# Patient Record
Sex: Male | Born: 1960
Health system: Southern US, Community
[De-identification: ages and names within clinical notes are randomized; demographics above are authoritative.]

## PROBLEM LIST (undated history)

## (undated) DIAGNOSIS — K219 Gastro-esophageal reflux disease without esophagitis: Secondary | ICD-10-CM

## (undated) DIAGNOSIS — R1310 Dysphagia, unspecified: Secondary | ICD-10-CM

## (undated) DIAGNOSIS — K08109 Complete loss of teeth, unspecified cause, unspecified class: Secondary | ICD-10-CM

## (undated) DIAGNOSIS — R0600 Dyspnea, unspecified: Secondary | ICD-10-CM

## (undated) DIAGNOSIS — J302 Other seasonal allergic rhinitis: Secondary | ICD-10-CM

## (undated) DIAGNOSIS — M1990 Unspecified osteoarthritis, unspecified site: Secondary | ICD-10-CM

## (undated) DIAGNOSIS — C801 Malignant (primary) neoplasm, unspecified: Secondary | ICD-10-CM

## (undated) DIAGNOSIS — M542 Cervicalgia: Secondary | ICD-10-CM

## (undated) DIAGNOSIS — R519 Headache, unspecified: Secondary | ICD-10-CM

## (undated) DIAGNOSIS — E039 Hypothyroidism, unspecified: Secondary | ICD-10-CM

## (undated) DIAGNOSIS — Z923 Personal history of irradiation: Secondary | ICD-10-CM

## (undated) DIAGNOSIS — I10 Essential (primary) hypertension: Secondary | ICD-10-CM

## (undated) DIAGNOSIS — Z9221 Personal history of antineoplastic chemotherapy: Secondary | ICD-10-CM

## (undated) DIAGNOSIS — F419 Anxiety disorder, unspecified: Secondary | ICD-10-CM

## (undated) DIAGNOSIS — E785 Hyperlipidemia, unspecified: Secondary | ICD-10-CM

## (undated) DIAGNOSIS — G4730 Sleep apnea, unspecified: Secondary | ICD-10-CM

## (undated) DIAGNOSIS — E119 Type 2 diabetes mellitus without complications: Secondary | ICD-10-CM

## (undated) DIAGNOSIS — Z972 Presence of dental prosthetic device (complete) (partial): Secondary | ICD-10-CM

## (undated) HISTORY — PX: SHOULDER OPEN ROTATOR CUFF REPAIR: SHX2407

## (undated) HISTORY — PX: PORTACATH PLACEMENT: SHX2246

## (undated) HISTORY — DX: Anxiety disorder, unspecified: F41.9

## (undated) HISTORY — DX: Essential (primary) hypertension: I10

## (undated) HISTORY — PX: SHOULDER ARTHROSCOPY: SHX128

## (undated) HISTORY — PX: MULTIPLE TOOTH EXTRACTIONS: SHX2053

## (undated) HISTORY — PX: LYMPHADENECTOMY: SHX15

## (undated) HISTORY — DX: Gastro-esophageal reflux disease without esophagitis: K21.9

## (undated) HISTORY — PX: PORT-A-CATH REMOVAL: SHX5289

## (undated) HISTORY — DX: Malignant (primary) neoplasm, unspecified: C80.1

## (undated) HISTORY — PX: KNEE ARTHROPLASTY: SHX992

## (undated) HISTORY — DX: Hyperlipidemia, unspecified: E78.5

## (undated) HISTORY — PX: KNEE ARTHROSCOPY: SUR90

---

## 1997-12-14 ENCOUNTER — Encounter: Admission: RE | Admit: 1997-12-14 | Discharge: 1998-03-14 | Payer: Self-pay | Admitting: Internal Medicine

## 1998-01-01 ENCOUNTER — Ambulatory Visit: Admission: RE | Admit: 1998-01-01 | Discharge: 1998-01-01 | Payer: Self-pay | Admitting: Pulmonary Disease

## 1998-02-26 ENCOUNTER — Ambulatory Visit: Admission: RE | Admit: 1998-02-26 | Discharge: 1998-02-26 | Payer: Self-pay | Admitting: Pulmonary Disease

## 2001-02-27 ENCOUNTER — Encounter: Payer: Self-pay | Admitting: Orthopedic Surgery

## 2001-03-03 ENCOUNTER — Ambulatory Visit (HOSPITAL_COMMUNITY): Admission: RE | Admit: 2001-03-03 | Discharge: 2001-03-04 | Payer: Self-pay | Admitting: Orthopedic Surgery

## 2002-05-07 HISTORY — PX: LAPAROSCOPIC GASTRIC BYPASS: SUR771

## 2002-07-06 ENCOUNTER — Encounter: Payer: Self-pay | Admitting: Surgery

## 2002-07-06 ENCOUNTER — Ambulatory Visit (HOSPITAL_COMMUNITY): Admission: RE | Admit: 2002-07-06 | Discharge: 2002-07-06 | Payer: Self-pay | Admitting: Surgery

## 2002-07-06 ENCOUNTER — Encounter: Admission: RE | Admit: 2002-07-06 | Discharge: 2002-10-04 | Payer: Self-pay | Admitting: Surgery

## 2002-07-24 ENCOUNTER — Ambulatory Visit (HOSPITAL_BASED_OUTPATIENT_CLINIC_OR_DEPARTMENT_OTHER): Admission: RE | Admit: 2002-07-24 | Discharge: 2002-07-24 | Payer: Self-pay | Admitting: Surgery

## 2002-10-12 ENCOUNTER — Encounter: Admission: RE | Admit: 2002-10-12 | Discharge: 2003-01-10 | Payer: Self-pay | Admitting: Surgery

## 2002-10-26 ENCOUNTER — Inpatient Hospital Stay (HOSPITAL_COMMUNITY): Admission: RE | Admit: 2002-10-26 | Discharge: 2002-10-30 | Payer: Self-pay | Admitting: Surgery

## 2002-10-26 ENCOUNTER — Encounter: Payer: Self-pay | Admitting: Surgery

## 2002-10-27 ENCOUNTER — Encounter: Payer: Self-pay | Admitting: Surgery

## 2003-01-19 ENCOUNTER — Encounter: Admission: RE | Admit: 2003-01-19 | Discharge: 2003-04-19 | Payer: Self-pay | Admitting: Surgery

## 2003-05-08 DIAGNOSIS — C801 Malignant (primary) neoplasm, unspecified: Secondary | ICD-10-CM

## 2003-05-08 HISTORY — DX: Malignant (primary) neoplasm, unspecified: C80.1

## 2003-05-10 ENCOUNTER — Encounter: Admission: RE | Admit: 2003-05-10 | Discharge: 2003-05-10 | Payer: Self-pay | Admitting: Internal Medicine

## 2003-05-18 ENCOUNTER — Ambulatory Visit (HOSPITAL_COMMUNITY): Admission: RE | Admit: 2003-05-18 | Discharge: 2003-05-18 | Payer: Self-pay | Admitting: Otolaryngology

## 2003-05-18 ENCOUNTER — Ambulatory Visit (HOSPITAL_BASED_OUTPATIENT_CLINIC_OR_DEPARTMENT_OTHER): Admission: RE | Admit: 2003-05-18 | Discharge: 2003-05-18 | Payer: Self-pay | Admitting: Otolaryngology

## 2003-05-18 ENCOUNTER — Encounter (INDEPENDENT_AMBULATORY_CARE_PROVIDER_SITE_OTHER): Payer: Self-pay | Admitting: *Deleted

## 2003-05-24 ENCOUNTER — Ambulatory Visit: Admission: RE | Admit: 2003-05-24 | Discharge: 2003-08-22 | Payer: Self-pay | Admitting: Radiation Oncology

## 2003-05-27 ENCOUNTER — Encounter: Admission: RE | Admit: 2003-05-27 | Discharge: 2003-05-27 | Payer: Self-pay | Admitting: Dentistry

## 2003-06-01 ENCOUNTER — Ambulatory Visit (HOSPITAL_COMMUNITY): Admission: RE | Admit: 2003-06-01 | Discharge: 2003-06-01 | Payer: Self-pay | Admitting: Otolaryngology

## 2003-06-11 ENCOUNTER — Ambulatory Visit (HOSPITAL_COMMUNITY): Admission: RE | Admit: 2003-06-11 | Discharge: 2003-06-11 | Payer: Self-pay | Admitting: Surgery

## 2003-06-11 ENCOUNTER — Ambulatory Visit (HOSPITAL_BASED_OUTPATIENT_CLINIC_OR_DEPARTMENT_OTHER): Admission: RE | Admit: 2003-06-11 | Discharge: 2003-06-11 | Payer: Self-pay | Admitting: Surgery

## 2003-08-25 ENCOUNTER — Ambulatory Visit (HOSPITAL_BASED_OUTPATIENT_CLINIC_OR_DEPARTMENT_OTHER): Admission: RE | Admit: 2003-08-25 | Discharge: 2003-08-25 | Payer: Self-pay | Admitting: Surgery

## 2003-08-26 ENCOUNTER — Ambulatory Visit: Admission: RE | Admit: 2003-08-26 | Discharge: 2003-08-26 | Payer: Self-pay | Admitting: Radiation Oncology

## 2003-09-09 ENCOUNTER — Ambulatory Visit: Admission: RE | Admit: 2003-09-09 | Discharge: 2003-09-09 | Payer: Self-pay | Admitting: Radiation Oncology

## 2003-09-16 ENCOUNTER — Ambulatory Visit: Admission: RE | Admit: 2003-09-16 | Discharge: 2003-09-16 | Payer: Self-pay | Admitting: Radiation Oncology

## 2003-09-20 ENCOUNTER — Ambulatory Visit: Admission: RE | Admit: 2003-09-20 | Discharge: 2003-09-20 | Payer: Self-pay | Admitting: Radiation Oncology

## 2003-09-20 ENCOUNTER — Encounter (HOSPITAL_COMMUNITY): Admission: RE | Admit: 2003-09-20 | Discharge: 2003-09-20 | Payer: Self-pay | Admitting: Radiation Oncology

## 2003-09-23 ENCOUNTER — Ambulatory Visit: Admission: RE | Admit: 2003-09-23 | Discharge: 2003-09-23 | Payer: Self-pay | Admitting: Radiation Oncology

## 2003-09-27 ENCOUNTER — Ambulatory Visit (HOSPITAL_COMMUNITY): Admission: RE | Admit: 2003-09-27 | Discharge: 2003-09-27 | Payer: Self-pay | Admitting: Radiation Oncology

## 2003-09-30 ENCOUNTER — Ambulatory Visit: Admission: RE | Admit: 2003-09-30 | Discharge: 2003-09-30 | Payer: Self-pay | Admitting: Radiation Oncology

## 2003-10-07 ENCOUNTER — Ambulatory Visit: Admission: RE | Admit: 2003-10-07 | Discharge: 2003-10-07 | Payer: Self-pay | Admitting: Radiation Oncology

## 2003-11-11 ENCOUNTER — Ambulatory Visit: Admission: RE | Admit: 2003-11-11 | Discharge: 2003-11-11 | Payer: Self-pay | Admitting: Radiation Oncology

## 2003-12-02 ENCOUNTER — Ambulatory Visit: Admission: RE | Admit: 2003-12-02 | Discharge: 2003-12-02 | Payer: Self-pay | Admitting: Radiation Oncology

## 2003-12-23 ENCOUNTER — Ambulatory Visit: Admission: RE | Admit: 2003-12-23 | Discharge: 2003-12-23 | Payer: Self-pay | Admitting: Radiation Oncology

## 2003-12-27 ENCOUNTER — Ambulatory Visit (HOSPITAL_COMMUNITY): Admission: RE | Admit: 2003-12-27 | Discharge: 2003-12-27 | Payer: Self-pay | Admitting: Hematology & Oncology

## 2004-01-13 ENCOUNTER — Ambulatory Visit: Admission: RE | Admit: 2004-01-13 | Discharge: 2004-01-13 | Payer: Self-pay | Admitting: Radiation Oncology

## 2004-01-13 ENCOUNTER — Ambulatory Visit: Payer: Self-pay | Admitting: Dentistry

## 2004-02-10 ENCOUNTER — Ambulatory Visit: Payer: Self-pay | Admitting: Dentistry

## 2004-02-10 ENCOUNTER — Ambulatory Visit: Admission: RE | Admit: 2004-02-10 | Discharge: 2004-02-10 | Payer: Self-pay | Admitting: Radiation Oncology

## 2004-03-13 ENCOUNTER — Ambulatory Visit (HOSPITAL_COMMUNITY): Admission: RE | Admit: 2004-03-13 | Discharge: 2004-03-13 | Payer: Self-pay | Admitting: Hematology & Oncology

## 2004-03-21 ENCOUNTER — Ambulatory Visit: Admission: RE | Admit: 2004-03-21 | Discharge: 2004-03-21 | Payer: Self-pay | Admitting: Radiation Oncology

## 2004-03-22 ENCOUNTER — Inpatient Hospital Stay (HOSPITAL_COMMUNITY): Admission: RE | Admit: 2004-03-22 | Discharge: 2004-03-25 | Payer: Self-pay | Admitting: Orthopedic Surgery

## 2004-03-22 HISTORY — PX: REPLACEMENT TOTAL KNEE: SUR1224

## 2004-04-04 ENCOUNTER — Ambulatory Visit: Payer: Self-pay | Admitting: Dentistry

## 2004-04-06 ENCOUNTER — Ambulatory Visit: Payer: Self-pay | Admitting: Hematology & Oncology

## 2004-05-11 ENCOUNTER — Ambulatory Visit: Admission: RE | Admit: 2004-05-11 | Discharge: 2004-05-11 | Payer: Self-pay | Admitting: Radiation Oncology

## 2004-05-18 ENCOUNTER — Ambulatory Visit: Admission: RE | Admit: 2004-05-18 | Discharge: 2004-05-18 | Payer: Self-pay | Admitting: Radiation Oncology

## 2004-06-07 ENCOUNTER — Ambulatory Visit (HOSPITAL_COMMUNITY): Admission: RE | Admit: 2004-06-07 | Discharge: 2004-06-07 | Payer: Self-pay | Admitting: Hematology & Oncology

## 2004-06-30 ENCOUNTER — Ambulatory Visit: Payer: Self-pay | Admitting: Hematology & Oncology

## 2004-07-04 ENCOUNTER — Ambulatory Visit: Payer: Self-pay | Admitting: Dentistry

## 2004-07-13 ENCOUNTER — Ambulatory Visit: Admission: RE | Admit: 2004-07-13 | Discharge: 2004-07-13 | Payer: Self-pay | Admitting: Radiation Oncology

## 2004-08-03 ENCOUNTER — Ambulatory Visit: Admission: RE | Admit: 2004-08-03 | Discharge: 2004-08-03 | Payer: Self-pay | Admitting: Radiation Oncology

## 2004-08-25 ENCOUNTER — Ambulatory Visit: Payer: Self-pay | Admitting: Internal Medicine

## 2004-09-12 ENCOUNTER — Ambulatory Visit: Payer: Self-pay | Admitting: Dentistry

## 2004-10-25 ENCOUNTER — Ambulatory Visit (HOSPITAL_COMMUNITY): Admission: RE | Admit: 2004-10-25 | Discharge: 2004-10-25 | Payer: Self-pay | Admitting: Hematology & Oncology

## 2004-11-01 ENCOUNTER — Ambulatory Visit: Payer: Self-pay | Admitting: Hematology & Oncology

## 2004-11-02 ENCOUNTER — Ambulatory Visit: Admission: RE | Admit: 2004-11-02 | Discharge: 2004-11-02 | Payer: Self-pay | Admitting: Radiation Oncology

## 2004-12-08 ENCOUNTER — Ambulatory Visit: Payer: Self-pay | Admitting: Dentistry

## 2005-01-23 ENCOUNTER — Ambulatory Visit: Payer: Self-pay | Admitting: Dentistry

## 2005-03-07 ENCOUNTER — Ambulatory Visit (HOSPITAL_COMMUNITY): Admission: RE | Admit: 2005-03-07 | Discharge: 2005-03-07 | Payer: Self-pay | Admitting: Hematology & Oncology

## 2005-03-20 ENCOUNTER — Ambulatory Visit: Payer: Self-pay | Admitting: Hematology & Oncology

## 2005-04-09 ENCOUNTER — Ambulatory Visit: Payer: Self-pay | Admitting: Dentistry

## 2005-06-04 ENCOUNTER — Ambulatory Visit: Payer: Self-pay | Admitting: Hematology & Oncology

## 2005-07-11 ENCOUNTER — Ambulatory Visit (HOSPITAL_COMMUNITY): Admission: RE | Admit: 2005-07-11 | Discharge: 2005-07-11 | Payer: Self-pay | Admitting: Hematology & Oncology

## 2005-07-24 ENCOUNTER — Ambulatory Visit: Payer: Self-pay | Admitting: Hematology & Oncology

## 2005-07-27 ENCOUNTER — Ambulatory Visit: Payer: Self-pay | Admitting: Dentistry

## 2005-11-09 ENCOUNTER — Ambulatory Visit (HOSPITAL_COMMUNITY): Admission: RE | Admit: 2005-11-09 | Discharge: 2005-11-09 | Payer: Self-pay | Admitting: Hematology & Oncology

## 2005-11-20 ENCOUNTER — Ambulatory Visit: Payer: Self-pay | Admitting: Hematology & Oncology

## 2005-11-23 LAB — COMPREHENSIVE METABOLIC PANEL
Albumin: 4 g/dL (ref 3.5–5.2)
BUN: 24 mg/dL — ABNORMAL HIGH (ref 6–23)
CO2: 25 mEq/L (ref 19–32)
Glucose, Bld: 102 mg/dL — ABNORMAL HIGH (ref 70–99)
Sodium: 139 mEq/L (ref 135–145)
Total Bilirubin: 0.4 mg/dL (ref 0.3–1.2)
Total Protein: 7.2 g/dL (ref 6.0–8.3)

## 2005-11-23 LAB — TSH: TSH: 4.767 u[IU]/mL (ref 0.350–5.500)

## 2005-11-23 LAB — CBC WITH DIFFERENTIAL/PLATELET
Basophils Absolute: 0 10*3/uL (ref 0.0–0.1)
Eosinophils Absolute: 0.1 10*3/uL (ref 0.0–0.5)
HCT: 41.5 % (ref 38.7–49.9)
HGB: 14.1 g/dL (ref 13.0–17.1)
LYMPH%: 25.4 % (ref 14.0–48.0)
MONO#: 0.5 10*3/uL (ref 0.1–0.9)
NEUT#: 5.4 10*3/uL (ref 1.5–6.5)
Platelets: 231 10*3/uL (ref 145–400)
RBC: 4.71 10*6/uL (ref 4.20–5.71)
WBC: 8.1 10*3/uL (ref 4.0–10.0)

## 2005-12-14 ENCOUNTER — Ambulatory Visit: Payer: Self-pay | Admitting: Dentistry

## 2006-01-28 ENCOUNTER — Encounter: Admission: RE | Admit: 2006-01-28 | Discharge: 2006-01-28 | Payer: Self-pay | Admitting: Surgery

## 2006-01-29 ENCOUNTER — Encounter: Admission: RE | Admit: 2006-01-29 | Discharge: 2006-01-29 | Payer: Self-pay | Admitting: Surgery

## 2006-03-21 ENCOUNTER — Ambulatory Visit: Payer: Self-pay | Admitting: Internal Medicine

## 2006-03-22 ENCOUNTER — Ambulatory Visit: Payer: Self-pay | Admitting: Dentistry

## 2006-04-19 ENCOUNTER — Ambulatory Visit (HOSPITAL_COMMUNITY): Admission: RE | Admit: 2006-04-19 | Discharge: 2006-04-19 | Payer: Self-pay | Admitting: Hematology & Oncology

## 2006-05-01 ENCOUNTER — Ambulatory Visit: Payer: Self-pay | Admitting: Hematology & Oncology

## 2006-05-06 LAB — CBC WITH DIFFERENTIAL/PLATELET
Basophils Absolute: 0.1 10*3/uL (ref 0.0–0.1)
Eosinophils Absolute: 0.2 10*3/uL (ref 0.0–0.5)
HGB: 14.4 g/dL (ref 13.0–17.1)
LYMPH%: 32.1 % (ref 14.0–48.0)
MCV: 87.3 fL (ref 81.6–98.0)
MONO%: 8.4 % (ref 0.0–13.0)
NEUT#: 3.7 10*3/uL (ref 1.5–6.5)
Platelets: 184 10*3/uL (ref 145–400)

## 2006-05-06 LAB — COMPREHENSIVE METABOLIC PANEL
AST: 19 U/L (ref 0–37)
Albumin: 3.9 g/dL (ref 3.5–5.2)
Alkaline Phosphatase: 67 U/L (ref 39–117)
BUN: 18 mg/dL (ref 6–23)
Creatinine, Ser: 1.15 mg/dL (ref 0.40–1.50)
Glucose, Bld: 103 mg/dL — ABNORMAL HIGH (ref 70–99)
Total Bilirubin: 0.4 mg/dL (ref 0.3–1.2)

## 2006-05-13 ENCOUNTER — Ambulatory Visit: Payer: Self-pay | Admitting: Dentistry

## 2006-06-14 ENCOUNTER — Ambulatory Visit: Payer: Self-pay | Admitting: Hematology & Oncology

## 2006-06-18 LAB — TSH: TSH: 4.769 u[IU]/mL (ref 0.350–5.500)

## 2006-10-04 ENCOUNTER — Ambulatory Visit: Payer: Self-pay | Admitting: Dentistry

## 2006-10-28 ENCOUNTER — Ambulatory Visit: Payer: Self-pay | Admitting: Dentistry

## 2006-10-30 ENCOUNTER — Ambulatory Visit: Payer: Self-pay | Admitting: Hematology & Oncology

## 2006-10-30 ENCOUNTER — Ambulatory Visit (HOSPITAL_COMMUNITY): Admission: RE | Admit: 2006-10-30 | Discharge: 2006-10-30 | Payer: Self-pay | Admitting: Hematology & Oncology

## 2006-10-30 ENCOUNTER — Encounter: Payer: Self-pay | Admitting: Internal Medicine

## 2006-11-04 ENCOUNTER — Encounter: Payer: Self-pay | Admitting: Internal Medicine

## 2006-11-04 LAB — COMPREHENSIVE METABOLIC PANEL
Albumin: 4.1 g/dL (ref 3.5–5.2)
Alkaline Phosphatase: 73 U/L (ref 39–117)
CO2: 26 mEq/L (ref 19–32)
Glucose, Bld: 109 mg/dL — ABNORMAL HIGH (ref 70–99)
Potassium: 4.9 mEq/L (ref 3.5–5.3)
Sodium: 140 mEq/L (ref 135–145)
Total Protein: 7.5 g/dL (ref 6.0–8.3)

## 2006-11-04 LAB — CBC WITH DIFFERENTIAL/PLATELET
Eosinophils Absolute: 0.1 10*3/uL (ref 0.0–0.5)
MONO#: 0.5 10*3/uL (ref 0.1–0.9)
MONO%: 8.7 % (ref 0.0–13.0)
NEUT#: 3.2 10*3/uL (ref 1.5–6.5)
RBC: 4.92 10*6/uL (ref 4.20–5.71)
RDW: 14.8 % — ABNORMAL HIGH (ref 11.2–14.6)
WBC: 6.2 10*3/uL (ref 4.0–10.0)
lymph#: 2.3 10*3/uL (ref 0.9–3.3)

## 2006-11-11 ENCOUNTER — Ambulatory Visit: Payer: Self-pay | Admitting: Dentistry

## 2006-11-18 ENCOUNTER — Ambulatory Visit: Payer: Self-pay | Admitting: Dentistry

## 2007-01-09 ENCOUNTER — Ambulatory Visit: Payer: Self-pay | Admitting: Hematology & Oncology

## 2007-01-13 LAB — COMPREHENSIVE METABOLIC PANEL
ALT: 20 U/L (ref 0–53)
AST: 16 U/L (ref 0–37)
Alkaline Phosphatase: 65 U/L (ref 39–117)
CO2: 24 mEq/L (ref 19–32)
Sodium: 139 mEq/L (ref 135–145)
Total Bilirubin: 0.5 mg/dL (ref 0.3–1.2)
Total Protein: 7.3 g/dL (ref 6.0–8.3)

## 2007-01-13 LAB — CBC WITH DIFFERENTIAL/PLATELET
BASO%: 0.5 % (ref 0.0–2.0)
EOS%: 1.1 % (ref 0.0–7.0)
LYMPH%: 33.2 % (ref 14.0–48.0)
MCHC: 34.3 g/dL (ref 32.0–35.9)
MONO#: 0.7 10*3/uL (ref 0.1–0.9)
MONO%: 10 % (ref 0.0–13.0)
Platelets: 176 10*3/uL (ref 145–400)
RBC: 4.71 10*6/uL (ref 4.20–5.71)
WBC: 6.8 10*3/uL (ref 4.0–10.0)

## 2007-04-14 ENCOUNTER — Ambulatory Visit (HOSPITAL_COMMUNITY): Admission: RE | Admit: 2007-04-14 | Discharge: 2007-04-14 | Payer: Self-pay | Admitting: Hematology & Oncology

## 2007-04-17 ENCOUNTER — Ambulatory Visit: Payer: Self-pay | Admitting: Hematology & Oncology

## 2007-04-21 ENCOUNTER — Encounter: Payer: Self-pay | Admitting: Internal Medicine

## 2007-04-21 LAB — CBC WITH DIFFERENTIAL/PLATELET
BASO%: 0.6 % (ref 0.0–2.0)
Basophils Absolute: 0 10*3/uL (ref 0.0–0.1)
EOS%: 2.2 % (ref 0.0–7.0)
HCT: 42.5 % (ref 38.7–49.9)
HGB: 14.7 g/dL (ref 13.0–17.1)
LYMPH%: 38 % (ref 14.0–48.0)
MCH: 30.3 pg (ref 28.0–33.4)
MCHC: 34.7 g/dL (ref 32.0–35.9)
MCV: 87.4 fL (ref 81.6–98.0)
MONO%: 8.5 % (ref 0.0–13.0)
NEUT%: 50.7 % (ref 40.0–75.0)
Platelets: 187 10*3/uL (ref 145–400)
lymph#: 2.5 10*3/uL (ref 0.9–3.3)

## 2007-04-21 LAB — COMPREHENSIVE METABOLIC PANEL
AST: 16 U/L (ref 0–37)
BUN: 16 mg/dL (ref 6–23)
Calcium: 9.1 mg/dL (ref 8.4–10.5)
Chloride: 105 mEq/L (ref 96–112)
Creatinine, Ser: 1.12 mg/dL (ref 0.40–1.50)
Total Bilirubin: 0.4 mg/dL (ref 0.3–1.2)

## 2007-04-22 ENCOUNTER — Ambulatory Visit: Payer: Self-pay | Admitting: Dentistry

## 2007-06-09 ENCOUNTER — Ambulatory Visit: Payer: Self-pay | Admitting: Dentistry

## 2007-06-25 ENCOUNTER — Encounter: Payer: Self-pay | Admitting: Internal Medicine

## 2007-08-20 ENCOUNTER — Ambulatory Visit: Payer: Self-pay | Admitting: Dentistry

## 2007-10-09 ENCOUNTER — Ambulatory Visit (HOSPITAL_COMMUNITY): Admission: RE | Admit: 2007-10-09 | Discharge: 2007-10-09 | Payer: Self-pay | Admitting: Hematology & Oncology

## 2007-10-14 ENCOUNTER — Ambulatory Visit: Payer: Self-pay | Admitting: Hematology & Oncology

## 2007-10-16 ENCOUNTER — Encounter: Payer: Self-pay | Admitting: Internal Medicine

## 2007-10-16 LAB — COMPREHENSIVE METABOLIC PANEL
AST: 19 U/L (ref 0–37)
Alkaline Phosphatase: 60 U/L (ref 39–117)
BUN: 21 mg/dL (ref 6–23)
Creatinine, Ser: 1.15 mg/dL (ref 0.40–1.50)
Total Bilirubin: 0.6 mg/dL (ref 0.3–1.2)

## 2007-10-16 LAB — CBC WITH DIFFERENTIAL/PLATELET
Basophils Absolute: 0 10*3/uL (ref 0.0–0.1)
EOS%: 1.4 % (ref 0.0–7.0)
HCT: 44.5 % (ref 38.7–49.9)
HGB: 15.1 g/dL (ref 13.0–17.1)
MCH: 30 pg (ref 28.0–33.4)
MCV: 88.5 fL (ref 81.6–98.0)
MONO%: 7.9 % (ref 0.0–13.0)
NEUT%: 54.5 % (ref 40.0–75.0)
RDW: 14.2 % (ref 11.2–14.6)

## 2007-12-31 ENCOUNTER — Ambulatory Visit: Payer: Self-pay | Admitting: Dentistry

## 2008-03-02 ENCOUNTER — Ambulatory Visit: Payer: Self-pay | Admitting: Dentistry

## 2008-03-22 ENCOUNTER — Ambulatory Visit: Payer: Self-pay | Admitting: Dentistry

## 2008-03-23 ENCOUNTER — Ambulatory Visit: Payer: Self-pay | Admitting: Internal Medicine

## 2008-03-23 DIAGNOSIS — I10 Essential (primary) hypertension: Secondary | ICD-10-CM | POA: Insufficient documentation

## 2008-03-23 DIAGNOSIS — E8881 Metabolic syndrome: Secondary | ICD-10-CM | POA: Insufficient documentation

## 2008-03-23 DIAGNOSIS — E785 Hyperlipidemia, unspecified: Secondary | ICD-10-CM | POA: Insufficient documentation

## 2008-03-23 DIAGNOSIS — E1159 Type 2 diabetes mellitus with other circulatory complications: Secondary | ICD-10-CM | POA: Insufficient documentation

## 2008-03-23 DIAGNOSIS — E039 Hypothyroidism, unspecified: Secondary | ICD-10-CM | POA: Insufficient documentation

## 2008-03-23 DIAGNOSIS — E1169 Type 2 diabetes mellitus with other specified complication: Secondary | ICD-10-CM | POA: Insufficient documentation

## 2008-03-30 ENCOUNTER — Encounter (INDEPENDENT_AMBULATORY_CARE_PROVIDER_SITE_OTHER): Payer: Self-pay | Admitting: *Deleted

## 2008-04-08 ENCOUNTER — Ambulatory Visit: Payer: Self-pay | Admitting: Internal Medicine

## 2008-04-27 ENCOUNTER — Ambulatory Visit: Payer: Self-pay | Admitting: Hematology & Oncology

## 2008-04-28 ENCOUNTER — Encounter: Payer: Self-pay | Admitting: Internal Medicine

## 2008-04-28 LAB — COMPREHENSIVE METABOLIC PANEL
ALT: 21 U/L (ref 0–53)
AST: 15 U/L (ref 0–37)
Calcium: 9 mg/dL (ref 8.4–10.5)
Chloride: 102 mEq/L (ref 96–112)
Creatinine, Ser: 1.15 mg/dL (ref 0.40–1.50)
Potassium: 4.7 mEq/L (ref 3.5–5.3)
Sodium: 137 mEq/L (ref 135–145)

## 2008-04-28 LAB — CBC WITH DIFFERENTIAL (CANCER CENTER ONLY)
BASO%: 0.6 % (ref 0.0–2.0)
EOS%: 1.6 % (ref 0.0–7.0)
LYMPH%: 31.3 % (ref 14.0–48.0)
MCHC: 33.5 g/dL (ref 32.0–35.9)
MCV: 86 fL (ref 82–98)
MONO#: 0.5 10*3/uL (ref 0.1–0.9)
NEUT%: 59.5 % (ref 40.0–80.0)
Platelets: 154 10*3/uL (ref 145–400)
RDW: 13.3 % (ref 10.5–14.6)
WBC: 7.5 10*3/uL (ref 4.0–10.0)

## 2008-06-17 ENCOUNTER — Ambulatory Visit: Payer: Self-pay | Admitting: Internal Medicine

## 2008-06-17 LAB — CONVERTED CEMR LAB
BUN: 13 mg/dL (ref 6–23)
Hgb A1c MFr Bld: 6.1 % — ABNORMAL HIGH (ref 4.6–6.0)
LDL Cholesterol: 123 mg/dL — ABNORMAL HIGH (ref 0–99)
Potassium: 4.4 meq/L (ref 3.5–5.1)
Total CHOL/HDL Ratio: 5.6

## 2008-06-24 ENCOUNTER — Ambulatory Visit: Payer: Self-pay | Admitting: Internal Medicine

## 2008-07-22 ENCOUNTER — Telehealth (INDEPENDENT_AMBULATORY_CARE_PROVIDER_SITE_OTHER): Payer: Self-pay | Admitting: *Deleted

## 2008-07-22 ENCOUNTER — Ambulatory Visit: Payer: Self-pay | Admitting: Internal Medicine

## 2008-07-22 LAB — CONVERTED CEMR LAB
Nitrite: NEGATIVE
Protein, U semiquant: NEGATIVE
Urobilinogen, UA: 0.2
WBC Urine, dipstick: NEGATIVE

## 2008-07-28 ENCOUNTER — Ambulatory Visit: Payer: Self-pay | Admitting: Dentistry

## 2008-08-27 ENCOUNTER — Telehealth (INDEPENDENT_AMBULATORY_CARE_PROVIDER_SITE_OTHER): Payer: Self-pay | Admitting: *Deleted

## 2008-09-21 ENCOUNTER — Telehealth (INDEPENDENT_AMBULATORY_CARE_PROVIDER_SITE_OTHER): Payer: Self-pay | Admitting: *Deleted

## 2008-10-12 ENCOUNTER — Ambulatory Visit: Payer: Self-pay | Admitting: Hematology & Oncology

## 2008-10-14 ENCOUNTER — Encounter: Payer: Self-pay | Admitting: Internal Medicine

## 2008-10-14 LAB — COMPREHENSIVE METABOLIC PANEL
ALT: 24 U/L (ref 0–53)
AST: 17 U/L (ref 0–37)
Albumin: 4.1 g/dL (ref 3.5–5.2)
Calcium: 9.9 mg/dL (ref 8.4–10.5)
Chloride: 103 mEq/L (ref 96–112)
Creatinine, Ser: 1.25 mg/dL (ref 0.40–1.50)
Potassium: 5.1 mEq/L (ref 3.5–5.3)

## 2008-10-14 LAB — CBC WITH DIFFERENTIAL (CANCER CENTER ONLY)
BASO#: 0 10*3/uL (ref 0.0–0.2)
Eosinophils Absolute: 0.1 10*3/uL (ref 0.0–0.5)
HCT: 46.2 % (ref 38.7–49.9)
HGB: 15.3 g/dL (ref 13.0–17.1)
LYMPH#: 2.7 10*3/uL (ref 0.9–3.3)
MCH: 29.6 pg (ref 28.0–33.4)
MONO%: 7.5 % (ref 0.0–13.0)
NEUT#: 3.7 10*3/uL (ref 1.5–6.5)
NEUT%: 51.8 % (ref 40.0–80.0)
RBC: 5.16 10*6/uL (ref 4.20–5.70)

## 2008-12-13 ENCOUNTER — Ambulatory Visit: Payer: Self-pay | Admitting: Dentistry

## 2008-12-22 ENCOUNTER — Ambulatory Visit: Payer: Self-pay | Admitting: Internal Medicine

## 2008-12-24 ENCOUNTER — Telehealth (INDEPENDENT_AMBULATORY_CARE_PROVIDER_SITE_OTHER): Payer: Self-pay | Admitting: *Deleted

## 2008-12-26 LAB — CONVERTED CEMR LAB
Cholesterol: 202 mg/dL — ABNORMAL HIGH (ref 0–200)
Direct LDL: 144.8 mg/dL
Hgb A1c MFr Bld: 5.9 % (ref 4.6–6.5)
TSH: 2.79 microintl units/mL (ref 0.35–5.50)
Total CHOL/HDL Ratio: 5
VLDL: 25.6 mg/dL (ref 0.0–40.0)

## 2008-12-27 ENCOUNTER — Encounter (INDEPENDENT_AMBULATORY_CARE_PROVIDER_SITE_OTHER): Payer: Self-pay | Admitting: *Deleted

## 2008-12-29 ENCOUNTER — Ambulatory Visit: Payer: Self-pay | Admitting: Dentistry

## 2009-01-13 ENCOUNTER — Encounter: Admission: RE | Admit: 2009-01-13 | Discharge: 2009-01-13 | Payer: Self-pay | Admitting: Orthopedic Surgery

## 2009-01-18 ENCOUNTER — Ambulatory Visit: Payer: Self-pay | Admitting: Internal Medicine

## 2009-01-18 DIAGNOSIS — K219 Gastro-esophageal reflux disease without esophagitis: Secondary | ICD-10-CM | POA: Insufficient documentation

## 2009-01-18 DIAGNOSIS — N400 Enlarged prostate without lower urinary tract symptoms: Secondary | ICD-10-CM | POA: Insufficient documentation

## 2009-01-18 DIAGNOSIS — R351 Nocturia: Secondary | ICD-10-CM | POA: Insufficient documentation

## 2009-01-20 ENCOUNTER — Encounter (INDEPENDENT_AMBULATORY_CARE_PROVIDER_SITE_OTHER): Payer: Self-pay | Admitting: *Deleted

## 2009-02-21 ENCOUNTER — Ambulatory Visit: Payer: Self-pay | Admitting: Family Medicine

## 2009-02-21 LAB — CONVERTED CEMR LAB
Ketones, urine, test strip: NEGATIVE
Nitrite: NEGATIVE
Urobilinogen, UA: 1
pH: 6.5

## 2009-02-22 ENCOUNTER — Encounter: Payer: Self-pay | Admitting: Family Medicine

## 2009-02-22 LAB — CONVERTED CEMR LAB

## 2009-02-24 ENCOUNTER — Telehealth (INDEPENDENT_AMBULATORY_CARE_PROVIDER_SITE_OTHER): Payer: Self-pay | Admitting: *Deleted

## 2009-03-02 ENCOUNTER — Ambulatory Visit: Payer: Self-pay | Admitting: Dentistry

## 2009-04-19 ENCOUNTER — Ambulatory Visit: Payer: Self-pay | Admitting: Internal Medicine

## 2009-04-26 ENCOUNTER — Ambulatory Visit: Payer: Self-pay | Admitting: Internal Medicine

## 2009-04-28 ENCOUNTER — Telehealth (INDEPENDENT_AMBULATORY_CARE_PROVIDER_SITE_OTHER): Payer: Self-pay | Admitting: *Deleted

## 2009-06-13 ENCOUNTER — Ambulatory Visit: Payer: Self-pay | Admitting: Dentistry

## 2009-06-29 ENCOUNTER — Telehealth (INDEPENDENT_AMBULATORY_CARE_PROVIDER_SITE_OTHER): Payer: Self-pay | Admitting: *Deleted

## 2009-07-27 ENCOUNTER — Ambulatory Visit: Payer: Self-pay | Admitting: Dentistry

## 2009-08-05 ENCOUNTER — Telehealth (INDEPENDENT_AMBULATORY_CARE_PROVIDER_SITE_OTHER): Payer: Self-pay | Admitting: *Deleted

## 2009-08-29 ENCOUNTER — Telehealth (INDEPENDENT_AMBULATORY_CARE_PROVIDER_SITE_OTHER): Payer: Self-pay | Admitting: *Deleted

## 2009-09-01 ENCOUNTER — Ambulatory Visit: Payer: Self-pay | Admitting: Internal Medicine

## 2009-09-05 LAB — CONVERTED CEMR LAB
BUN: 15 mg/dL (ref 6–23)
Cholesterol: 148 mg/dL (ref 0–200)
Creatinine, Ser: 1.2 mg/dL (ref 0.4–1.5)
Creatinine,U: 162.8 mg/dL
LDL Cholesterol: 79 mg/dL (ref 0–99)
Microalb Creat Ratio: 3.7 mg/g (ref 0.0–30.0)
Microalb, Ur: 0.6 mg/dL (ref 0.0–1.9)
Triglycerides: 125 mg/dL (ref 0.0–149.0)

## 2009-09-22 ENCOUNTER — Telehealth (INDEPENDENT_AMBULATORY_CARE_PROVIDER_SITE_OTHER): Payer: Self-pay | Admitting: *Deleted

## 2009-10-11 ENCOUNTER — Ambulatory Visit: Payer: Self-pay | Admitting: Dentistry

## 2009-11-09 ENCOUNTER — Telehealth (INDEPENDENT_AMBULATORY_CARE_PROVIDER_SITE_OTHER): Payer: Self-pay | Admitting: *Deleted

## 2009-11-10 ENCOUNTER — Telehealth (INDEPENDENT_AMBULATORY_CARE_PROVIDER_SITE_OTHER): Payer: Self-pay | Admitting: *Deleted

## 2009-11-28 ENCOUNTER — Telehealth (INDEPENDENT_AMBULATORY_CARE_PROVIDER_SITE_OTHER): Payer: Self-pay | Admitting: *Deleted

## 2010-01-10 ENCOUNTER — Telehealth (INDEPENDENT_AMBULATORY_CARE_PROVIDER_SITE_OTHER): Payer: Self-pay | Admitting: *Deleted

## 2010-04-03 ENCOUNTER — Telehealth (INDEPENDENT_AMBULATORY_CARE_PROVIDER_SITE_OTHER): Payer: Self-pay | Admitting: *Deleted

## 2010-05-07 HISTORY — PX: COLONOSCOPY: SHX174

## 2010-05-18 ENCOUNTER — Telehealth (INDEPENDENT_AMBULATORY_CARE_PROVIDER_SITE_OTHER): Payer: Self-pay | Admitting: *Deleted

## 2010-05-28 ENCOUNTER — Encounter: Payer: Self-pay | Admitting: Hematology & Oncology

## 2010-06-04 LAB — CONVERTED CEMR LAB
AST: 20 units/L (ref 0–37)
Albumin: 3.6 g/dL (ref 3.5–5.2)
Alkaline Phosphatase: 61 units/L (ref 39–117)
BUN: 20 mg/dL (ref 6–23)
Microalb Creat Ratio: 1.9 mg/g (ref 0.0–30.0)
Microalb, Ur: 0.2 mg/dL (ref 0.0–1.9)
Potassium: 4.3 meq/L (ref 3.5–5.1)

## 2010-06-05 ENCOUNTER — Telehealth (INDEPENDENT_AMBULATORY_CARE_PROVIDER_SITE_OTHER): Payer: Self-pay | Admitting: *Deleted

## 2010-06-06 NOTE — Progress Notes (Signed)
Summary: Refill Request  Phone Note Refill Request Call back at [phone] Message from:  Pharmacy on Sep 22, 2009 9:08 AM  Refills Requested: Medication #1:  AMLODIPINE BESY-BENAZEPRIL HCL 5-20 MG CAPS 1 once daily   Dosage confirmed as above?Dosage Confirmed   Supply Requested: 1 month   Last Refilled: 08/23/2009 [facility]  Next Appointment Scheduled: none Initial call taken by: Harold Barban,  Sep 22, 2009 9:08 AM    Prescriptions: AMLODIPINE BESY-BENAZEPRIL HCL 5-20 MG CAPS (AMLODIPINE BESY-BENAZEPRIL HCL) 1 once daily  #30 x 5   Entered by:   Shonna Chock   Authorized by:   Marga Melnick MD   Signed by:   Shonna Chock on 09/22/2009   Method used:   Electronically to        [facility]* (retail)       [address] [place], [place]  [address]       Ph: [phone] or [phone]       Fax: [phone]   RxID:   [id number]

## 2010-06-06 NOTE — Progress Notes (Signed)
Summary: REFILL  Phone Note Refill Request Message from:  Patient on April 03, 2010 9:27 AM  Refills Requested: Medication #1:  AMLODIPINE BESY-BENAZEPRIL HCL 5-20 MG CAPS 1 once daily   Dosage confirmed as above?Dosage Confirmed   Supply Requested: 1 month   Last Refilled: 02/24/2010 [facility]  Next Appointment Scheduled: 1.5.12 Initial call taken by: Lavell Islam,  April 03, 2010 9:27 AM    Prescriptions: AMLODIPINE BESY-BENAZEPRIL HCL 5-20 MG CAPS (AMLODIPINE BESY-BENAZEPRIL HCL) 1 once daily  #30 x 1   Entered by:   Shonna Chock CMA   Authorized by:   Marga Melnick MD   Signed by:   Shonna Chock CMA on 04/03/2010   Method used:   Electronically to        [facility]* (retail)       [address] [place], [place]  [address]       Ph: [phone] or [phone]       Fax: [phone]   RxID:   [id number]

## 2010-06-06 NOTE — Progress Notes (Signed)
Summary: REFILL REQUEST  Phone Note Refill Request Call back at [phone] Message from:  Pharmacy on November 09, 2009 8:55 AM  Refills Requested: Medication #1:  PRAVASTATIN SODIUM 20 MG TABS 1 at bedtime   Dosage confirmed as above?Dosage Confirmed   Supply Requested: 3 months   Last Refilled: 08/05/2009 [facility]  Next Appointment Scheduled: NONE Initial call taken by: Lavell Islam,  November 09, 2009 8:55 AM    Prescriptions: PRAVASTATIN SODIUM 20 MG TABS (PRAVASTATIN SODIUM) 1 at bedtime, APPOINTMENT DUE  #90 x 3   Entered by:   Shonna Chock   Authorized by:   Marga Melnick MD   Signed by:   Shonna Chock on 11/09/2009   Method used:   Electronically to        [facility]* (retail)       [address] [place], [place]  [address]       Ph: [phone] or [phone]       Fax: [phone]   RxID:   [id number]

## 2010-06-06 NOTE — Letter (Signed)
Summary: Results Follow up Letter  [facility]  [address] [place], [place] [address]   Phone: [phone]  Fax: [phone]    03/30/2008 MRN: [id number]  [facility] [address] [place], [place]  [address]  Dear Mr. Rackers,  The following are the results of your recent test(s):  Test         Result    Pap Smear:        Normal _____  Not Normal _____ Comments: ______________________________________________________ Cholesterol: LDL(Bad cholesterol):         Your goal is less than:         HDL (Good cholesterol):       Your goal is more than: Comments:  ______________________________________________________ Mammogram:        Normal _____  Not Normal _____ Comments:  ___________________________________________________________________ Hemoccult:        Normal _____  Not normal _______ Comments:    _____________________________________________________________________ Other Tests: PLEASE SEE ATTACHED LABS FROM 03/23/08 AND COMMENTS    We routinely do not discuss normal results over the telephone.  If you desire a copy of the results, or you have any questions about this information we can discuss them at your next office visit.   Sincerely,

## 2010-06-06 NOTE — Progress Notes (Signed)
Summary: Refill Request  Phone Note Refill Request Call back at [phone] Message from:  Pharmacy on November 28, 2009 3:32 PM  Refills Requested: Medication #1:  PANTOPRAZOLE SODIUM 40 MG TBEC 1 once daily as needed   Dosage confirmed as above?Dosage Confirmed   Supply Requested: 3 months   Last Refilled: 08/29/2009 [facility]  Next Appointment Scheduled: none Initial call taken by: Lavell Islam,  November 28, 2009 3:33 PM    Prescriptions: PANTOPRAZOLE SODIUM 40 MG TBEC (PANTOPRAZOLE SODIUM) 1 once daily as needed  #90 x 2   Entered by:   Shonna Chock CMA   Authorized by:   Marga Melnick MD   Signed by:   Shonna Chock CMA on 11/28/2009   Method used:   Electronically to        [facility]* (retail)       [address] [place], [place]  [address]       Ph: [phone] or [phone]       Fax: [phone]   RxID:   [id number]

## 2010-06-06 NOTE — Progress Notes (Signed)
Summary: Refill Request  Phone Note Refill Request Message from:  Pharmacy on [facility] Fax #: [phone]  Refills Requested: Medication #1:  PRAVASTATIN SODIUM 20 MG TABS 1 at bedtime.   Dosage confirmed as above?Dosage Confirmed   Supply Requested: 3 months   Last Refilled: 04/28/2009 Next Appointment Scheduled: none Initial call taken by: Harold Barban,  August 05, 2009 8:59 AM    New/Updated Medications: PRAVASTATIN SODIUM 20 MG TABS (PRAVASTATIN SODIUM) 1 at bedtime, APPOINTMENT DUE Prescriptions: PRAVASTATIN SODIUM 20 MG TABS (PRAVASTATIN SODIUM) 1 at bedtime, APPOINTMENT DUE  #90 x 0   Entered by:   Shonna Chock   Authorized by:   Marga Melnick MD   Signed by:   Shonna Chock on 08/05/2009   Method used:   Electronically to        [facility]* (retail)       [address] [place], [place]  [address]       Ph: [phone] or [phone]       Fax: [phone]   RxID:   [id number]

## 2010-06-06 NOTE — Progress Notes (Signed)
Summary: labs scheduled [phone]  Phone Note Refill Request Message from:  Pharmacy on [facility] Fax #: [phone]  Refills Requested: Medication #1:  PANTOPRAZOLE SODIUM 40 MG TBEC 1 once daily as needed   Dosage confirmed as above?Dosage Confirmed   Supply Requested: 3 months   Last Refilled: 05/24/2009 Next Appointment Scheduled: none Initial call taken by: Harold Barban,  August 29, 2009 9:16 AM  Follow-up for Phone Call        Based on appointment from 04/2009 patient was to follow-up in 4 months-April 2011 (no pending appointment), Please call and schedule a follow-up appointment and labs prior (see OV from 04/2009)  Follow-up by: Shonna Chock,  August 29, 2009 10:43 AM    New/Updated Medications: PANTOPRAZOLE SODIUM 40 MG TBEC (PANTOPRAZOLE SODIUM) 1 once daily as needed Prescriptions: PANTOPRAZOLE SODIUM 40 MG TBEC (PANTOPRAZOLE SODIUM) 1 once daily as needed  #90 x 0   Entered by:   Shonna Chock   Authorized by:   Marga Melnick MD   Signed by:   Shonna Chock on 08/29/2009   Method used:   Electronically to        [facility]* (retail)       [address] [place], [place]  [address]       Ph: [phone] or [phone]       Fax: [phone]   RxID:   [id number]

## 2010-06-06 NOTE — Progress Notes (Signed)
Summary: Refill Request  Phone Note Refill Request Message from:  Pharmacy on [facility] Fax #: [phone]  Refills Requested: Medication #1:  BUDEPRION XL 150 MG XR24H-TAB 2 by mouth once daily   Dosage confirmed as above?Dosage Confirmed   Supply Requested: 1 month   Last Refilled: 05/24/2009 Initial call taken by: Harold Barban,  June 29, 2009 10:13 AM    Prescriptions: BUDEPRION XL 150 MG XR24H-TAB (BUPROPION HCL) 2 by mouth once daily  #60 x 5   Entered by:   Shonna Chock   Authorized by:   Marga Melnick MD   Signed by:   Shonna Chock on 06/29/2009   Method used:   Electronically to        [facility]* (retail)       [address] [place], [place]  [address]       Ph: [phone] or [phone]       Fax: [phone]   RxID:   [id number]

## 2010-06-06 NOTE — Progress Notes (Signed)
Summary: refill  Phone Note Refill Request Message from:  Fax from Pharmacy on January 10, 2010 4:07 PM  Refills Requested: Medication #1:  BUDEPRION XL 150 MG XR24H-TAB 2 by mouth once daily [facility] - fax [phone]  Initial call taken by: Okey Regal [place],  January 10, 2010 4:08 PM  Follow-up for Phone Call        Called the pharmacy to cancel RX for Levothyroid, sent in error Follow-up by: Shonna Chock CMA,  January 10, 2010 4:18 PM    Prescriptions: BUDEPRION XL 150 MG XR24H-TAB (BUPROPION HCL) 2 by mouth once daily  #60 x 3   Entered by:   Shonna Chock CMA   Authorized by:   Marga Melnick MD   Signed by:   Shonna Chock CMA on 01/10/2010   Method used:   Electronically to        [facility]* (retail)       [address] [place], [place]  [address]       Ph: [phone] or [phone]       Fax: [phone]   RxID:   [id number] LEVOTHROID 175 MCG TABS (LEVOTHYROXINE SODIUM) 1 by mouth once daily  #30 x 3   Entered by:   Shonna Chock CMA   Authorized by:   Marga Melnick MD   Signed by:   Shonna Chock CMA on 01/10/2010   Method used:   Electronically to        [facility]* (retail)       [address] [place], [place]  [address]       Ph: [phone] or [phone]       Fax: [phone]   RxID:   [id number]

## 2010-06-06 NOTE — Progress Notes (Signed)
Summary: refill  Phone Note Refill Request Message from:  Fax from Pharmacy on November 10, 2009 11:29 AM  Refills Requested: Medication #1:  LEVOTHROID 175 MCG TABS 1 by mouth once daily [facility] - fax [phone] Jani Files [phone]  Next Appointment Scheduled: none Initial call taken by: Okey Regal Spring,  November 10, 2009 11:30 AM    Prescriptions: LEVOTHROID 175 MCG TABS (LEVOTHYROXINE SODIUM) 1 by mouth once daily  #30 x 11   Entered by:   Shonna Chock   Authorized by:   Marga Melnick MD   Signed by:   Shonna Chock on 11/10/2009   Method used:   Electronically to        [facility]* (retail)       [address] [place], [place]  [address]       Ph: [phone] or [phone]       Fax: [phone]   RxID:   [id number]

## 2010-06-08 NOTE — Progress Notes (Signed)
Summary: Bupropion refill  Phone Note Refill Request Message from:  Fax from Pharmacy on May 18, 2010 10:03 AM  Refills Requested: Medication #1:  BUDEPRION XL 150 MG XR24H-TAB 2 by mouth once daily   Last Refilled: 04/17/2010 [facility], [address], [place], [place]     phone [phone],   fax = [phone]    qty = 60  Next Appointment Scheduled: Tues 3/13   [place] Initial call taken by: Jerolyn Shin,  May 18, 2010 10:04 AM    Prescriptions: BUDEPRION XL 150 MG XR24H-TAB (BUPROPION HCL) 2 by mouth once daily  #60 x 2   Entered by:   Shonna Chock CMA   Authorized by:   Marga Melnick MD   Signed by:   Shonna Chock CMA on 05/18/2010   Method used:   Electronically to        [facility]* (retail)       [address] [place], [place]  [address]       Ph: [phone] or [phone]       Fax: [phone]   RxID:   [id number]

## 2010-06-14 NOTE — Progress Notes (Signed)
Summary: Refill Request  Phone Note Refill Request Call back at [phone] Message from:  Pharmacy on June 05, 2010 9:22 AM  Refills Requested: Medication #1:  AMLODIPINE BESY-BENAZEPRIL HCL 5-20 MG CAPS 1 once daily   Dosage confirmed as above?Dosage Confirmed   Supply Requested: 30   Last Refilled: 05/02/2010 [facility]  Next Appointment Scheduled: 3.13.12 Initial call taken by: Harold Barban,  June 05, 2010 9:23 AM    Prescriptions: AMLODIPINE BESY-BENAZEPRIL HCL 5-20 MG CAPS (AMLODIPINE BESY-BENAZEPRIL HCL) 1 once daily  #30 x 0   Entered by:   Shonna Chock CMA   Authorized by:   Marga Melnick MD   Signed by:   Shonna Chock CMA on 06/05/2010   Method used:   Electronically to        [facility]* (retail)       [address] [place], [place]  [address]       Ph: [phone] or [phone]       Fax: [phone]   RxID:   [id number]

## 2010-07-10 ENCOUNTER — Telehealth (INDEPENDENT_AMBULATORY_CARE_PROVIDER_SITE_OTHER): Payer: Self-pay | Admitting: *Deleted

## 2010-07-18 ENCOUNTER — Encounter (INDEPENDENT_AMBULATORY_CARE_PROVIDER_SITE_OTHER): Payer: BC Managed Care – PPO | Admitting: Internal Medicine

## 2010-07-18 ENCOUNTER — Encounter: Payer: Self-pay | Admitting: Internal Medicine

## 2010-07-18 ENCOUNTER — Encounter (INDEPENDENT_AMBULATORY_CARE_PROVIDER_SITE_OTHER): Payer: Self-pay | Admitting: *Deleted

## 2010-07-18 ENCOUNTER — Other Ambulatory Visit: Payer: Self-pay | Admitting: Internal Medicine

## 2010-07-18 DIAGNOSIS — Z1211 Encounter for screening for malignant neoplasm of colon: Secondary | ICD-10-CM

## 2010-07-18 DIAGNOSIS — E785 Hyperlipidemia, unspecified: Secondary | ICD-10-CM

## 2010-07-18 DIAGNOSIS — E8881 Metabolic syndrome: Secondary | ICD-10-CM

## 2010-07-18 DIAGNOSIS — E039 Hypothyroidism, unspecified: Secondary | ICD-10-CM

## 2010-07-18 DIAGNOSIS — Z0000 Encounter for general adult medical examination without abnormal findings: Secondary | ICD-10-CM

## 2010-07-18 DIAGNOSIS — I10 Essential (primary) hypertension: Secondary | ICD-10-CM

## 2010-07-18 DIAGNOSIS — K219 Gastro-esophageal reflux disease without esophagitis: Secondary | ICD-10-CM

## 2010-07-18 DIAGNOSIS — Z8249 Family history of ischemic heart disease and other diseases of the circulatory system: Secondary | ICD-10-CM

## 2010-07-18 LAB — LIPID PANEL
Cholesterol: 172 mg/dL (ref 0–200)
HDL: 40.2 mg/dL (ref >39.00)
Triglycerides: 178 mg/dL — ABNORMAL HIGH (ref 0.0–149.0)
VLDL: 35.6 mg/dL (ref 0.0–40.0)

## 2010-07-18 LAB — BASIC METABOLIC PANEL
CO2: 29 mEq/L (ref 19–32)
Calcium: 9.3 mg/dL (ref 8.4–10.5)
Chloride: 101 mEq/L (ref 96–112)
Creatinine, Ser: 1.3 mg/dL (ref 0.4–1.5)
Sodium: 138 mEq/L (ref 135–145)

## 2010-07-18 LAB — CBC WITH DIFFERENTIAL/PLATELET
Basophils Relative: 0.4 % (ref 0.0–3.0)
Eosinophils Relative: 0.9 % (ref 0.0–5.0)
Hemoglobin: 15.7 g/dL (ref 13.0–17.0)
Lymphocytes Relative: 38.2 % (ref 12.0–46.0)
Neutro Abs: 4.3 10*3/uL (ref 1.4–7.7)
Neutrophils Relative %: 52.6 % (ref 43.0–77.0)
RBC: 5.16 Mil/uL (ref 4.22–5.81)
WBC: 8.2 10*3/uL (ref 4.5–10.5)

## 2010-07-18 LAB — CONVERTED CEMR LAB
Ketones, urine, test strip: NEGATIVE
Nitrite: NEGATIVE
Protein, U semiquant: NEGATIVE
Specific Gravity, Urine: 1.01
Urobilinogen, UA: 0.2

## 2010-07-18 LAB — HEPATIC FUNCTION PANEL
ALT: 31 U/L (ref 0–53)
AST: 22 U/L (ref 0–37)
Albumin: 4.2 g/dL (ref 3.5–5.2)
Alkaline Phosphatase: 73 U/L (ref 39–117)
Bilirubin, Direct: 0.2 mg/dL (ref 0.0–0.3)
Total Protein: 7.7 g/dL (ref 6.0–8.3)

## 2010-07-18 NOTE — Progress Notes (Signed)
Summary: refill  Phone Note Refill Request Message from:  Fax from Pharmacy on July 10, 2010 2:19 PM  Refills Requested: Medication #1:  AMLODIPINE BESY-BENAZEPRIL HCL 5-20 MG CAPS 1 once daily [facility] - fax [phone]  Initial call taken by: Okey Regal Spring,  July 10, 2010 2:20 PM    Prescriptions: AMLODIPINE BESY-BENAZEPRIL HCL 5-20 MG CAPS (AMLODIPINE BESY-BENAZEPRIL HCL) 1 once daily  #30 x 0   Entered by:   Shonna Chock CMA   Authorized by:   Marga Melnick MD   Signed by:   Shonna Chock CMA on 07/10/2010   Method used:   Electronically to        [facility]* (retail)       [address] [place], [place]  [address]       Ph: [phone] or [phone]       Fax: [phone]   RxID:   [id number]

## 2010-07-25 NOTE — Assessment & Plan Note (Signed)
Summary: CPX/FASTINH//TSH/LCH/PH   Vital Signs:  Patient profile:   50 year old male Height:      71.5 inches Weight:      351.2 pounds BMI:     48.47 Temp:     98.3 degrees F oral Pulse rate:   80 / minute Resp:     14 per minute BP sitting:   138 / 92  (left arm) Cuff size:   large  Vitals Entered By: Shonna Chock CMA (July 18, 2010 9:46 AM)   Vision Screening:Left eye w/o correction: 20 / 30 Right Eye w/o correction: 20 / 25 Both eyes w/o correction:  20/ 25  Color vision testing: normal     Vision Comments: Patient has contacts-not restricted to wear   Vision Entered By: Shonna Chock CMA (July 18, 2010 9:47 AM)   History of Present Illness:    Mr. Colquhoun is here for a physical; he is asymptomatic except for work  related stress.    Hypertension Follow-Up: He  reports urinary frequency, but denies lightheadedness, headaches, edema, and fatigue.  The patient denies the following associated symptoms: chest pain, chest pressure, exercise intolerance, dyspnea, palpitations, and syncope.  Compliance with medications (by patient report) has been near 100%.  The patient reports that dietary compliance has been fair.  The patient reports no exercise.  Adjunctive measures currently used by the patient include salt restriction.  BP @ home averages 138/85-87.    Hyperlipidemia Follow-Up: He  denies muscle aches, GI upset, abdominal pain, constipation, and diarrhea.  Compliance with medications (by patient report) has been near 100%.      GERD:  He  reports weight gain of 15# , but  he denies acid reflux, sour taste in mouth, epigastric pain,  trouble swallowing, melena, dysphagia, and hematemesis if he takes the PPI.  Symptoms are worse with spicy foods.    Lipid Management History:      Positive NCEP/ATP III risk factors include male age 79 years old or older, family history for ischemic heart disease (males less than 63 years old), and hypertension.  Negative NCEP/ATP III risk factors  include non-diabetic, non-tobacco-user status, no ASHD (atherosclerotic heart disease), no prior stroke/TIA, no peripheral vascular disease, and no history of aortic aneurysm.     Current Medications (verified): 1)  Levothroid 175 Mcg Tabs (Levothyroxine Sodium) .Marland Kitchen.. 1 By Mouth Once Daily 2)  Multivitamins  Tabs (Multiple Vitamin) .Marland Kitchen.. 1 By Mouth Once Daily 3)  Calcium 600 Mg Tabs (Calcium) .Marland Kitchen.. 1 By Mouth Once Daily 4)  Iron 325 (65 Fe) Mg Tabs (Ferrous Sulfate) .Marland Kitchen.. 1 By Mouth Once Daily 5)  B-12 Dots 500 Mcg Subl (Cyanocobalamin) .Marland Kitchen.. 1 By Mouth Once Daily 6)  Chromium Picolinate 500 Mcg Tabs (Chromium Picolinate) .... Patient Take Once Daily 7)  Glucosamine 2000mg c (Liquid .... Once Daily 8)  Chrondroiton (Liquid) .... Once Daily 9)  Msm 500 (Liquid) .... Once Daily 10)  Flonase 50 Mcg/act Susp (Fluticasone Propionate) .... At Bedtime 11)  Budeprion Xl 150 Mg Xr24h-Tab (Bupropion Hcl) .... 2 By Mouth Once Daily 12)  Amlodipine Besy-Benazepril Hcl 5-20 Mg Caps (Amlodipine Besy-Benazepril Hcl) .Marland Kitchen.. 1 Once Daily 13)  Pantoprazole Sodium 40 Mg Tbec (Pantoprazole Sodium) .Marland Kitchen.. 1 Once Daily As Needed 14)  Pravastatin Sodium 20 Mg Tabs (Pravastatin Sodium) .Marland Kitchen.. 1 At Bedtime, Appointment Due  Allergies: 1)  ! Sulfa  Past History:  Past Medical History: Sleep apnea,CPAP  D/Ced 2005 Hypertension Hyperlipidemia/ Metabolic Syndrome: LDL 91(1369/752), TG 108,  HDL 54; LDL goal = < 85, ideal =< 70. Framingham Study LDL goal = < 130. Hypothyroidism GERD  Past Surgical History: Tonsillar  cancer , Surgery,Chemotherapy, Radiation,  Dr Myna Hidalgo, 2005  PMH arthroscopy X3  Rotator cuff repair bilaterally  ; Gastric bypass 2004 Total knee replacement R  Family History: MGF: DM Father: +/- DM, carrier for Hemachromatosis Mother: CAD,DDD,pancreatic disease, breast cancer Siblings: neg; Maternal FH CAD; P uncle:  cns cancer ; P uncle:  prostate  cancer ; MI :3 M uncles (2 prior to  41) & MGF   Social History: Occupation: Engineer, maintenance [employer] Alcohol use-yes,socially: 2-3X/week Regular exercise-no Former Smoker, quit 2004  Review of Systems  The patient denies anorexia, fever, vision loss, decreased hearing, prolonged cough, hemoptysis, hematuria, suspicious skin lesions, unusual weight change, abnormal bleeding, enlarged lymph nodes, and angioedema.   Psych:  Complains of anxiety, easily angered, and irritability; denies depression and easily tearful; Wellbutrin not effective now.  Physical Exam  General:  in no acute distress; alert,appropriate and cooperative throughout examination; weight excess Head:  Normocephalic and atraumatic without obvious abnormalities. pattern  alopecia ; moustache Eyes:  No corneal or conjunctival inflammation noted. EOMI. Perrla. Funduscopic exam benign, without hemorrhages, exudates or papilledema. Field of  Vision grossly normal. Ears:  External ear exam shows no significant lesions or deformities.  Otoscopic examination reveals clear canals, tympanic membranes are intact bilaterally without bulging, retraction, inflammation or discharge. Hearing is grossly normal bilaterally. Nose:  External nasal examination shows no deformity or inflammation. Nasal mucosa are pink and moist without lesions or exudates. Mouth:  Oral mucosa and oropharynx without lesions or exudates.  Teeth in good repair. Neck:  No deformities, masses, or tenderness noted. Lungs:  Normal respiratory effort, chest expands symmetrically. Lungs are clear to auscultation, no crackles or wheezes. Heart:  Normal rate and regular rhythm. S1 and S2 normal without  murmur, click, rub. S4 gallop Abdomen:  Bowel sounds positive,abdomen soft and non-tender without masses, organomegaly or hernias noted.Protuberant Rectal:  No external abnormalities noted. Normal sphincter tone. No rectal masses or tenderness. Genitalia:  Testes bilaterally descended without nodularity, tenderness  or masses. No scrotal masses or lesions. No penis lesions or urethral discharge. Prostate:  Prostate gland firm and smooth, no enlargement, nodularity, tenderness, mass, asymmetry or induration. Msk:  No deformity or scoliosis noted of thoracic or lumbar spine.   Pulses:  R and L carotid,radial,dorsalis pedis and posterior tibial pulses are full and equal bilaterally Extremities:  No clubbing, cyanosis, edema. Flexion 4th R finger Neurologic:  alert & oriented X3 and DTRs symmetrical and 1/2+ Skin:  Intact without suspicious lesions or rashes Cervical Nodes:  No lymphadenopathy noted Axillary Nodes:  No palpable lymphadenopathy Inguinal Nodes:  No significant adenopathy Psych:  memory intact for recent and remote, normally interactive, and good eye contact.     Impression & Recommendations:  Problem # 1:  ROUTINE GENERAL MEDICAL EXAM@HEALTH  CARE FACL (ICD-V70.0)  Orders: Venipuncture (78295) TLB-Lipid Panel (80061-LIPID) TLB-BMP (Basic Metabolic Panel-BMET) (80048-METABOL) TLB-CBC Platelet - w/Differential (85025-CBCD) TLB-Hepatic/Liver Function Pnl (80076-HEPATIC) TLB-TSH (Thyroid Stimulating Hormone) (84443-TSH) TLB-A1C / Hgb A1C (Glycohemoglobin) (83036-A1C) TLB-PSA (Prostate Specific Antigen) (84153-PSA) Specimen Handling (62130) Gastroenterology Referral (GI)  Problem # 2:  HYPERTENSION (ICD-401.9)  His updated medication list for this problem includes:    Amlodipine Besy-benazepril Hcl 5-20 Mg Caps (Amlodipine besy-benazepril hcl) .Marland Kitchen... 1 once daily  Problem # 3:  HYPERLIPIDEMIA (ICD-272.4)  His updated medication list for this problem includes:    Pravastatin  Sodium 20 Mg Tabs (Pravastatin sodium) .Marland Kitchen... 1 at bedtime, appointment due  Problem # 4:  HYPOTHYROIDISM (ICD-244.9)  His updated medication list for this problem includes:    Levothroid 175 Mcg Tabs (Levothyroxine sodium) .Marland Kitchen... 1 by mouth once daily  Problem # 5:  METABOLIC SYNDROME X (ICD-277.7)  Problem #  6:  SCREENING, COLON CANCER (ICD-V76.51)  Complete Medication List: 1)  Levothroid 175 Mcg Tabs (Levothyroxine sodium) .Marland Kitchen.. 1 by mouth once daily 2)  Multivitamins Tabs (Multiple vitamin) .Marland Kitchen.. 1 by mouth once daily 3)  Calcium 600 Mg Tabs (Calcium) .Marland Kitchen.. 1 by mouth once daily 4)  Iron 325 (65 Fe) Mg Tabs (Ferrous sulfate) .Marland Kitchen.. 1 by mouth once daily 5)  B-12 Dots 500 Mcg Subl (Cyanocobalamin) .Marland Kitchen.. 1 by mouth once daily 6)  Chromium Picolinate 500 Mcg Tabs (Chromium picolinate) .... Patient take once daily 7)  Glucosamine 2000mg c (liquid  .... Once daily 8)  Chrondroiton (liquid)  .... Once daily 9)  Msm 500 (liquid)  .... Once daily 10)  Flonase 50 Mcg/act Susp (Fluticasone propionate) .... At bedtime 11)  Budeprion Xl 150 Mg Xr24h-tab (Bupropion hcl) .... 2 by mouth once daily 12)  Amlodipine Besy-benazepril Hcl 5-20 Mg Caps (Amlodipine besy-benazepril hcl) .Marland Kitchen.. 1 once daily 13)  Pantoprazole Sodium 40 Mg Tbec (Pantoprazole sodium) .Marland Kitchen.. 1 once daily as needed 14)  Pravastatin Sodium 20 Mg Tabs (Pravastatin sodium) .Marland Kitchen.. 1 at bedtime, appointment due 15)  Lorazepam 0.5 Mg Tabs (Lorazepam) .Marland Kitchen.. 1 every 8-12 hrs as needed for stress  Other Orders: UA Dipstick w/o Micro (manual) (54098) EKG w/ Interpretation (93000)  Lipid Assessment/Plan:      Based on NCEP/ATP III, the patient's risk factor category is "0-1 risk factors".  The patient's lipid goals are as follows: Total cholesterol goal is 200; LDL cholesterol goal is 85; HDL cholesterol goal is 40; Triglyceride goal is 150.  His LDL cholesterol goal has not been met.  Secondary causes for hyperlipidemia have been ruled out.  He has been counseled on adjunctive measures for lowering his cholesterol and has been provided with dietary instructions.    Patient Instructions: 1)  Check your Blood Pressure regularly.  your goal =: 135/85 ON AVERAGE  . 2)  It is important that you exercise regularly at least 20 minutes 5 times a  week. If you develop chest pain, have severe difficulty breathing, or feel very tired , stop exercising immediately and seek medical attention. Prescriptions: LORAZEPAM 0.5 MG TABS (LORAZEPAM) 1 every 8-12 hrs as needed for stress  #30 x 3   Entered and Authorized by:   Marga Melnick MD   Signed by:   Marga Melnick MD on 07/18/2010   Method used:   Print then Give to Patient   RxID:   [id number]    Orders Added: 1)  UA Dipstick w/o Micro (manual) [81002] 2)  EKG w/ Interpretation [93000] 3)  Est. Patient 40-64 years [99396] 4)  Venipuncture [36415] 5)  TLB-Lipid Panel [80061-LIPID] 6)  TLB-BMP (Basic Metabolic Panel-BMET) [80048-METABOL] 7)  TLB-CBC Platelet - w/Differential [85025-CBCD] 8)  TLB-Hepatic/Liver Function Pnl [80076-HEPATIC] 9)  TLB-TSH (Thyroid Stimulating Hormone) [84443-TSH] 10)  TLB-A1C / Hgb A1C (Glycohemoglobin) [83036-A1C] 11)  TLB-PSA (Prostate Specific Antigen) [65784-ONG] 12)  Specimen Handling [99000] 13)  Gastroenterology Referral [GI]     Laboratory Results   Urine Tests    Routine Urinalysis   Color: lt. yellow Appearance: Clear Glucose: negative   (Normal Range: Negative) Bilirubin: negative   (Normal  Range: Negative) Ketone: negative   (Normal Range: Negative) Spec. Gravity: 1.010   (Normal Range: 1.003-1.035) Blood: trace-intact   (Normal Range: Negative) pH: 7.0   (Normal Range: 5.0-8.0) Protein: negative   (Normal Range: Negative) Urobilinogen: 0.2   (Normal Range: 0-1) Nitrite: negative   (Normal Range: Negative) Leukocyte Esterace: negative   (Normal Range: Negative)

## 2010-07-25 NOTE — Letter (Signed)
Summary: Pre Visit Letter Revised  [facility]  [address] [place], [place] [address]   Phone: [phone]  Fax: [phone]        07/18/2010 MRN: [id number] [facility] [address] [place], [place]  [address]  [place]             Procedure Date:  08-10-10           Direct Colon--Dr.  Arlyce Dice   Welcome to the Gastroenterology Division at [facility].    You are scheduled to see a nurse for your pre-procedure visit on 07-27-10 at 4:00p.m. on the 3rd floor at [facility], [address]  We ask that you try to arrive at our office 15 minutes prior to your appointment time to allow for check-in.  Please take a minute to review the attached form.  If you answer "Yes" to one or more of the questions on the first page, we ask that you call the person listed at your earliest opportunity.  If you answer "No" to all of the questions, please complete the rest of the form and bring it to your appointment.    Your nurse visit will consist of discussing your medical and surgical history, your immediate family medical history, and your medications.   If you are unable to list all of your medications on the form, please bring the medication bottles to your appointment and we will list them.  We will need to be aware of both prescribed and over the counter drugs.  We will need to know exact dosage information as well.    Please be prepared to read and sign documents such as consent forms, a financial agreement, and acknowledgement forms.  If necessary, and with your consent, a friend or relative is welcome to sit-in on the nurse visit with you.  Please bring your insurance card so that we may make a copy of it.  If your insurance requires a referral to see a specialist, please bring your referral form from your primary care physician.  No co-pay is required for this nurse visit.     If you cannot keep your appointment, please call [phone] to cancel or reschedule  prior to your appointment date.  This allows [place] the opportunity to schedule an appointment for another patient in need of care.    Thank you for choosing [facility] for your medical needs.  We appreciate the opportunity to care for you.  Please visit [place] at our website  to learn more about our practice.  Sincerely, The Gastroenterology Division

## 2010-07-27 ENCOUNTER — Ambulatory Visit (AMBULATORY_SURGERY_CENTER): Payer: BC Managed Care – PPO | Admitting: *Deleted

## 2010-07-27 VITALS — Ht 72.0 in | Wt 360.0 lb

## 2010-07-27 DIAGNOSIS — Z1211 Encounter for screening for malignant neoplasm of colon: Secondary | ICD-10-CM

## 2010-07-27 MED ORDER — PEG-KCL-NACL-NASULF-NA ASC-C 100 G PO SOLR: 1.0000 | Freq: Once | ORAL | Status: AC

## 2010-08-09 ENCOUNTER — Other Ambulatory Visit: Payer: BC Managed Care – PPO | Admitting: Gastroenterology

## 2010-08-09 ENCOUNTER — Ambulatory Visit (HOSPITAL_COMMUNITY)
Admission: RE | Admit: 2010-08-09 | Discharge: 2010-08-09 | Disposition: A | Discharge status: Home / Self Care | Payer: BC Managed Care – PPO | Source: Ambulatory Visit | Attending: Gastroenterology | Admitting: Gastroenterology

## 2010-08-09 ENCOUNTER — Other Ambulatory Visit: Payer: Self-pay | Admitting: Gastroenterology

## 2010-08-09 ENCOUNTER — Other Ambulatory Visit: Payer: Self-pay

## 2010-08-09 DIAGNOSIS — Z1211 Encounter for screening for malignant neoplasm of colon: Secondary | ICD-10-CM | POA: Insufficient documentation

## 2010-08-09 DIAGNOSIS — D126 Benign neoplasm of colon, unspecified: Secondary | ICD-10-CM | POA: Insufficient documentation

## 2010-08-09 DIAGNOSIS — Z79899 Other long term (current) drug therapy: Secondary | ICD-10-CM | POA: Insufficient documentation

## 2010-08-09 MED ORDER — AMLODIPINE BESY-BENAZEPRIL HCL 5-20 MG PO CAPS: 1.0000 | ORAL_CAPSULE | Freq: Every day | ORAL | Status: DC

## 2010-08-10 ENCOUNTER — Other Ambulatory Visit: Payer: BC Managed Care – PPO | Admitting: Gastroenterology

## 2010-08-12 ENCOUNTER — Encounter: Payer: Self-pay | Admitting: Gastroenterology

## 2010-08-24 ENCOUNTER — Ambulatory Visit (INDEPENDENT_AMBULATORY_CARE_PROVIDER_SITE_OTHER): Payer: BC Managed Care – PPO | Admitting: Internal Medicine

## 2010-08-24 ENCOUNTER — Encounter: Payer: Self-pay | Admitting: Internal Medicine

## 2010-08-24 VITALS — BP 118/80 | HR 96 | Temp 97.7°F | Wt 345.8 lb

## 2010-08-24 DIAGNOSIS — R319 Hematuria, unspecified: Secondary | ICD-10-CM

## 2010-08-24 DIAGNOSIS — R300 Dysuria: Secondary | ICD-10-CM

## 2010-08-24 LAB — POCT URINALYSIS DIPSTICK

## 2010-08-24 MED ORDER — CIPROFLOXACIN HCL 500 MG PO TABS: 500.0000 mg | ORAL_TABLET | Freq: Two times a day (BID) | ORAL | Status: AC

## 2010-08-24 MED ORDER — PHENAZOPYRIDINE HCL 200 MG PO TABS: 200.0000 mg | ORAL_TABLET | Freq: Three times a day (TID) | ORAL | Status: DC | PRN

## 2010-08-24 NOTE — Patient Instructions (Signed)
Please drink as much nondairy fluids and  You  can. Spicy foods or alcohol may aggravate this condition. Use Pyridium  3 times a day as needed for burning.

## 2010-08-24 NOTE — Progress Notes (Signed)
  Subjective:    Patient ID: Patrick Butler, male    DOB: Sep 18, 1960, 50 y.o.   MRN: [id number]  HPI DYSURIA  Onset: 04/17   Worsening: better with Azo    Symptoms Urgency: yes  Frequency: yes, > 10X  Hesitancy: no  Hematuria: no  Flank Pain: no  Fever: yes   Highest Temp: not checked  Nausea/Vomiting: no  Discharge: no  Rash: no  Red Flags  : (Risk Factors for Complicated UTI) Recent Antibiotic Usage (last 30 days): yes, Amoxicillin post oral surgery   More than 3 UTI's last 12 months: no  PMH of  1. DM: no 2. Renal Disease/Calculi: no 3. Urinary Tract Abnormality: no  4. Instrumentation/noTrauma: no 5. Immunosuppression: no      Review of Systems Soreness in legs past 2 days     Objective:   Physical Exam General appearance is one of  Excess weight; in no distress. No lymphadenopathy about the head, neck, or axilla. See current vital signs Heart:  Normal rate and regular rhythm. S1 and S2 normal without  murmur, click, rub or other extra sounds.   S4 gallop.                             Lungs:Chest clear to auscultation; no wheezes, rhonchi,rales ,or rubs present.No increased work of breathing. Bowel sounds are normal. Abdomen is soft and nontender with no organomegaly, hernias  or masses. Skin:  Intact without suspicious lesions or rashes . No flank tenderness to percussion        Assessment & Plan:  #1 dysuria  #2 hematuria is suggested on the urine dip. Manual assessment is difficult the to the presence of the Azo.   Plan: #1 urine will be cultured  #2 Cipro will be initiated along with Pyridium as needed.

## 2010-08-27 LAB — URINE CULTURE

## 2010-08-28 ENCOUNTER — Other Ambulatory Visit: Payer: Self-pay | Admitting: *Deleted

## 2010-08-28 MED ORDER — BUPROPION HCL ER (XL) 150 MG PO TB24: 300.0000 mg | ORAL_TABLET | Freq: Every day | ORAL | Status: DC

## 2010-09-07 ENCOUNTER — Other Ambulatory Visit: Payer: Self-pay | Admitting: *Deleted

## 2010-09-07 MED ORDER — PANTOPRAZOLE SODIUM 40 MG PO TBEC: 40.0000 mg | DELAYED_RELEASE_TABLET | Freq: Every day | ORAL | Status: DC | PRN

## 2010-09-22 NOTE — H&P (Signed)
NAME:  Patrick Butler, Patrick Butler NO.:  [id number]   MEDICAL RECORD NO.:  [id number]           PATIENT TYPE:   LOCATION:                               FACILITY:  [facility]   PHYSICIAN:  John L. Rendall, M.D.  DATE OF BIRTH:  [birth year]   DATE OF ADMISSION:  03/22/2004  DATE OF DISCHARGE:                                HISTORY & PHYSICAL   CHIEF COMPLAINT:  Right knee pain.   HISTORY OF PRESENT ILLNESS:  The patient is a 50 year old white male with  many year history of progressively worsening right knee pain.  Currently the  patient's pain is a constant type pain with deep burning sharp discomfort  deep within both the medial and lateral joint regions.  It occasionally does  have some radiation down the leg but mostly stays deep within the knee.  It  creased with any ambulation specifically up and down stairs or hills. He  does have some grinding and popping in the knee.  He does have significant  night pain.  X-rays show severe osteoarthritis, medial and lateral joint  spacing with marginal osteophyte and cystic changes.   ALLERGIES:  SULFA.   CURRENT MEDICATIONS:  1.  Wellbutrin 300 mg p.o. q.d.  2.  Vitamins including calcium, B12, iron.   DIET:  The patient has a high protein low mechanical diet due to a history  of bypass and recent tonsillar cancer treatment with radiation and  chemotherapy.   PAST MEDICAL HISTORY:  1.  Gastric bypass in 2004.  2.  Sleep apnea with occasional use of CPAP.  3.  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  Two subsequent PET scans show no evidence of recurrence.  4.  Obesity close to 400 pound weight loss.   PAST SURGICAL HISTORY:  1.  Right knee arthroscopic procedures x3.  2.  Left shoulder arthroscopy by Dr. Priscille Kluver.  3.  Gastric bypass by Dr. Luretha Murphy.  4.  Lymph node removal in right neck by Dr. Ezzard Standing in 2005.  5.  Port-A-Cath placement in 2005.   The patient denies any  complications with any of the surgical procedures or  anesthesia mentioned above.   SOCIAL HISTORY:  The patient is a 50 year old white male with significant  recent reduction in weight of 400 pounds.  He does not currently smoke for  the last 18 months but does have a prior two pack day, 20 year smoking  history, does have occasional alcoholic beverage. 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   FAMILY PHYSICIAN:  Titus Dubin. Alwyn Ren, M.D.   GENERAL SURGEON:  Th[place]ton Park. Daphine Deutscher, MD   PULMONOLOGIST:  Marcelyn Bruins, M.D.   ONCOLOGIST:  Rose Phi. Myna Hidalgo, M.D.   FAMILY HISTORY:  Mother is alive with a history of breast cancer and cardiac  disease, father is alive currently taking diabetic medications.  One sister  deceased from  drowning, two brothers alive with no medical issues.   REVIEW OF SYMPTOMS:  Positive for a recent cold, upper nasal congestion with  no subsequent fevers or chills.  He did recently have a flu shot.  He  intermittently uses his CPAP machine for his sleep apnea, he relates that he  is more comfortable without it presently due to significant weight loss.  Otherwise all other review of systems categories were reviewed and are  negative.   PHYSICAL EXAMINATION:  VITAL SIGNS:  Height is 6 foot, weight is 190 pounds,  blood pressure is 108/68, pulse 76 and regular, respirations 12. The patient  is afebrile.  GENERAL:  This is a healthy appearing fairly physically fit in size white  male who ambulates with a slight right sided limp.  He is able to get easily  on and off the exam table without difficulty.  HEENT:  Head was normocephalic, pupils equal, round and reactive,  accommodating to light.  Extraocular movements intact.  Sclerae is not  icteric.  External ears without deformities, gross hearing is intact. Oral  buccal mucosa was  pink and moist with some slight posterior pharynx fullness  but no obvious lesions. Skin in the submandibular region was rather full and  pendulous.  NECK:  Supple, no palpable lymphadenopathy.  Thyroid region was nontender.  He had good range of motion of his cervical spine.  CHEST:  Lung sounds were clear and equal bilaterally.  No wheezes, rales or  rhonchi.  HEART:  Regular rate and rhythm, no murmurs, rubs or gallops.  ABDOMEN:  Soft, nontender, bowel sounds were normal active throughout.  He  had no CVA tenderness.  He had typical abdominal bypass incision scars well  healed.  EXTREMITIES:  Upper extremities were symmetric in size and shape. He had  full range of motion of his shoulders, elbows and wrists.  Motor strength  was 5/5.  Lower extremities right and left hip had full extension and  flexion with 20 degrees internal and external rotation without any  mechanical symptoms of discomfort. Right knee was tender along the medial  and lateral joint. He had no effusion, no erythema.  He had moderate  crepitus on the patella.  He had 5 degrees short of full extension, he was  able to flex it back to 110 degrees. He had no instability. The calf was  nontender.  The right knee was tender along the medial and lateral joint.  He had no effusion, no erythema. He had fine crepitus on the patella with  full extension and flexion back to 115 degrees with no instability. The calf  was nontender, ankles were symmetrical with dorsoplantar flexion.  Peripheral vascular carotid pulses were 2 plus, raised radial pulse was 2  plus, dorsalis pedis pulses were 1 plus.  He had no lower extremity edema.  NEUROLOGIC:  The patient was conscious, alert and appropriate, easily  conversing with examiner.  Cranial nerves II-XII were grossly intact, no  gross neurologic defects noted.  BREASTS/RECTAL/GU:  Deferred at this time.   IMPRESSION: 1.  End-stage osteoarthritis right knee.  2.  History of  obesity with recent gastric bypass, loss of 400 pounds.  3.  Sleep apnea improved with gastric bypass.  4.  Recent diagnosis of squamous cell carcinoma over the right tonsillar      region with recent treatment of chemo and radiation therapy. Two      subsequent PET scans negative for any recurrence of disease.   PLAN:  The patient will undergo all routine labs and tests prior to having a  right total knee arthroplasty by Dr. Priscille Kluver at [facility] on  November 16.     Belia Heman   RWK/MEDQ  D:  03/14/2004  T:  03/14/2004  Job:  [id number]

## 2010-09-22 NOTE — Op Note (Signed)
NAME:  Patrick Butler, Patrick Butler NO.:  [id number]   MEDICAL RECORD NO.:  [id number]          PATIENT TYPE:  INP   LOCATION:  0483                         FACILITY:  [facility]   PHYSICIAN:  John L. Rendall III, M.D.DATE OF BIRTH:  03-26-1961   DATE OF PROCEDURE:  03/22/2004  DATE OF DISCHARGE:                                 OPERATIVE REPORT   PREOPERATIVE DIAGNOSIS:  End-stage osteoarthritis, right knee.   SURGICAL PROCEDURE:  Computer-navigation-assisted right total knee, LCS.   POSTOPERATIVE DIAGNOSIS:  End-stage osteoarthritis, right knee.   SURGEON:  John L. Rendall, M.D.   ASSISTANT:  Legrand Pitts. Duffy, P.A.-C.   ANESTHESIA:  General with femoral nerve block.   PATHOLOGY:  Patient has a totally worn-out knee.  He used to weigh 350  pounds and has bone-against-bone in three compartments.  He has lost 120  pounds but as a result has extensive spur formation around all articular  surfaces with eburnated bone.   PROCEDURE:  Under general anesthesia, the right leg is prepared with  Duraprep and draped through the sterile field.  A sterile tourniquet is  used.  The leg is wrapped out with the esmarch, and the tourniquet is used  to 350 mm.  A midline incision is made.  The femur is grossly sized at a  large.  Shantz pins are then inserted in the femur and tibia.  Computer  navigating mapping is done, first finding the femoral head, then medial and  lateral malleoli, then shape of the proximal tibia, then shape of the distal  femur.  Once this is completed and accuracy checked, the proximal tibial  resection is done, using the tibial resection guide.  This is cut within  less than 1 degree of accuracy.  Once this is completed, the tensor is  inserted, and the flexion and extension gaps are balanced.  Once this is  completed, the first femoral guide is used for an anterior and posterior  flare of the distal femur cut, then the distal femoral cut, and the  recessing guide.   Care is taken to remove all meniscal and cruciate ligament  debris and spurs off the back of the femur.  Once this is completed, the  proximal tibia is exposed.  It is sized to a #4, and the peg hole with keel  is inserted.  Trial reduction of a 10 bearing and large femur revealed  excellent fit on the femur and tibia, but the bearing is a little short.  A  12.5 bearing gave better stability and excellent range of motion.  The  patella is osteotomized. A three peg-hole patella is then used.  Computer  navigation is then checked with all components in place.  Due to a loosening  of the tibial array mount, the final navigation alignment is not believable.  Consequently, by eyeball, the alignment is excellent, and permanent  components are cemented in place after pulse irrigation.  The tourniquet is  let down at an hour and 35 minutes.  The knee is then cauterized.  Excess  cement removed.  Closed in layers  to #1 Tycron and 2-0 Vicryl and skin  clips.  Patient tolerated the procedure well and returned to the recovery  room in good condition.    Daisy Blossom  D:  03/22/2004  T:  03/22/2004  Job:  [id number]

## 2010-09-22 NOTE — Discharge Summary (Signed)
NAME:  Patrick Butler, Patrick Butler NO.:  [id number]   MEDICAL RECORD NO.:  [id number]          PATIENT TYPE:  INP   LOCATION:  0483                         FACILITY:  [facility]   PHYSICIAN:  John L. Rendall, M.D.  DATE OF BIRTH:  1960-11-04   DATE OF ADMISSION:  03/22/2004  DATE OF DISCHARGE:  03/25/2004                                 DISCHARGE SUMMARY   ADMITTING DIAGNOSES:  1.  End-stage osteoarthritis right knee.  2.  History of obesity with recent gastric bypass, loss of about 400 pounds.  3.  Sleep apnea, improved after gastric bypass.  4.  Recent diagnosis of squamous cell carcinoma of the right tonsillar      region with recent treatment of chemotherapy and radiation therapy.   DISCHARGE DIAGNOSES:  1.  Status post right total knee arthroplasty.  2.  Acute blood loss anemia secondary to surgery requiring 2 units of packed      red blood cells.  3.  Osteoarthritis of the left knee.  4.  History of obesity, with recent gastric bypass.  5.  Sleep apnea, improved with gastric bypass.  6.  Recent diagnosis of squamous cell carcinoma of the right tonsillar      region.   HISTORY OF PRESENT ILLNESS:  Patrick Butler is a 50 year old white male with a  several-year history of progressively worsening right knee pain.  The right  knee pain is described as constant-type pain with deep, burning, sharp  discomfort within the knee at both the medial and lateral joint lines.  The  pain occasionally radiates down the leg, but mostly stays deep within the  knee.  The pain increases with ambulation, specifically up and down stairs  or hills. The patient does note some grinding and popping in the knee.  Had  significant night pain.  X-rays of the right knee show osteoarthritis, which  is severe, with medial and lateral joint space narrowing, and marginal  osteophytes and cystic changes.   ALLERGIES:  SULFA.   MEDICATIONS:  1.  Wellbutrin 300 mg p.o. daily.  2.  Vitamins, including  calcium and vitamin B12 and iron.   SURGICAL PROCEDURE:  The patient was taken to the operating room on March 22, 2004, by Dr. Jonny Ruiz L. Rendall, assisted by Legrand Pitts. Duffy, P.A.  The  patient was placed under general anesthesia and received a femoral nerve  block.  The following components were used:  a large femoral component size  4 tibial tray, 12.5-mm bearing, and a three-pegged patella.  The patient  tolerated the procedure well and returned to recovery in good and stable  condition.   CONSULTATIONS:  The following consultations were obtained while the patient  was hospitalized:  PT, OT, case management.   HOSPITAL COURSE:  Postoperative day #1, the patient had developed acute  blood loss anemia secondary to surgery, however, was asymptomatic.  H&H was  9.6 and 28.6.  The patient did have reported hypotension; however, blood  pressures were rechecked.  The systolic pressure was 104/58.  This was to be  monitored.  Otherwise, the  patient was doing well.  The pain was well  controlled.  The patient was already, at this point, ambulating in the  hallway.   On postoperative day #2, the patient's H&H dropped to 8.5 and 25.3.  The  patient remained asymptomatic.  Pain control was poor; therefore, the  patient's OxyContin was increased to 20 mg q.12 h.   On postoperative day #3, the patient's H&H dropped to 7.4 and 22.0.  The  patient was tachycardic and hypotensive; therefore, the patient was  transfused 2 units of packed red blood cells.  The patient's pain control  still remained an issue; therefore, OxyIR 5 mg 1-3 tablets every 4-6 hours  as needed for pain was added.  The patient was discharged late the evening  of March 25, 2004, in good, stable condition.  The patient's vital signs  were all stable.  The patient had progressed very well with physical  therapy.   LABORATORY DATA:  Routine labs on admission:  CBC:  White blood count was  4,000, hemoglobin 12.6, hematocrit  37.2, platelets 150,000.  At the time of  discharge, white blood cell count was 7,100, hemoglobin 7.4, hematocrit  22.0, platelets 95,000.  The patient was to be transfused with 2 units of  packed red blood cells again before discharge.  Coags:  Upon admission, all  within normal limits.  Routine chemistries on admission:  All values were  within normal limits, except glucose, which was 104.  Routine hepatic  enzymes on admission all were within normal limits.  HIV is nonreactive on  admission,  Urinalysis on admission was negative.   DISCHARGE INSTRUCTIONS:   MEDICATIONS:  The patient was to resume home medications and add the  following:  1.  OxyContin, controlled release, 10 mg 2 tablets q.12 h. for 7 days and      then 1 tablet q.12 h. until gone.  2.  OxyIR 5 mg 1-3 tablets q.4-6 h. p.r.n. for pain.  3.  Robaxin 500 mg 1 tablet q.6-8 h. p.r.n. spasm.   ACTIVITY:  The patient is weightbearing as tolerated with a walker.   WOUND CARE:  The patient is to keep the wound clean and dry.  May shower  after two days if no drainage.   SPECIAL INSTRUCTIONS:  The patient is to call Dr. Sable Feil office if any  concern of infection or has any questions.   FOLLOWUP:  The patient is to follow up with Dr. Priscille Kluver in his office 10  days' postoperative.  The patient is to call [phone] and make an  appointment.   DISCHARGE DIET:  No restrictions.   CONDITION ON DISCHARGE:  The patient was discharged to home in good and  stable condition.     Sullivan Lone   GC/MEDQ  D:  04/19/2004  T:  04/19/2004  Job:  [id number]   cc:   Jonny Ruiz L. Rendall III, M.D.  [address] [place]  [place] [address]  Fax: [phone]

## 2010-11-06 ENCOUNTER — Other Ambulatory Visit: Payer: Self-pay | Admitting: Internal Medicine

## 2010-11-24 ENCOUNTER — Other Ambulatory Visit: Payer: Self-pay | Admitting: Internal Medicine

## 2011-04-25 ENCOUNTER — Other Ambulatory Visit: Payer: Self-pay | Admitting: Sports Medicine

## 2011-04-25 DIAGNOSIS — M545 Low back pain, unspecified: Secondary | ICD-10-CM

## 2011-04-25 DIAGNOSIS — M79604 Pain in right leg: Secondary | ICD-10-CM

## 2011-05-03 ENCOUNTER — Ambulatory Visit
Admission: RE | Admit: 2011-05-03 | Discharge: 2011-05-03 | Disposition: A | Discharge status: Home / Self Care | Payer: BC Managed Care – PPO | Source: Ambulatory Visit | Attending: Sports Medicine | Admitting: Sports Medicine

## 2011-05-03 DIAGNOSIS — M545 Low back pain, unspecified: Secondary | ICD-10-CM

## 2011-05-03 DIAGNOSIS — M79604 Pain in right leg: Secondary | ICD-10-CM

## 2011-05-22 ENCOUNTER — Other Ambulatory Visit: Payer: Self-pay | Admitting: Neurosurgery

## 2011-05-23 ENCOUNTER — Encounter (HOSPITAL_COMMUNITY): Payer: Self-pay

## 2011-05-23 MED ORDER — CEFAZOLIN SODIUM-DEXTROSE 2-3 GM-% IV SOLR
2.0000 g | INTRAVENOUS | Status: AC
  Administered 2011-05-24: 2 g via INTRAVENOUS
  Filled 2011-05-23: qty 50

## 2011-05-24 ENCOUNTER — Encounter (HOSPITAL_COMMUNITY): Payer: Self-pay | Admitting: Certified Registered Nurse Anesthetist

## 2011-05-24 ENCOUNTER — Observation Stay (HOSPITAL_COMMUNITY)
Admission: RE | Admit: 2011-05-24 | Discharge: 2011-05-25 | Disposition: A | Discharge status: Home / Self Care | Payer: BC Managed Care – PPO | Source: Ambulatory Visit | Attending: Neurosurgery | Admitting: Neurosurgery

## 2011-05-24 ENCOUNTER — Encounter (HOSPITAL_COMMUNITY): Payer: Self-pay | Admitting: *Deleted

## 2011-05-24 ENCOUNTER — Ambulatory Visit (HOSPITAL_COMMUNITY): Payer: BC Managed Care – PPO

## 2011-05-24 ENCOUNTER — Ambulatory Visit (HOSPITAL_COMMUNITY): Payer: BC Managed Care – PPO | Admitting: Certified Registered Nurse Anesthetist

## 2011-05-24 ENCOUNTER — Encounter (HOSPITAL_COMMUNITY)
Admission: RE | Disposition: A | Discharge status: Home / Self Care | Payer: Self-pay | Source: Ambulatory Visit | Attending: Neurosurgery

## 2011-05-24 ENCOUNTER — Other Ambulatory Visit: Payer: Self-pay

## 2011-05-24 DIAGNOSIS — Z23 Encounter for immunization: Secondary | ICD-10-CM | POA: Insufficient documentation

## 2011-05-24 DIAGNOSIS — C099 Malignant neoplasm of tonsil, unspecified: Secondary | ICD-10-CM | POA: Insufficient documentation

## 2011-05-24 DIAGNOSIS — M5116 Intervertebral disc disorders with radiculopathy, lumbar region: Secondary | ICD-10-CM | POA: Diagnosis present

## 2011-05-24 DIAGNOSIS — R300 Dysuria: Secondary | ICD-10-CM

## 2011-05-24 DIAGNOSIS — I10 Essential (primary) hypertension: Secondary | ICD-10-CM | POA: Insufficient documentation

## 2011-05-24 DIAGNOSIS — K219 Gastro-esophageal reflux disease without esophagitis: Secondary | ICD-10-CM | POA: Insufficient documentation

## 2011-05-24 DIAGNOSIS — G4730 Sleep apnea, unspecified: Secondary | ICD-10-CM | POA: Insufficient documentation

## 2011-05-24 DIAGNOSIS — M5126 Other intervertebral disc displacement, lumbar region: Principal | ICD-10-CM | POA: Insufficient documentation

## 2011-05-24 HISTORY — DX: Unspecified osteoarthritis, unspecified site: M19.90

## 2011-05-24 HISTORY — PX: LUMBAR LAMINECTOMY/DECOMPRESSION MICRODISCECTOMY: SHX5026

## 2011-05-24 HISTORY — DX: Sleep apnea, unspecified: G47.30

## 2011-05-24 HISTORY — DX: Hypothyroidism, unspecified: E03.9

## 2011-05-24 LAB — CBC
MCHC: 32.6 g/dL (ref 30.0–36.0)
Platelets: 152 10*3/uL (ref 150–400)
RDW: 15 % (ref 11.5–15.5)
WBC: 8.5 10*3/uL (ref 4.0–10.5)

## 2011-05-24 LAB — BASIC METABOLIC PANEL
BUN: 24 mg/dL — ABNORMAL HIGH (ref 6–23)
Chloride: 102 mEq/L (ref 96–112)
GFR calc Af Amer: 82 mL/min — ABNORMAL LOW (ref >90)
GFR calc non Af Amer: 71 mL/min — ABNORMAL LOW (ref >90)
Potassium: 5.2 mEq/L — ABNORMAL HIGH (ref 3.5–5.1)
Sodium: 138 mEq/L (ref 135–145)

## 2011-05-24 LAB — SURGICAL PCR SCREEN: MRSA, PCR: NEGATIVE

## 2011-05-24 SURGERY — LUMBAR LAMINECTOMY/DECOMPRESSION MICRODISCECTOMY
Anesthesia: General | Laterality: Right | Wound class: Clean

## 2011-05-24 MED ORDER — HYDROMORPHONE HCL PF 1 MG/ML IJ SOLN
0.2500 mg | INTRAMUSCULAR | Status: DC | PRN
  Administered 2011-05-24 (×3): 0.5 mg via INTRAVENOUS

## 2011-05-24 MED ORDER — HYDROMORPHONE HCL PF 1 MG/ML IJ SOLN
INTRAMUSCULAR | Status: AC
  Filled 2011-05-24: qty 1

## 2011-05-24 MED ORDER — LACTATED RINGERS IV SOLN
INTRAVENOUS | Status: DC | PRN
  Administered 2011-05-24 (×4): via INTRAVENOUS

## 2011-05-24 MED ORDER — PHENYLEPHRINE HCL 10 MG/ML IJ SOLN
10.0000 mg | INTRAVENOUS | Status: DC | PRN
  Administered 2011-05-24: 40 ug/min via INTRAVENOUS

## 2011-05-24 MED ORDER — MIDAZOLAM HCL 5 MG/5ML IJ SOLN
INTRAMUSCULAR | Status: DC | PRN
  Administered 2011-05-24: 2 mg via INTRAVENOUS

## 2011-05-24 MED ORDER — 0.9 % SODIUM CHLORIDE (POUR BTL) OPTIME
TOPICAL | Status: DC | PRN
  Administered 2011-05-24: 1000 mL

## 2011-05-24 MED ORDER — PROPOFOL 10 MG/ML IV BOLUS
INTRAVENOUS | Status: DC | PRN
  Administered 2011-05-24: 200 mg via INTRAVENOUS

## 2011-05-24 MED ORDER — FENTANYL CITRATE 0.05 MG/ML IJ SOLN
INTRAMUSCULAR | Status: DC | PRN
  Administered 2011-05-24 (×3): 50 ug via INTRAVENOUS
  Administered 2011-05-24: 150 ug via INTRAVENOUS
  Administered 2011-05-24: 100 ug via INTRAVENOUS
  Administered 2011-05-24 (×2): 50 ug via INTRAVENOUS

## 2011-05-24 MED ORDER — EPHEDRINE SULFATE 50 MG/ML IJ SOLN
INTRAMUSCULAR | Status: DC | PRN
  Administered 2011-05-24: 15 mg via INTRAVENOUS
  Administered 2011-05-24 (×2): 10 mg via INTRAVENOUS
  Administered 2011-05-24: 15 mg via INTRAVENOUS

## 2011-05-24 MED ORDER — ROCURONIUM BROMIDE 100 MG/10ML IV SOLN
INTRAVENOUS | Status: DC | PRN
  Administered 2011-05-24: 50 mg via INTRAVENOUS

## 2011-05-24 MED ORDER — KETOROLAC TROMETHAMINE 30 MG/ML IJ SOLN
INTRAMUSCULAR | Status: AC
  Administered 2011-05-24: 30 mg via INTRAVENOUS
  Filled 2011-05-24: qty 1

## 2011-05-24 MED ORDER — KETOROLAC TROMETHAMINE 30 MG/ML IJ SOLN
30.0000 mg | Freq: Four times a day (QID) | INTRAMUSCULAR | Status: DC
  Administered 2011-05-24 – 2011-05-25 (×3): 30 mg via INTRAVENOUS
  Filled 2011-05-24 (×6): qty 1

## 2011-05-24 MED ORDER — LIDOCAINE-EPINEPHRINE 0.5-1:200000 % IJ SOLN
INTRAMUSCULAR | Status: DC | PRN
  Administered 2011-05-24: 10 mL via INTRADERMAL

## 2011-05-24 MED ORDER — PROMETHAZINE HCL 25 MG/ML IJ SOLN: 6.2500 mg | INTRAMUSCULAR | Status: DC | PRN

## 2011-05-24 MED ORDER — THROMBIN 20000 UNITS EX KIT
PACK | CUTANEOUS | Status: DC | PRN
  Administered 2011-05-24: 20:00:00 via TOPICAL

## 2011-05-24 MED ORDER — MUPIROCIN 2 % EX OINT
TOPICAL_OINTMENT | CUTANEOUS | Status: AC
  Administered 2011-05-24: 13:00:00
  Filled 2011-05-24: qty 22

## 2011-05-24 MED ORDER — DROPERIDOL 2.5 MG/ML IJ SOLN
INTRAMUSCULAR | Status: DC | PRN
  Administered 2011-05-24: 1.2 mg via INTRAVENOUS

## 2011-05-24 SURGICAL SUPPLY — 53 items
ADH SKN CLS APL DERMABOND .7 (GAUZE/BANDAGES/DRESSINGS) ×1
APL SKNCLS STERI-STRIP NONHPOA (GAUZE/BANDAGES/DRESSINGS)
BAG DECANTER FOR FLEXI CONT (MISCELLANEOUS) ×2 IMPLANT
BENZOIN TINCTURE PRP APPL 2/3 (GAUZE/BANDAGES/DRESSINGS) IMPLANT
BLADE SURG ROTATE 9660 (MISCELLANEOUS) ×1 IMPLANT
BUR MATCHSTICK NEURO 3.0 LAGG (BURR) ×2 IMPLANT
CANISTER SUCTION 2500CC (MISCELLANEOUS) ×2 IMPLANT
CLOTH BEACON ORANGE TIMEOUT ST (SAFETY) ×2 IMPLANT
CONT SPEC 4OZ CLIKSEAL STRL BL (MISCELLANEOUS) ×2 IMPLANT
DECANTER SPIKE VIAL GLASS SM (MISCELLANEOUS) ×1 IMPLANT
DERMABOND ADVANCED (GAUZE/BANDAGES/DRESSINGS) ×1
DERMABOND ADVANCED .7 DNX12 (GAUZE/BANDAGES/DRESSINGS) ×1 IMPLANT
DRAPE LAPAROTOMY 100X72X124 (DRAPES) ×2 IMPLANT
DRAPE MICROSCOPE LEICA (MISCELLANEOUS) ×2 IMPLANT
DRAPE POUCH INSTRU U-SHP 10X18 (DRAPES) ×2 IMPLANT
DRAPE SURG 17X23 STRL (DRAPES) ×2 IMPLANT
DURAPREP 26ML APPLICATOR (WOUND CARE) ×2 IMPLANT
ELECT REM PT RETURN 9FT ADLT (ELECTROSURGICAL) ×2
ELECTRODE REM PT RTRN 9FT ADLT (ELECTROSURGICAL) ×1 IMPLANT
GAUZE SPONGE 4X4 16PLY XRAY LF (GAUZE/BANDAGES/DRESSINGS) IMPLANT
GLOVE BIO SURGEON STRL SZ7 (GLOVE) ×1 IMPLANT
GLOVE ECLIPSE 6.5 STRL STRAW (GLOVE) ×2 IMPLANT
GLOVE EXAM NITRILE LRG STRL (GLOVE) IMPLANT
GLOVE EXAM NITRILE MD LF STRL (GLOVE) IMPLANT
GLOVE EXAM NITRILE XL STR (GLOVE) IMPLANT
GLOVE EXAM NITRILE XS STR PU (GLOVE) IMPLANT
GLOVE INDICATOR 7.5 STRL GRN (GLOVE) ×1 IMPLANT
GOWN BRE IMP SLV AUR LG STRL (GOWN DISPOSABLE) ×5 IMPLANT
GOWN BRE IMP SLV AUR XL STRL (GOWN DISPOSABLE) IMPLANT
GOWN STRL REIN 2XL LVL4 (GOWN DISPOSABLE) IMPLANT
KIT BASIN OR (CUSTOM PROCEDURE TRAY) ×2 IMPLANT
KIT ROOM TURNOVER OR (KITS) ×2 IMPLANT
LIDOCAINE 0.5% WITH EPI 1:200,000 UNITS IMPLANT
NDL HYPO 25X1 1.5 SAFETY (NEEDLE) ×1 IMPLANT
NDL SPNL 18GX3.5 QUINCKE PK (NEEDLE) IMPLANT
NEEDLE HYPO 25X1 1.5 SAFETY (NEEDLE) ×2 IMPLANT
NEEDLE SPNL 18GX3.5 QUINCKE PK (NEEDLE) ×4 IMPLANT
NS IRRIG 1000ML POUR BTL (IV SOLUTION) ×2 IMPLANT
PACK LAMINECTOMY NEURO (CUSTOM PROCEDURE TRAY) ×2 IMPLANT
PAD ARMBOARD 7.5X6 YLW CONV (MISCELLANEOUS) ×6 IMPLANT
RUBBERBAND STERILE (MISCELLANEOUS) ×4 IMPLANT
SPONGE GAUZE 4X4 12PLY (GAUZE/BANDAGES/DRESSINGS) IMPLANT
SPONGE LAP 4X18 X RAY DECT (DISPOSABLE) IMPLANT
SPONGE SURGIFOAM ABS GEL SZ50 (HEMOSTASIS) ×1 IMPLANT
STRIP CLOSURE SKIN 1/2X4 (GAUZE/BANDAGES/DRESSINGS) IMPLANT
SUT VIC AB 0 CT1 18XCR BRD8 (SUTURE) ×1 IMPLANT
SUT VIC AB 0 CT1 8-18 (SUTURE) ×2
SUT VIC AB 2-0 CT1 18 (SUTURE) ×2 IMPLANT
SUT VIC AB 3-0 SH 8-18 (SUTURE) ×2 IMPLANT
SYR 20ML ECCENTRIC (SYRINGE) ×2 IMPLANT
TOWEL OR 17X24 6PK STRL BLUE (TOWEL DISPOSABLE) ×2 IMPLANT
TOWEL OR 17X26 10 PK STRL BLUE (TOWEL DISPOSABLE) ×2 IMPLANT
WATER STERILE IRR 1000ML POUR (IV SOLUTION) ×2 IMPLANT

## 2011-05-24 NOTE — Anesthesia Preprocedure Evaluation (Addendum)
Anesthesia Evaluation  Patient identified by MRN, date of birth, ID band Patient awake    Reviewed: Allergy & Precautions, H&P , NPO status , Patient's Chart, lab work & pertinent test results  Airway Mallampati: II TM Distance: >3 FB Neck ROM: Full    Dental  (+) Edentulous Upper and Edentulous Lower   Pulmonary sleep apnea (hasn't used CPAP in years) , former smoker (quit 8 years) H/o tonsillar cancer-chemo and XRT clear to auscultation  Pulmonary exam normal       Cardiovascular hypertension, Pt. on medications Regular Normal    Neuro/Psych Depression Chronic back pain    GI/Hepatic Neg liver ROS, GERD-  Medicated and Controlled,S/p gastric bypass, has lost 100lbs   Endo/Other  Hypothyroidism (on treatment) Morbid obesity  Renal/GU negative Renal ROS     Musculoskeletal   Abdominal (+) obese,   Peds  Hematology   Anesthesia Other Findings   Reproductive/Obstetrics                           Anesthesia Physical Anesthesia Plan  ASA: III  Anesthesia Plan: General   Post-op Pain Management:    Induction: Intravenous  Airway Management Planned: Oral ETT  Additional Equipment:   Intra-op Plan:   Post-operative Plan: Extubation in OR  Informed Consent: I have reviewed the patients History and Physical, chart, labs and discussed the procedure including the risks, benefits and alternatives for the proposed anesthesia with the patient or authorized representative who has indicated his/her understanding and acceptance.     Plan Discussed with: CRNA and Surgeon  Anesthesia Plan Comments: (Plan routine monitors, GETA)        Anesthesia Quick Evaluation

## 2011-05-24 NOTE — Op Note (Signed)
05/24/2011  10:48 PM  PATIENT:  Patrick Butler  51 y.o. male  PRE-OPERATIVE DIAGNOSIS:  lumbar herniated disc lumbago L5/S1 right   POST-OPERATIVE DIAGNOSIS:  lumbar herniated disc, lumbago L5/s1 right  PROCEDURE:  Procedure(s): LUMBAR LAMINECTOMY/DECOMPRESSION MICRODISCECTOMY L5  SURGEON:  Surgeon(s): Carmela Hurt, MD  ASSISTANTS:none  ANESTHESIA:   general  EBL:  Total I/O In: 3000 [I.V.:3000] Out: 100 [Blood:100]  BLOOD ADMINISTERED:none  CELL SAVER GIVEN:none  COUNT:per nursing  DRAINS: none   SPECIMEN:  No Specimen  DICTATION: [name] was brought to the operating room intubated and placed under a general anesthetic. He was rolled prone onto a Jackson Table; all pressure points were properly padded. His back was prepped and draped using sterile technique. I infiltrated the lumbar region with 1/2%lidocained/1:200000 strength epinephrine prior to making my incision. I opened the incision with a number 10 blade. I took the incision down to the thoracolumbar fascia and exposed the L5 lamina. I placed a retractor after confirming my location with an intraoperative xray. I used the high speed drill to perform a semihemilaminectomy of L5. I completed this with kerrison punches. I removed the ligamentum flavum and exposed  The thecal sac. I retracted the thecal sac and dissected with a blunt hook. I was able to tease out fragments of the disc. I did not enter the disc space. I believe the disc fragment which was free emanated from the L5/S1 space. Nevertheless I was able to remove the fragment decompressing the L5 root in the process. I used microsurgical technique to dissect around the nerve root. I then irrigated, achieved hemostasis, then closed the wound in layers using vicryl sutures. I used dermabond for a sterile dressing.Mr. Can was flipped onto his bed and extubated. He was moving all extremities afterwards.  PLAN OF CARE: Admit for overnight observation  PATIENT  DISPOSITION:  PACU - hemodynamically stable.   Delay start of Pharmacological VTE agent (>24hrs) due to surgical blood loss or risk of bleeding:  yes

## 2011-05-24 NOTE — H&P (Signed)
BP 107/77  Temp(Src) 98.1 F (36.7 C) (Oral)  Resp 20  Ht 6' (1.829 m)  Wt 156.491 kg (345 lb)  BMI 46.79 kg/m2  SpO2 100% HISTORY:     Mr. Patrick Butler presents today for evaluation of pain that he has in his back and right lower extremity.  He has had this pain since December 7th when he slipped and fell while entering his garage landing on his backside.  He and his wife think that he might have been getting a little bit better but he took another fall right before Christmas when he was in a store kneeling and was unable to support his weight on his right lower extremity.  He has no symptoms on the left side.  The right lower extremity is numb especially on the lateral aspect going into his foot.  It is weak.  He is unable to fully dorsiflex the foot.  He has never had pain like this before.  He reports weakness in his right leg and foot, numbness in the right calf and foot. No bowel or bladder dysfunction.    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  PAST MEDICAL HISTORY:  Significant for hypertension, gastroesophageal reflux disease, and right tonsil cancer.   FAMILY HISTORY:    Mother is 55 years of age and in poor health.  Father is 22 and in poor health.  He says everything is wrong with him.    PAST SURGICAL HISTORY:  He has had three previous right knee surgeries.  He has had both shoulders operated on.  He has also undergone stomach surgery.  He has had surgery on his neck to remove lymph nodes.  He has had Port-a-caths placed for chemotherapy and he has had a right knee replacement.    ALLERGIES:    HE HAS AN ALLERGY TO SULFA CONTAINING MEDICATIONS.  THEY CAUSE A RASH.    SOCIAL HISTORY:    He does not smoke.  He does drink alcohol socially.  He does not use illicit drugs.  He weighs 338 pounds.    REVIEW OF SYSTEMS:   Positive for leg weakness, back  pain on the right side.  He denies constitutional, eye, ears, nose, throat, mouth, cardiovascular, respiratory, GI, GU, skin, neurological, psychiatric, endocrine, hematologic and allergic problems.    MEDICATIONS:    He is currently taking Amlodipine, Levothyroxine, Pantoprazole, Pravastatin, Flonase, Lyrica, Diclofenac and Oxycodone.    PHYSICAL EXAMINATION:  On examination, he has weakness in the dorsiflexors of his right foot.  He can heel walk though with some difficulty mainly seen on the right. He can toe walk. There is some mild difficulty on the right side also.  He can take a 14-inch step though he does struggle to some degree on the right.  2+ reflexes are the knees, 2+ at the ankles.  Intact proprioception.  Toes are downgoing to plantar stimulation.  No clonus and no Hoffmann's sign.  PERRL, full EOM's and full visual fields. Hearing is intact to voice.  Uvula elevates in the midline. Shoulder shrug is normal.  Tongue protrudes in the midline.   Normal strength, muscle tone, bulk and coordination in the upper extremities.  Gait is mildly antalgic.  DIAGNOSTIC STUDIES:   MRI is reviewed and shows a large fragment of disc on the right side just above the  L5-S1 disc space.  The radiologist seems to think this has migrated caudally. I can't saw for sure but he does have a fragment of disc there.  The conus is normal.  Cauda is otherwise normal.  3, 4, and 5 discs are mildly degenerated.  Left side shows no abnormalities.  The paraspinous soft tissues are good.    DIAGNOSIS and PLAN:   Displaced disc right L5-S1. I am calling it a 5-1 fragment but that is what it is. I would no plan on either disc space at 4-5 or 5-1.  Given his weakness, given the numbness and given the fact that can't just keep doing this, we are going to proceed with operative decompression.  I would expect him to be in the hospital 6 hours or overnight.  Risks, benefits, bleeding, infection, no relief, need for further surgery,  damage to the nerves to the bowel and bladder causing bladder and bowel dysfunction, further weakness in the lower extremity and other problems were discussed.  He understands and wishes to proceed.

## 2011-05-24 NOTE — Anesthesia Postprocedure Evaluation (Signed)
  Anesthesia Post-op Note  Patient: Patrick Butler  Procedure(s) Performed:  LUMBAR LAMINECTOMY/DECOMPRESSION MICRODISCECTOMY - RIGHT L5-S1 Laminectomy, discectomy  Patient Location: PACU  Anesthesia Type: General  Level of Consciousness: awake, alert  and oriented  Airway and Oxygen Therapy: Patient Spontanous Breathing and [name]  Post-op Pain: mild  Post-op Assessment: Post-op Vital signs reviewed, Patient's Cardiovascular Status Stable, Respiratory Function Stable, Patent Airway, No signs of Nausea or vomiting and Pain level controlled  Post-op Vital Signs: Reviewed and stable  Complications: No apparent anesthesia complications

## 2011-05-24 NOTE — Anesthesia Procedure Notes (Signed)
Procedure Name: Intubation Date/Time: 05/24/2011 7:45 PM Performed by: Alanda Amass Pre-anesthesia Checklist: Patient identified, Timeout performed, Emergency Drugs available, Suction available and Patient being monitored Patient Re-evaluated:Patient Re-evaluated prior to inductionOxygen Delivery Method: Circle System Utilized Preoxygenation: Pre-oxygenation with 100% oxygen Intubation Type: IV induction Ventilation: Mask ventilation without difficulty Laryngoscope Size: Mac and 3 Grade View: Grade I Tube type: Oral Tube size: 7.5 mm Number of attempts: 1 Airway Equipment and Method: stylet Placement Confirmation: ETT inserted through vocal cords under direct vision,  positive ETCO2 and breath sounds checked- equal and bilateral Secured at: 21 cm Tube secured with: Tape Dental Injury: Teeth and Oropharynx as per pre-operative assessment

## 2011-05-24 NOTE — Transfer of Care (Signed)
Immediate Anesthesia Transfer of Care Note  Patient: Patrick Butler  Procedure(s) Performed:  LUMBAR LAMINECTOMY/DECOMPRESSION MICRODISCECTOMY - RIGHT L5-S1 Laminectomy, discectomy  Patient Location: PACU  Anesthesia Type: General  Level of Consciousness: awake  Airway & Oxygen Therapy: Patient Spontanous Breathing and [name]  Post-op Assessment: Report given to PACU RN and Post -op Vital signs reviewed and stable  Post vital signs: Reviewed and stable Filed Vitals:   05/24/11 1500  BP: 107/77  Temp:   Resp:     Complications: No apparent anesthesia complications

## 2011-05-25 MED ORDER — AMLODIPINE BESYLATE 5 MG PO TABS
5.0000 mg | ORAL_TABLET | Freq: Every day | ORAL | Status: DC
  Administered 2011-05-25: 5 mg via ORAL
  Filled 2011-05-25: qty 1

## 2011-05-25 MED ORDER — PANTOPRAZOLE SODIUM 40 MG PO TBEC
40.0000 mg | DELAYED_RELEASE_TABLET | Freq: Every day | ORAL | Status: DC | PRN
  Administered 2011-05-25: 40 mg via ORAL
  Filled 2011-05-25: qty 1

## 2011-05-25 MED ORDER — ZOLPIDEM TARTRATE 5 MG PO TABS: 10.0000 mg | ORAL_TABLET | Freq: Every evening | ORAL | Status: DC | PRN

## 2011-05-25 MED ORDER — ASPIRIN 81 MG PO CHEW
81.0000 mg | CHEWABLE_TABLET | Freq: Every day | ORAL | Status: DC
  Administered 2011-05-25: 81 mg via ORAL
  Filled 2011-05-25: qty 1

## 2011-05-25 MED ORDER — SODIUM CHLORIDE 0.9 % IV SOLN: 250.0000 mL | INTRAVENOUS | Status: DC

## 2011-05-25 MED ORDER — HYDROCODONE-ACETAMINOPHEN 5-325 MG PO TABS: 1.0000 | ORAL_TABLET | ORAL | Status: DC | PRN

## 2011-05-25 MED ORDER — ACETAMINOPHEN 325 MG PO TABS: 650.0000 mg | ORAL_TABLET | ORAL | Status: DC | PRN

## 2011-05-25 MED ORDER — SIMVASTATIN 5 MG PO TABS
5.0000 mg | ORAL_TABLET | Freq: Every day | ORAL | Status: DC
  Filled 2011-05-25: qty 1

## 2011-05-25 MED ORDER — POTASSIUM CHLORIDE IN NACL 20-0.9 MEQ/L-% IV SOLN
INTRAVENOUS | Status: DC
  Filled 2011-05-25 (×2): qty 1000

## 2011-05-25 MED ORDER — AMLODIPINE BESY-BENAZEPRIL HCL 5-20 MG PO CAPS
1.0000 | ORAL_CAPSULE | Freq: Every day | ORAL | Status: DC
  Filled 2011-05-25: qty 1

## 2011-05-25 MED ORDER — PHENOL 1.4 % MT LIQD: 1.0000 | OROMUCOSAL | Status: DC | PRN

## 2011-05-25 MED ORDER — CYANOCOBALAMIN 500 MCG SL SUBL: 1.0000 | SUBLINGUAL_TABLET | Freq: Every day | SUBLINGUAL | Status: DC

## 2011-05-25 MED ORDER — FERROUS GLUCONATE 324 (38 FE) MG PO TABS
325.0000 mg | ORAL_TABLET | Freq: Every day | ORAL | Status: DC
  Administered 2011-05-25: 325 mg via ORAL
  Filled 2011-05-25 (×2): qty 1

## 2011-05-25 MED ORDER — BUPROPION HCL ER (XL) 150 MG PO TB24
150.0000 mg | ORAL_TABLET | Freq: Every day | ORAL | Status: DC
  Filled 2011-05-25: qty 1

## 2011-05-25 MED ORDER — ACETAMINOPHEN 650 MG RE SUPP: 650.0000 mg | RECTAL | Status: DC | PRN

## 2011-05-25 MED ORDER — LORAZEPAM 0.5 MG PO TABS: 0.5000 mg | ORAL_TABLET | Freq: Three times a day (TID) | ORAL | Status: DC | PRN

## 2011-05-25 MED ORDER — MENTHOL 3 MG MT LOZG: 1.0000 | LOZENGE | OROMUCOSAL | Status: DC | PRN

## 2011-05-25 MED ORDER — ADULT MULTIVITAMIN W/MINERALS CH
1.0000 | ORAL_TABLET | Freq: Every day | ORAL | Status: DC
  Administered 2011-05-25: 1 via ORAL
  Filled 2011-05-25: qty 1

## 2011-05-25 MED ORDER — OXYCODONE-ACETAMINOPHEN 5-325 MG PO TABS
1.0000 | ORAL_TABLET | ORAL | Status: DC | PRN
  Administered 2011-05-25: 1 via ORAL
  Administered 2011-05-25 (×2): 2 via ORAL
  Filled 2011-05-25 (×2): qty 2

## 2011-05-25 MED ORDER — CALCIUM CARBONATE 600 MG PO TABS
600.0000 mg | ORAL_TABLET | Freq: Every day | ORAL | Status: DC
  Filled 2011-05-25: qty 1

## 2011-05-25 MED ORDER — OXYCODONE-ACETAMINOPHEN 5-325 MG PO TABS: 1.0000 | ORAL_TABLET | Freq: Four times a day (QID) | ORAL | Status: AC | PRN

## 2011-05-25 MED ORDER — ONDANSETRON HCL 4 MG/2ML IJ SOLN: 4.0000 mg | INTRAMUSCULAR | Status: DC | PRN

## 2011-05-25 MED ORDER — OXYCODONE-ACETAMINOPHEN 5-325 MG PO TABS
1.0000 | ORAL_TABLET | ORAL | Status: DC | PRN
  Filled 2011-05-25: qty 2

## 2011-05-25 MED ORDER — ALUM & MAG HYDROXIDE-SIMETH 200-200-20 MG/5ML PO SUSP: 30.0000 mL | Freq: Four times a day (QID) | ORAL | Status: DC | PRN

## 2011-05-25 MED ORDER — CYANOCOBALAMIN 500 MCG PO TABS
500.0000 ug | ORAL_TABLET | Freq: Every day | ORAL | Status: DC
  Administered 2011-05-25: 500 ug via ORAL
  Filled 2011-05-25: qty 1

## 2011-05-25 MED ORDER — DIAZEPAM 5 MG PO TABS
5.0000 mg | ORAL_TABLET | Freq: Four times a day (QID) | ORAL | Status: DC | PRN
  Administered 2011-05-25 (×2): 5 mg via ORAL
  Filled 2011-05-25 (×2): qty 1

## 2011-05-25 MED ORDER — SODIUM CHLORIDE 0.9 % IJ SOLN: 3.0000 mL | Freq: Two times a day (BID) | INTRAMUSCULAR | Status: DC

## 2011-05-25 MED ORDER — BENAZEPRIL HCL 20 MG PO TABS
20.0000 mg | ORAL_TABLET | Freq: Every day | ORAL | Status: DC
  Administered 2011-05-25: 20 mg via ORAL
  Filled 2011-05-25: qty 1

## 2011-05-25 MED ORDER — PREGABALIN 50 MG PO CAPS
75.0000 mg | ORAL_CAPSULE | ORAL | Status: DC
  Administered 2011-05-25: 75 mg via ORAL
  Filled 2011-05-25: qty 1

## 2011-05-25 MED ORDER — CALCIUM CARBONATE 1250 (500 CA) MG PO TABS
1.0000 | ORAL_TABLET | Freq: Every day | ORAL | Status: DC
  Administered 2011-05-25: 500 mg via ORAL
  Filled 2011-05-25: qty 1

## 2011-05-25 MED ORDER — PHENAZOPYRIDINE HCL 200 MG PO TABS
200.0000 mg | ORAL_TABLET | Freq: Three times a day (TID) | ORAL | Status: DC | PRN
  Filled 2011-05-25: qty 1

## 2011-05-25 MED ORDER — FLUTICASONE PROPIONATE 50 MCG/ACT NA SUSP: 2.0000 | Freq: Every day | NASAL | Status: DC | PRN

## 2011-05-25 MED ORDER — SODIUM CHLORIDE 0.9 % IJ SOLN: 3.0000 mL | INTRAMUSCULAR | Status: DC | PRN

## 2011-05-25 MED ORDER — LEVOTHYROXINE SODIUM 175 MCG PO TABS
175.0000 ug | ORAL_TABLET | Freq: Every day | ORAL | Status: DC
  Administered 2011-05-25: 175 ug via ORAL
  Filled 2011-05-25 (×2): qty 1

## 2011-05-25 MED ORDER — INFLUENZA VIRUS VACC SPLIT PF IM SUSP
0.5000 mL | Freq: Once | INTRAMUSCULAR | Status: AC
  Administered 2011-05-25: 0.5 mL via INTRAMUSCULAR
  Filled 2011-05-25: qty 0.5

## 2011-05-25 MED ORDER — MORPHINE SULFATE 4 MG/ML IJ SOLN: 1.0000 mg | INTRAMUSCULAR | Status: DC | PRN

## 2011-05-25 NOTE — Discharge Summary (Signed)
  BP 139/89  Pulse 95  Temp(Src) 98.2 F (36.8 C) (Oral)  Resp 20  Ht 6' (1.829 m)  Wt 156.491 kg (345 lb)  BMI 46.79 kg/m2  SpO2 95% Patrick Butler was admitted and taken to the operating room for removal of a free disc fragment behind the body of L5, eccentric to the right side. Post op he has voided, tolerated a regular diet, and ambulated. On exam he is alert and oriented x4 Speech is clear and fluent Moving all extremities well, 5/5 Wound is clean dry and without signs of infection. Discharge to home.

## 2011-05-29 ENCOUNTER — Encounter (HOSPITAL_COMMUNITY): Payer: Self-pay | Admitting: Neurosurgery

## 2011-06-25 ENCOUNTER — Other Ambulatory Visit: Payer: Self-pay | Admitting: Internal Medicine

## 2011-06-25 NOTE — Telephone Encounter (Signed)
TSH 244.9 

## 2011-07-23 ENCOUNTER — Other Ambulatory Visit: Payer: Self-pay | Admitting: Internal Medicine

## 2011-07-23 NOTE — Telephone Encounter (Signed)
TSH 244.9 

## 2011-08-06 ENCOUNTER — Other Ambulatory Visit: Payer: Self-pay | Admitting: Internal Medicine

## 2011-08-08 ENCOUNTER — Ambulatory Visit (INDEPENDENT_AMBULATORY_CARE_PROVIDER_SITE_OTHER): Payer: BC Managed Care – PPO | Admitting: Internal Medicine

## 2011-08-08 ENCOUNTER — Encounter: Payer: Self-pay | Admitting: Internal Medicine

## 2011-08-08 VITALS — BP 132/90 | HR 100 | Temp 98.1°F | Resp 12 | Ht 71.5 in | Wt 360.0 lb

## 2011-08-08 DIAGNOSIS — Z0000 Encounter for general adult medical examination without abnormal findings: Secondary | ICD-10-CM

## 2011-08-08 DIAGNOSIS — R351 Nocturia: Secondary | ICD-10-CM

## 2011-08-08 DIAGNOSIS — M5126 Other intervertebral disc displacement, lumbar region: Secondary | ICD-10-CM

## 2011-08-08 DIAGNOSIS — M5116 Intervertebral disc disorders with radiculopathy, lumbar region: Secondary | ICD-10-CM

## 2011-08-08 DIAGNOSIS — N400 Enlarged prostate without lower urinary tract symptoms: Secondary | ICD-10-CM

## 2011-08-08 DIAGNOSIS — G4733 Obstructive sleep apnea (adult) (pediatric): Secondary | ICD-10-CM | POA: Insufficient documentation

## 2011-08-08 DIAGNOSIS — E039 Hypothyroidism, unspecified: Secondary | ICD-10-CM

## 2011-08-08 LAB — LIPID PANEL
Cholesterol: 196 mg/dL (ref 0–200)
LDL Cholesterol: 121 mg/dL — ABNORMAL HIGH (ref 0–99)

## 2011-08-08 LAB — HEPATIC FUNCTION PANEL
ALT: 24 U/L (ref 0–53)
AST: 21 U/L (ref 0–37)
Albumin: 4.1 g/dL (ref 3.5–5.2)
Total Bilirubin: 0.6 mg/dL (ref 0.3–1.2)

## 2011-08-08 LAB — PSA: PSA: 0.51 ng/mL (ref 0.10–4.00)

## 2011-08-08 NOTE — Assessment & Plan Note (Signed)
Minimal asymmetric enlargement warrants PSA despite value of 0.38 on 07/18/10

## 2011-08-08 NOTE — Assessment & Plan Note (Signed)
See minor prostate changes. His increasing nocturia may be a manifestation of sleep apnea

## 2011-08-08 NOTE — Patient Instructions (Signed)
Preventive Health Care: Exercise at least 30-45 minutes a day,  3-4 days a week.  Eat a low-fat diet with lots of fruits and vegetables, up to 7-9 servings per day. Avoid obesity; your goal is waist measurement < 40 inches.Consume less than 40 grams of sugar per day from foods & drinks with High Fructose Corn Sugar as # 1,2,3 or # 4 on label. Eye Doctor - have an eye exam @ least annually.                                                          Please try to go on My Chart within the next 24 hours to allow me to release the results directly to you.  

## 2011-08-08 NOTE — Progress Notes (Signed)
Subjective:    Patient ID: Patrick Butler, male    DOB: 04/05/1961, 51 y.o.   MRN: [id number]  HPI  Patrick Butler is here for a physical;acute issues include weight gain of 15 # in past year despite dietary changes. He describes marked fatigue by bedtime. Nocturia is problem chronically but worse in the last several months; now every 2 months. He does have significant snoring; there's been no reported apnea. He had stopped using his CPAP following the extensive treatment for his tonsillar cancer and his bariatric surgery 2004-2005.      Review of Systems Neurosurgery in January for radiculopathy manifested by numbness from the knee down the right leg. Since his surgery he has residual numbness in the right great toe. He denies any incontinence of urine or stool. Labs done peri operatively were reviewed. BUN was 24 with a GFR of 71. CBC was normal. Glucose was 107.     Objective:   Physical Exam Gen.:  well-nourished in appearance. Alert, appropriate and cooperative throughout exam. Weight excess Head: Normocephalic without obvious abnormalities; pattern alopecia; moustache.  Eyes: No corneal or conjunctival inflammation noted. Pupils equal round reactive to light and accommodation. Fundal exam is benign without hemorrhages, exudate, papilledema. Extraocular motion intact. Vision grossly normal. Ears: External  ear exam reveals no significant lesions or deformities. Canals clear .TMs normal. Hearing is grossly normal bilaterally. Nose: External nasal exam reveals no deformity or inflammation. Nasal mucosa are pink and moist. No lesions or exudates noted.  Mouth: Oral mucosa and oropharynx reveal no lesions or exudates. Dentures present. Some oral pharyngeal crowding suggestive Neck: No deformities, masses, or tenderness noted. Range of motion normal. Thyroid substernal  Lungs: Normal respiratory effort; chest expands symmetrically. Lungs are clear to auscultation without rales, wheezes, or increased  work of breathing. Heart: Normal rate and rhythm. Normal S1 and S2. No gallop, click, or rub.S4 w/o murmur. Abdomen: Bowel sounds normal; abdomen soft and nontender. No masses, organomegaly or hernias noted.Proberant Genitalia /DRE: genitalia normal; panniculus. Minimal right lobe enlargement and slight induration without nodularity                                      Musculoskeletal/extremities: No deformity or scoliosis noted of  the thoracic or lumbar spine. No clubbing, cyanosis, edema, or deformity noted. Range of motion  normal .Tone & strength  normal.Joints normal. Nail health  Good. Op site well healed Vascular: Carotid, radial artery, dorsalis pedis and  posterior tibial pulses are full and equal. No bruits present. Neurologic: Alert and oriented x3. Deep tendon reflexes symmetrical and 1/2+.          Skin: Intact without suspicious lesions or rashes. Lymph: No cervical, axillary, or inguinal lymphadenopathy present. Psych: Mood and affect are normal. Normally interactive                                                                                         Assessment & Plan:  #1 comprehensive physical exam; no acute findings #2 see Problem List with Assessments & Recommendations Plan: see  Orders

## 2011-08-24 ENCOUNTER — Other Ambulatory Visit: Payer: Self-pay | Admitting: Internal Medicine

## 2011-08-30 ENCOUNTER — Encounter: Payer: Self-pay | Admitting: Pulmonary Disease

## 2011-08-30 ENCOUNTER — Ambulatory Visit (INDEPENDENT_AMBULATORY_CARE_PROVIDER_SITE_OTHER): Payer: BC Managed Care – PPO | Admitting: Pulmonary Disease

## 2011-08-30 VITALS — BP 140/100 | HR 91 | Temp 98.3°F | Ht 71.5 in | Wt 372.6 lb

## 2011-08-30 DIAGNOSIS — G4733 Obstructive sleep apnea (adult) (pediatric): Secondary | ICD-10-CM

## 2011-08-30 NOTE — Patient Instructions (Signed)
Will schedule for split night sleep study to see if you still have sleep apnea, and will also get your pressure adjusted at the same time on cpap. Work on [employer] loss Will call you with results.

## 2011-08-30 NOTE — Assessment & Plan Note (Signed)
The patient has a history of significant sleep apnea for which he was treated with CPAP.  He lost significant weight after gastric bypass surgery and radiation therapy for tonsillar cancer, but has now gained it all back.  He is now having a recurrence of classic symptoms for sleep disordered breathing.  I think he needs to have a split night study to reevaluate for sleep apnea, and also to optimize his pressure at the same time.  I have also encouraged him to work aggressively on weight loss.

## 2011-08-30 NOTE — Progress Notes (Signed)
  Subjective:    Patient ID: STORY VANVRANKEN, male    DOB: [birth year], 51 y.o.   MRN: [id number]  HPI The patient is a 51 year old male who I've been asked to see for possible obstructive sleep apnea.  He was diagnosed in 2002, and was started on CPAP, and did well with the device.  He used CPAP until 2005 when he had gastric bypass surgery and also radiation therapy for tonsillar cancer.  The patient lost down to 170 pounds, and was able to come off CPAP.  Since that time, he has gained significant weight pack, and is now starting to have sleeping issues.  He has been noted to have loud snoring, but no one has mentioned an abnormal breathing pattern during sleep.  He has frequent awakenings at night, and does not feel rested in the mornings upon arising.  He denies significant sleep pressure during the day until the late afternoon and evenings, but does have decreased energy throughout the day.  He denies any sleepiness with driving.  The patient's Epworth sleepiness score today is 3.  Sleep Questionnaire: What time do you typically go to bed?( Between what hours) 9:00pm to 10:00 pm How long does it take you to fall asleep? not long How many times during the night do you wake up? 6 What time do you get out of bed to start your day? 0600 Do you drive or operate heavy machinery in your occupation? Yes How much has your weight changed (up or down) over the past two years? (In pounds) 30 lb (13.608 kg) Have you ever had a sleep study before? Yes If yes, location of study? [facility] If yes, date of study? 2002 Do you currently use CPAP? No Do you wear oxygen at any time? No    Review of Systems  Constitutional: Positive for unexpected weight change. Negative for fever.  HENT: Positive for congestion and dental problem. Negative for ear pain, nosebleeds, sore throat, rhinorrhea, sneezing, trouble swallowing, postnasal drip and sinus pressure.   Eyes: Negative for redness and itching.  Respiratory: Positive for  shortness of breath. Negative for cough, chest tightness and wheezing.   Cardiovascular: Negative for palpitations and leg swelling.  Gastrointestinal: Negative for nausea and vomiting.  Genitourinary: Negative for dysuria.  Musculoskeletal: Positive for joint swelling.  Skin: Negative for rash.  Neurological: Negative for headaches.  Hematological: Does not bruise/bleed easily.  Psychiatric/Behavioral: Negative for dysphoric mood. The patient is not nervous/anxious.        Objective:   Physical Exam Constitutional:  Morbidly obese male, no acute distress  HENT:  Nares patent without discharge  Oropharynx without exudate, palate and uvula are elongated.   Eyes:  Perrla, eomi, no scleral icterus  Neck:  No JVD, no TMG  Cardiovascular:  Normal rate, regular rhythm, no rubs or gallops.  No murmurs        Intact distal pulses  Pulmonary :  Normal breath sounds, no stridor or respiratory distress   No rales, rhonchi, or wheezing  Abdominal:  Soft, nondistended, bowel sounds present.  No tenderness noted.   Musculoskeletal: mild lower extremity edema noted.  Lymph Nodes:  No cervical lymphadenopathy noted  Skin:  No cyanosis noted  Neurologic: appears sleepy but appropriate, moves all 4 extremities without obvious deficit.         Assessment & Plan:

## 2011-09-03 ENCOUNTER — Ambulatory Visit (HOSPITAL_BASED_OUTPATIENT_CLINIC_OR_DEPARTMENT_OTHER): Payer: BC Managed Care – PPO | Attending: Pulmonary Disease | Admitting: Radiology

## 2011-09-03 ENCOUNTER — Other Ambulatory Visit: Payer: Self-pay | Admitting: Internal Medicine

## 2011-09-03 VITALS — Ht 71.5 in | Wt 370.0 lb

## 2011-09-03 DIAGNOSIS — G4733 Obstructive sleep apnea (adult) (pediatric): Secondary | ICD-10-CM

## 2011-09-03 DIAGNOSIS — Z9989 Dependence on other enabling machines and devices: Secondary | ICD-10-CM

## 2011-09-18 ENCOUNTER — Encounter (HOSPITAL_BASED_OUTPATIENT_CLINIC_OR_DEPARTMENT_OTHER): Payer: BC Managed Care – PPO

## 2011-09-19 ENCOUNTER — Telehealth: Payer: Self-pay | Admitting: Pulmonary Disease

## 2011-09-19 DIAGNOSIS — G4733 Obstructive sleep apnea (adult) (pediatric): Secondary | ICD-10-CM

## 2011-09-19 NOTE — Procedures (Signed)
NAME:  Patrick Butler, Patrick Butler NO.:  [id number]  MEDICAL RECORD NO.:  [id number]          PATIENT TYPE:  OUT  LOCATION:  SLEEP CENTER                 FACILITY:  [facility]  PHYSICIAN:  Barbaraann Share, MD,FCCPDATE OF BIRTH:  29-Apr[birth year]1959  DATE OF STUDY:  09/03/2011                           NOCTURNAL POLYSOMNOGRAM  REFERRING PHYSICIAN:  Barbaraann Share, MD,FCCP  INDICATION FOR STUDY:  Hypersomnia with sleep apnea.  EPWORTH SLEEPINESS SCORE:  Three.  MEDICATIONS:  SLEEP ARCHITECTURE:  The patient had a total sleep time of 341 minute with no significant slow wave sleep and decreased quantity of REM. Sleep onset latency was normal at 33 minutes and REM onset was at the upper limits of normal at 118 minutes.  Sleep efficiency was 75% during the diagnostic portion of the study and 92% during the titration portion of the study.  RESPIRATORY DATA:  The patient underwent a split night protocol where he was found to have 58 obstructive events in the first 168 minutes of sleep.  This gave him an apnea-hypopnea index of 21 events per hour during the diagnostic portion of the study.  The events occurred in all body positions and there was loud snoring noted throughout.  By protocol, he was then fitted with a standard ResMed Mirage FX nasal CPAP mask, and CPAP titration was initiated.  He was found to have fairly good control of his obstructive events with a pressure of 16 cm of water.  He was noted to have later in the study mouth opening with leaks, and therefore will need a chinstrap for optimal treatment.  OXYGEN DATA:  There was O2 desaturation as low as 81% with the patient's obstructive events.  CARDIAC DATA:  No clinically significant arrhythmias were seen.  MOVEMENT-PARASOMNIA:  No significant leg jerks or other abnormal behaviors were noted.  IMPRESSIONS-RECOMMENDATIONS:  Split night study reveals moderate obstructive sleep apnea with an AHI of 21 events per hour and  O2 desaturation as low as 81% during the diagnostic portion of the study. The patient was then fitted with a standard ResMed Mirage FX nasal mask, and found to have an optimal CPAP pressure of 16 cm of water.  The patient will also need a chinstrap to help prevent oral venting.  He should also be encouraged to work aggressively on weight loss.     Barbaraann Share, MD,FCCP Diplomate, American Board of Sleep Medicine    [facility]/MEDQ  D:  09/19/2011 08:24:37  T:  09/19/2011 08:38:05  Job:  [id number]

## 2011-09-19 NOTE — Telephone Encounter (Signed)
Patient has been scheduled for follow-up with [facility] on Wed., 10/03/11 @ 4:30pm to discuss sleep results.

## 2011-10-03 ENCOUNTER — Encounter: Payer: Self-pay | Admitting: Pulmonary Disease

## 2011-10-03 ENCOUNTER — Ambulatory Visit (INDEPENDENT_AMBULATORY_CARE_PROVIDER_SITE_OTHER): Payer: BC Managed Care – PPO | Admitting: Pulmonary Disease

## 2011-10-03 VITALS — BP 138/80 | HR 123 | Temp 98.3°F | Ht 71.5 in | Wt 375.3 lb

## 2011-10-03 DIAGNOSIS — G4733 Obstructive sleep apnea (adult) (pediatric): Secondary | ICD-10-CM

## 2011-10-03 NOTE — Progress Notes (Signed)
  Subjective:    Patient ID: Patrick Butler, male    DOB: 07-30-1960, 51 y.o.   MRN: [id number]  HPI The patient comes in today for followup after his recent sleep study.  He was found to have an AHI of 21 events per hour during the diagnostic portion of the study, and found to have an optimal pressure of 16 cm during the therapeutic portion.  I have reviewed the study with him in detail, and answered all of his questions.   Review of Systems  Constitutional: Negative.  Negative for fever and unexpected weight change.  HENT: Positive for sneezing. Negative for ear pain, nosebleeds, congestion, sore throat, rhinorrhea, trouble swallowing, dental problem, postnasal drip and sinus pressure.   Eyes: Negative.  Negative for redness and itching.  Respiratory: Negative.  Negative for cough, chest tightness, shortness of breath and wheezing.   Cardiovascular: Positive for leg swelling. Negative for palpitations.  Gastrointestinal: Negative.  Negative for nausea and vomiting.  Genitourinary: Negative.  Negative for dysuria.  Musculoskeletal: Negative.  Negative for joint swelling.  Skin: Negative.  Negative for rash.  Neurological: Negative.  Negative for headaches.  Hematological: Negative.  Does not bruise/bleed easily.  Psychiatric/Behavioral: Negative.  Negative for dysphoric mood. The patient is not nervous/anxious.        Objective:   Physical Exam  Obese male in no acute distress Nose without purulence or discharge noted Lower extremities with edema noted, no cyanosis Awake, but appears to be mildly sleepy, moves all 4 extremities.      Assessment & Plan:

## 2011-10-03 NOTE — Patient Instructions (Signed)
Will restart on cpap at a moderate pressure, then gradually increase to your therapeutic level. Work on weight loss followup with me in 6mos if doing well.

## 2011-10-03 NOTE — Assessment & Plan Note (Signed)
The pt has moderate osa by his recent sleep study, and will start back on cpap for treatment.  Will start out on a moderate pressure, and increase to therapeutic level after a few weeks.  I have also encouraged him to work aggressively on weight loss.

## 2011-11-06 ENCOUNTER — Telehealth: Payer: Self-pay | Admitting: Internal Medicine

## 2011-11-06 MED ORDER — PRAVASTATIN SODIUM 20 MG PO TABS: ORAL_TABLET | ORAL | Status: DC

## 2011-11-06 MED ORDER — AMLODIPINE BESY-BENAZEPRIL HCL 5-20 MG PO CAPS: ORAL_CAPSULE | ORAL | Status: DC

## 2011-11-06 NOTE — Telephone Encounter (Signed)
Refill: Amlodipine/benazepril 5-20mg  #30. Take 1 capsule by mouth daily. Last fill 10-02-11

## 2011-11-06 NOTE — Telephone Encounter (Signed)
Refill: Pravastatin sodium 20mg  tab #90. Take 1 tablet by mouth every night at bedtime. Last fill 08-06-11

## 2011-11-13 ENCOUNTER — Other Ambulatory Visit: Payer: Self-pay | Admitting: Internal Medicine

## 2011-11-13 MED ORDER — LORAZEPAM 0.5 MG PO TABS: 0.5000 mg | ORAL_TABLET | Freq: Three times a day (TID) | ORAL | Status: DC | PRN

## 2011-11-13 NOTE — Telephone Encounter (Signed)
refill lorazepam 0.5mg  tab #30, last wrt 3.13.12 wt/3-refills - rx out of date, last ov 5.29.13 Last instructions Take 1 tablet by mouth 3 (three) times daily as needed. anxiety

## 2011-11-13 NOTE — Telephone Encounter (Signed)
RX sent

## 2011-11-20 ENCOUNTER — Telehealth: Payer: Self-pay | Admitting: Internal Medicine

## 2011-11-20 MED ORDER — FLUTICASONE PROPIONATE 50 MCG/ACT NA SUSP: 2.0000 | NASAL | Status: AC

## 2011-11-20 NOTE — Telephone Encounter (Signed)
Refill: Fluticasone propiona 47mcg/inh nose aer #16. Instill 2 sprays in each nostril daily. Last fill 10-02-11

## 2011-11-20 NOTE — Telephone Encounter (Signed)
RX sent in

## 2012-02-14 ENCOUNTER — Encounter: Payer: Self-pay | Admitting: Internal Medicine

## 2012-04-04 ENCOUNTER — Ambulatory Visit (INDEPENDENT_AMBULATORY_CARE_PROVIDER_SITE_OTHER): Payer: BC Managed Care – PPO | Admitting: Pulmonary Disease

## 2012-04-04 ENCOUNTER — Encounter: Payer: Self-pay | Admitting: Pulmonary Disease

## 2012-04-04 VITALS — BP 160/92 | HR 111 | Temp 97.2°F | Ht 72.0 in | Wt 379.0 lb

## 2012-04-04 DIAGNOSIS — G4733 Obstructive sleep apnea (adult) (pediatric): Secondary | ICD-10-CM

## 2012-04-04 NOTE — Patient Instructions (Addendum)
Stay on cpap, you are doing great Contact your DME to get mask cushions and supplies Work on weight loss followup with me in one year if doing well.

## 2012-04-04 NOTE — Assessment & Plan Note (Signed)
The patient is doing very well with CPAP by his download, and does feel that his sleep is better.  He is having some mask leaking, but is overdue for a new cushion.  I have asked him to continue with his CPAP as he is doing, and to work aggressively on weight loss.  He will followup with me in one year if doing well.

## 2012-04-04 NOTE — Progress Notes (Signed)
  Subjective:    Patient ID: Patrick Butler, male    DOB: 07-26-1960, 51 y.o.   MRN: [id number]  HPI The patient comes in today for followup of his obstructive sleep apnea.  He is wearing CPAP compliantly at his set pressure on his download, and his apnea-hypopnea index is well controlled.  He feels that he sleeps well with the device, but is restless at times because of chronic pain involving his shoulders.   Review of Systems  Constitutional: Negative for fever and unexpected weight change.  HENT: Positive for sneezing and dental problem. Negative for ear pain, nosebleeds, congestion, sore throat, rhinorrhea, trouble swallowing, postnasal drip and sinus pressure.   Eyes: Negative for redness and itching.  Respiratory: Negative for cough, chest tightness, shortness of breath and wheezing.   Cardiovascular: Negative for palpitations and leg swelling.  Gastrointestinal: Negative for nausea and vomiting.  Genitourinary: Negative for dysuria.  Musculoskeletal: Negative for joint swelling.  Skin: Negative for rash.  Neurological: Negative for headaches.  Hematological: Does not bruise/bleed easily.  Psychiatric/Behavioral: Negative for dysphoric mood. The patient is not nervous/anxious.        Objective:   Physical Exam Obese male in no acute distress Nose without purulence or discharge noted No skin breakdown or pressure necrosis from the CPAP mask Neck without lymphadenopathy or thyromegaly Lower extremities with minimal edema, cyanosis Alert and oriented, does not appear to be sleepy, moves all 4 extremities.       Assessment & Plan:

## 2012-06-06 ENCOUNTER — Other Ambulatory Visit: Payer: Self-pay | Admitting: Internal Medicine

## 2012-06-21 ENCOUNTER — Other Ambulatory Visit: Payer: Self-pay

## 2012-07-30 ENCOUNTER — Other Ambulatory Visit: Payer: Self-pay | Admitting: Internal Medicine

## 2012-08-04 ENCOUNTER — Other Ambulatory Visit: Payer: Self-pay | Admitting: Internal Medicine

## 2012-08-04 NOTE — Telephone Encounter (Signed)
Pending appointment may 2014

## 2012-08-19 ENCOUNTER — Other Ambulatory Visit: Payer: Self-pay | Admitting: Internal Medicine

## 2012-09-09 ENCOUNTER — Other Ambulatory Visit: Payer: Self-pay | Admitting: Internal Medicine

## 2012-09-19 ENCOUNTER — Encounter: Payer: Self-pay | Admitting: Lab

## 2012-09-22 ENCOUNTER — Encounter: Payer: Self-pay | Admitting: Internal Medicine

## 2012-09-22 ENCOUNTER — Ambulatory Visit (INDEPENDENT_AMBULATORY_CARE_PROVIDER_SITE_OTHER): Payer: BC Managed Care – PPO | Admitting: Internal Medicine

## 2012-09-22 VITALS — BP 138/94 | HR 96 | Temp 98.0°F | Resp 14 | Ht 71.0 in | Wt 372.0 lb

## 2012-09-22 DIAGNOSIS — Z0000 Encounter for general adult medical examination without abnormal findings: Secondary | ICD-10-CM

## 2012-09-22 DIAGNOSIS — R9431 Abnormal electrocardiogram [ECG] [EKG]: Secondary | ICD-10-CM

## 2012-09-22 DIAGNOSIS — I10 Essential (primary) hypertension: Secondary | ICD-10-CM

## 2012-09-22 DIAGNOSIS — E785 Hyperlipidemia, unspecified: Secondary | ICD-10-CM

## 2012-09-22 LAB — CBC WITH DIFFERENTIAL/PLATELET
Basophils Absolute: 0 10*3/uL (ref 0.0–0.1)
Basophils Relative: 0.5 % (ref 0.0–3.0)
HCT: 47.2 % (ref 39.0–52.0)
Hemoglobin: 15.9 g/dL (ref 13.0–17.0)
Lymphocytes Relative: 43.3 % (ref 12.0–46.0)
Lymphs Abs: 3.7 10*3/uL (ref 0.7–4.0)
MCHC: 33.6 g/dL (ref 30.0–36.0)
Monocytes Relative: 9 % (ref 3.0–12.0)
Neutro Abs: 4 10*3/uL (ref 1.4–7.7)
RBC: 5.31 Mil/uL (ref 4.22–5.81)
RDW: 15 % — ABNORMAL HIGH (ref 11.5–14.6)

## 2012-09-22 LAB — HEPATIC FUNCTION PANEL
AST: 30 U/L (ref 0–37)
Albumin: 3.9 g/dL (ref 3.5–5.2)
Alkaline Phosphatase: 60 U/L (ref 39–117)
Total Protein: 8 g/dL (ref 6.0–8.3)

## 2012-09-22 LAB — BASIC METABOLIC PANEL
CO2: 29 mEq/L (ref 19–32)
Calcium: 9.6 mg/dL (ref 8.4–10.5)
GFR: 71.17 mL/min (ref >60.00)
Glucose, Bld: 106 mg/dL — ABNORMAL HIGH (ref 70–99)
Potassium: 4.4 mEq/L (ref 3.5–5.1)
Sodium: 137 mEq/L (ref 135–145)

## 2012-09-22 LAB — LIPID PANEL: Cholesterol: 179 mg/dL (ref 0–200)

## 2012-09-22 LAB — TSH: TSH: 0.68 u[IU]/mL (ref 0.35–5.50)

## 2012-09-22 MED ORDER — AMLODIPINE BESY-BENAZEPRIL HCL 5-40 MG PO CAPS: 1.0000 | ORAL_CAPSULE | Freq: Every day | ORAL | Status: DC

## 2012-09-22 NOTE — Progress Notes (Signed)
  Subjective:    Patient ID: Patrick Butler, male    DOB: 1960/06/03, 52 y.o.   MRN: [id number]  HPI  [name] is here for a physical; he denies significant acute issues .     Review of Systems He is not on a heart healthy diet; he is not exercising symptoms. He denies chest pain,  dyspnea, or claudication. Family history is positive for premature coronary disease in 2 maternal uncles. He is compliant with statin & BP meds. Blood pressure has been variable 135-150/81-100.       Objective:   Physical Exam Gen.: well-nourished but weight excess. Alert, appropriate and cooperative throughout exam.  Head: Normocephalic without obvious abnormalities;head shaven  Eyes: No corneal or conjunctival inflammation noted.  Extraocular motion intact. Vision grossly normal without lenses Ears: External  ear exam reveals no significant lesions or deformities. Canals clear .TMs normal. Hearing is grossly normal bilaterally. Nose: External nasal exam reveals no deformity or inflammation. Nasal mucosa are pink and moist. No lesions or exudates noted.   Mouth: Oral mucosa and oropharynx reveal no lesions or exudates. Dentures. Neck: No deformities, masses, or tenderness noted. Range of motion & Thyroid normal. Lungs: Normal respiratory effort; chest expands symmetrically. Lungs are clear to auscultation without rales, wheezes, or increased work of breathing. Heart: Normal rate and rhythm. Normal S1 and S2. No gallop, click, or rub. S4 w/omurmur. Abdomen: Bowel sounds normal; abdomen protuberant but soft and nontender. No masses, organomegaly or hernias noted. Genitalia: Genitalia normal except for left varices. Prostate is normal without enlargement, asymmetry, nodularity, or induration.                                Musculoskeletal/extremities: No deformity or scoliosis noted of  the thoracic or lumbar spine.  No clubbing, cyanosis, edema, or significant extremity  deformity noted. Range of motion normal .Tone  & strength  Normal. Joints  reveal mild finger flexion R hand changes. Nail health good. Able to lie down & sit up w/o help. Negative SLR bilaterally Vascular: Carotid, radial artery, dorsalis pedis and  posterior tibial pulses are full and equal. No bruits present. Neurologic: Alert and oriented x3. Deep tendon reflexes symmetrical and 1/2+.        Skin: Intact without suspicious lesions or rashes. Lymph: No cervical, axillary, or inguinal lymphadenopathy present. Psych: Mood and affect are normal. Normally interactive                                                                                        Assessment & Plan:  #1 comprehensive physical exam; no acute findings  Plan: see Orders  & Recommendations

## 2012-09-22 NOTE — Patient Instructions (Addendum)
Preventive Health Care: Exercise at least 30-45 minutes a day,  3-4 days a week.  Eat a low-fat diet with lots of fruits and vegetables, up to 7-9 servings per day. This would eliminate the need for vitamin supplements. Consume less than 40 grams of sugar (preferably ZERO) per day from foods & drinks with High Fructose Corn Sugar as #1,2,3 or # 4 on label. Minimal Blood Pressure Goal= AVERAGE < 140/90;  Ideal is an AVERAGE < 135/85. This AVERAGE should be calculated from @ least 5-7 BP readings taken @ different times of day on different days of week. You should not respond to isolated BP readings , but rather the AVERAGE for that week .Please bring your  blood pressure cuff to office visits to verify that it is reliable.It  can also be checked against the blood pressure device at the pharmacy. Finger or wrist cuffs are not dependable; an arm cuff is. Take the EKG to any emergency room or preop visits. There are nonspecific changes; as long as there is no new change these are not clinically significant . If the old EKG is not available for comparison; it may result in unnecessary hospitalization for observation with significant unnecessary expense.

## 2012-09-23 ENCOUNTER — Ambulatory Visit: Payer: BC Managed Care – PPO

## 2012-09-23 DIAGNOSIS — R7309 Other abnormal glucose: Secondary | ICD-10-CM

## 2012-09-23 LAB — HEMOGLOBIN A1C: Hgb A1c MFr Bld: 6.8 % — ABNORMAL HIGH (ref 4.6–6.5)

## 2012-09-24 ENCOUNTER — Encounter: Payer: Self-pay | Admitting: Internal Medicine

## 2012-09-24 ENCOUNTER — Other Ambulatory Visit: Payer: Self-pay | Admitting: *Deleted

## 2012-09-24 DIAGNOSIS — E119 Type 2 diabetes mellitus without complications: Secondary | ICD-10-CM

## 2012-09-24 MED ORDER — METFORMIN HCL 500 MG PO TABS: 500.0000 mg | ORAL_TABLET | Freq: Two times a day (BID) | ORAL | Status: DC

## 2012-10-06 ENCOUNTER — Encounter: Payer: Self-pay | Admitting: Internal Medicine

## 2012-11-04 ENCOUNTER — Other Ambulatory Visit: Payer: Self-pay | Admitting: Internal Medicine

## 2012-11-04 NOTE — Telephone Encounter (Signed)
Refill done per protocol.  

## 2012-11-19 ENCOUNTER — Other Ambulatory Visit: Payer: Self-pay | Admitting: Internal Medicine

## 2012-12-09 ENCOUNTER — Other Ambulatory Visit: Payer: Self-pay | Admitting: Internal Medicine

## 2012-12-30 ENCOUNTER — Other Ambulatory Visit (INDEPENDENT_AMBULATORY_CARE_PROVIDER_SITE_OTHER): Payer: BC Managed Care – PPO

## 2012-12-30 DIAGNOSIS — E119 Type 2 diabetes mellitus without complications: Secondary | ICD-10-CM

## 2012-12-30 LAB — MICROALBUMIN / CREATININE URINE RATIO: Microalb Creat Ratio: 0.1 mg/g (ref 0.0–30.0)

## 2012-12-30 LAB — HEMOGLOBIN A1C: Hgb A1c MFr Bld: 6.6 % — ABNORMAL HIGH (ref 4.6–6.5)

## 2012-12-31 ENCOUNTER — Encounter: Payer: Self-pay | Admitting: Internal Medicine

## 2012-12-31 DIAGNOSIS — E119 Type 2 diabetes mellitus without complications: Secondary | ICD-10-CM

## 2013-01-01 ENCOUNTER — Encounter: Payer: Self-pay | Admitting: Internal Medicine

## 2013-01-01 MED ORDER — METFORMIN HCL 500 MG PO TABS: 500.0000 mg | ORAL_TABLET | Freq: Two times a day (BID) | ORAL | Status: DC

## 2013-01-01 NOTE — Telephone Encounter (Signed)
Rx sent to the pharmacy by e-script.//AB/CMA 

## 2013-01-09 ENCOUNTER — Telehealth: Payer: Self-pay | Admitting: Internal Medicine

## 2013-01-09 DIAGNOSIS — E039 Hypothyroidism, unspecified: Secondary | ICD-10-CM

## 2013-01-09 NOTE — Telephone Encounter (Signed)
Future order for TSH was sent.//AB/CMA

## 2013-01-09 NOTE — Telephone Encounter (Signed)
Patient requested appt through MyChart for a1c and TSH in 4 months. Orders are placed for a1c, but can you add the TSH?

## 2013-03-12 ENCOUNTER — Other Ambulatory Visit: Payer: Self-pay

## 2013-03-16 ENCOUNTER — Other Ambulatory Visit: Payer: Self-pay | Admitting: Internal Medicine

## 2013-03-16 NOTE — Telephone Encounter (Signed)
OK X1 

## 2013-03-16 NOTE — Telephone Encounter (Signed)
LORazepam (ATIVAN) 0.5 MG tablet Last refill: 11/13/2011 Last OV: 09/22/2012 UDS up-to-date: Low Risk

## 2013-03-31 ENCOUNTER — Other Ambulatory Visit: Payer: Self-pay | Admitting: Internal Medicine

## 2013-03-31 NOTE — Telephone Encounter (Signed)
Metformin refilled per protocol 

## 2013-04-06 ENCOUNTER — Ambulatory Visit (INDEPENDENT_AMBULATORY_CARE_PROVIDER_SITE_OTHER): Payer: BC Managed Care – PPO | Admitting: Pulmonary Disease

## 2013-04-06 ENCOUNTER — Encounter: Payer: Self-pay | Admitting: Pulmonary Disease

## 2013-04-06 VITALS — BP 128/72 | HR 98 | Temp 98.1°F | Ht 71.5 in | Wt 373.0 lb

## 2013-04-06 DIAGNOSIS — G4733 Obstructive sleep apnea (adult) (pediatric): Secondary | ICD-10-CM

## 2013-04-06 DIAGNOSIS — Z23 Encounter for immunization: Secondary | ICD-10-CM

## 2013-04-06 NOTE — Assessment & Plan Note (Signed)
The patient is doing well from a sleep apnea standpoint on his CPAP device. He has lost weight since last visit, and I have encouraged him to continue doing so. He is overdue for supplies, and we will send an order to his home care company. I would like for him to followup with me again in one year, but call if he is having issues.

## 2013-04-06 NOTE — Progress Notes (Signed)
   Subjective:    Patient ID: Patrick Butler, male    DOB: [birth year], 52 y.o.   MRN: [id number]  HPI Patient comes in today for followup of his obstructive sleep apnea. He is wearing CPAP compliantly by his family, and has excellent control of his AHI. He is having no significant issues with his mask fit, but is overdue for new cushions and supplies. He feels that he is sleeping well with the device, and has adequate daytime alertness. Of note, his weight is down 6 pounds since last visit.   Review of Systems  Constitutional: Negative for fever and unexpected weight change.  HENT: Negative for congestion, dental problem, ear pain, nosebleeds, postnasal drip, rhinorrhea, sinus pressure, sneezing, sore throat and trouble swallowing.   Eyes: Negative for redness and itching.  Respiratory: Negative for cough, chest tightness, shortness of breath and wheezing.   Cardiovascular: Negative for palpitations and leg swelling.  Gastrointestinal: Negative for nausea and vomiting.  Genitourinary: Negative for dysuria.  Musculoskeletal: Negative for joint swelling.  Skin: Negative for rash.  Neurological: Negative for headaches.  Hematological: Does not bruise/bleed easily.  Psychiatric/Behavioral: Negative for dysphoric mood. The patient is not nervous/anxious.        Objective:   Physical Exam Obese male in no acute distress Nose without purulence or discharge noted Neck without lymphadenopathy or thyromegaly No skin breakdown or pressure necrosis from the CPAP mask Lower extremities with edema noted, no cyanosis Alert and oriented, moves all 4 extremities.       Assessment & Plan:

## 2013-04-06 NOTE — Patient Instructions (Signed)
Will give you the flu shot today. Continue on cpap, and we will send an order to your home care company to get you new supplies. Keep working on weight loss followup with me in one year if doing well.

## 2013-05-05 ENCOUNTER — Other Ambulatory Visit: Payer: Self-pay | Admitting: Internal Medicine

## 2013-05-05 NOTE — Telephone Encounter (Signed)
Pravastatin refilled per protocol. JG//CMA 

## 2013-05-12 ENCOUNTER — Other Ambulatory Visit (INDEPENDENT_AMBULATORY_CARE_PROVIDER_SITE_OTHER): Payer: BC Managed Care – PPO

## 2013-05-12 DIAGNOSIS — E119 Type 2 diabetes mellitus without complications: Secondary | ICD-10-CM

## 2013-05-12 DIAGNOSIS — E039 Hypothyroidism, unspecified: Secondary | ICD-10-CM

## 2013-05-12 LAB — HEMOGLOBIN A1C: Hgb A1c MFr Bld: 6.6 % — ABNORMAL HIGH (ref 4.6–6.5)

## 2013-05-12 LAB — TSH: TSH: 4.07 u[IU]/mL (ref 0.35–5.50)

## 2013-06-01 ENCOUNTER — Other Ambulatory Visit: Payer: Self-pay | Admitting: Internal Medicine

## 2013-06-01 NOTE — Telephone Encounter (Signed)
Levothyroxine refilled per protocol. JG//CMA 

## 2013-06-12 ENCOUNTER — Other Ambulatory Visit: Payer: Self-pay | Admitting: Internal Medicine

## 2013-06-12 NOTE — Telephone Encounter (Signed)
Rx sent to the pharmacy by e-script.//AB/CMA 

## 2013-07-31 ENCOUNTER — Other Ambulatory Visit: Payer: Self-pay | Admitting: Internal Medicine

## 2013-07-31 MED ORDER — LORAZEPAM 0.5 MG PO TABS: ORAL_TABLET | ORAL | Status: DC

## 2013-07-31 NOTE — Telephone Encounter (Signed)
Requesting Lorazepam 0.5mg -Take 1 tablet by mouth 3 times daily as needed only. Last refill:03-16-13'#60,0 Last OV:09-22-12 UDS:09-22-12 Please advise.//AB/CMA

## 2013-07-31 NOTE — Telephone Encounter (Signed)
Rx printed and faxed to the pharmacy.//AB/CMA 

## 2013-09-07 ENCOUNTER — Other Ambulatory Visit: Payer: Self-pay | Admitting: Internal Medicine

## 2013-09-23 ENCOUNTER — Encounter: Payer: BC Managed Care – PPO | Admitting: Internal Medicine

## 2013-10-07 ENCOUNTER — Other Ambulatory Visit (INDEPENDENT_AMBULATORY_CARE_PROVIDER_SITE_OTHER): Payer: BC Managed Care – PPO

## 2013-10-07 ENCOUNTER — Telehealth: Payer: Self-pay | Admitting: Internal Medicine

## 2013-10-07 ENCOUNTER — Encounter: Payer: Self-pay | Admitting: Internal Medicine

## 2013-10-07 ENCOUNTER — Ambulatory Visit (INDEPENDENT_AMBULATORY_CARE_PROVIDER_SITE_OTHER): Payer: BC Managed Care – PPO | Admitting: Internal Medicine

## 2013-10-07 VITALS — BP 156/94 | HR 99 | Temp 98.3°F | Ht 71.5 in | Wt 357.8 lb

## 2013-10-07 DIAGNOSIS — E8881 Metabolic syndrome: Secondary | ICD-10-CM

## 2013-10-07 DIAGNOSIS — E039 Hypothyroidism, unspecified: Secondary | ICD-10-CM

## 2013-10-07 DIAGNOSIS — R399 Unspecified symptoms and signs involving the genitourinary system: Secondary | ICD-10-CM

## 2013-10-07 DIAGNOSIS — Z0000 Encounter for general adult medical examination without abnormal findings: Secondary | ICD-10-CM

## 2013-10-07 DIAGNOSIS — I10 Essential (primary) hypertension: Secondary | ICD-10-CM

## 2013-10-07 DIAGNOSIS — E785 Hyperlipidemia, unspecified: Secondary | ICD-10-CM

## 2013-10-07 DIAGNOSIS — R3989 Other symptoms and signs involving the genitourinary system: Secondary | ICD-10-CM

## 2013-10-07 LAB — CBC WITH DIFFERENTIAL/PLATELET
BASOS ABS: 0 10*3/uL (ref 0.0–0.1)
Basophils Relative: 0.3 % (ref 0.0–3.0)
EOS PCT: 1.1 % (ref 0.0–5.0)
Eosinophils Absolute: 0.1 10*3/uL (ref 0.0–0.7)
HCT: 46.1 % (ref 39.0–52.0)
Hemoglobin: 15.2 g/dL (ref 13.0–17.0)
LYMPHS ABS: 3.5 10*3/uL (ref 0.7–4.0)
Lymphocytes Relative: 38.4 % (ref 12.0–46.0)
MCHC: 33 g/dL (ref 30.0–36.0)
MCV: 89.7 fl (ref 78.0–100.0)
MONO ABS: 0.7 10*3/uL (ref 0.1–1.0)
Monocytes Relative: 7.4 % (ref 3.0–12.0)
NEUTROS PCT: 52.8 % (ref 43.0–77.0)
Neutro Abs: 4.8 10*3/uL (ref 1.4–7.7)
PLATELETS: 182 10*3/uL (ref 150.0–400.0)
RBC: 5.14 Mil/uL (ref 4.22–5.81)
RDW: 14.7 % (ref 11.5–15.5)
WBC: 9.1 10*3/uL (ref 4.0–10.5)

## 2013-10-07 LAB — HEMOGLOBIN A1C: Hgb A1c MFr Bld: 6.7 % — ABNORMAL HIGH (ref 4.6–6.5)

## 2013-10-07 LAB — HEPATIC FUNCTION PANEL
ALBUMIN: 3.9 g/dL (ref 3.5–5.2)
ALK PHOS: 51 U/L (ref 39–117)
ALT: 35 U/L (ref 0–53)
AST: 27 U/L (ref 0–37)
Bilirubin, Direct: 0.1 mg/dL (ref 0.0–0.3)
Total Bilirubin: 0.9 mg/dL (ref 0.2–1.2)
Total Protein: 7.1 g/dL (ref 6.0–8.3)

## 2013-10-07 LAB — TSH: TSH: 1.93 u[IU]/mL (ref 0.35–4.50)

## 2013-10-07 LAB — LIPID PANEL
CHOLESTEROL: 156 mg/dL (ref 0–200)
HDL: 38.2 mg/dL — ABNORMAL LOW (ref >39.00)
LDL CALC: 87 mg/dL (ref 0–99)
NonHDL: 117.8
Total CHOL/HDL Ratio: 4
Triglycerides: 156 mg/dL — ABNORMAL HIGH (ref 0.0–149.0)
VLDL: 31.2 mg/dL (ref 0.0–40.0)

## 2013-10-07 LAB — BASIC METABOLIC PANEL
BUN: 18 mg/dL (ref 6–23)
CHLORIDE: 103 meq/L (ref 96–112)
CO2: 27 mEq/L (ref 19–32)
CREATININE: 1.1 mg/dL (ref 0.4–1.5)
Calcium: 9.5 mg/dL (ref 8.4–10.5)
GFR: 74.66 mL/min (ref >60.00)
GLUCOSE: 126 mg/dL — AB (ref 70–99)
Potassium: 4.7 mEq/L (ref 3.5–5.1)
Sodium: 137 mEq/L (ref 135–145)

## 2013-10-07 LAB — PSA: PSA: 0.57 ng/mL (ref 0.10–4.00)

## 2013-10-07 NOTE — Progress Notes (Signed)
Pre visit review using our clinic review tool, if applicable. No additional management support is needed unless otherwise documented below in the visit note. 

## 2013-10-07 NOTE — Progress Notes (Signed)
Subjective:    Patient ID: Patrick Butler, male    DOB: 1960/06/15, 53 y.o.   MRN: [id number]  HPI    He is here for a physical;acute issues include lower urinary tract symptoms  Blood pressure range / average : no monitor Compliant with anti hypertemsive medication. No lightheadedness or other adverse medication effect described.  No on  heart healthy /low salt diet. No exercise program.  Review of Systems  He describes urgency as well as some slight leakage. He has nocturia up to 4 times per night. The latter is somewhat variable. He has a history of prostatic hypertrophy but has had no urologic evaluation to date.  He also describes intermittent pain in the right posterior neck which radiates over the head to the right frontal area. This is positional when he tends to rotate head laterally and superiorly. Significant headaches, epistaxis, chest pain, palpitations, exertional dyspnea, claudication, paroxysmal nocturnal dyspnea, or edema absent.  Some nocturnal leg cramps RLE.      Objective:   Physical Exam  Significant or distinguishing  findings on physical exam are documented first.  Below that are other systems examined & findings.  Morbid obesity. Alopecia, pattern. He has a mustache as well as mutton chops. Wax is noted in the right canal. He has complete dentures. Abdomen is massive as is his panniculus. Prostate is not significantly enlarged, but  it is asymmetric. Right lobe is greater than left without nodularity or induration. The left knee reflex is absent   Gen.: adequately nourished in appearance. Alert, appropriate and cooperative throughout exam.  Head: Normocephalic without obvious abnormalities  Eyes: No corneal or conjunctival inflammation noted. Pupils equal round reactive to light and accommodation. Extraocular motion intact.  Ears: External  ear exam reveals no significant lesions or deformities. Hearing is grossly normal bilaterally. Nose: External  nasal exam reveals no deformity or inflammation. Nasal mucosa are dry. No lesions or exudates noted.   Mouth: Oral mucosa and oropharynx reveal no lesions or exudates.  Neck: No deformities, masses, or tenderness noted. Range of motion slightly decreased. Thyroid not palpable. Lungs: Normal respiratory effort; chest expands symmetrically. Lungs are clear to auscultation without rales, wheezes, or increased work of breathing. Heart: Normal rate and rhythm. Normal S1 and S2. No gallop, click, or rub. No murmur. Abdomen: Bowel sounds normal; abdomen soft and nontender. No masses, organomegaly or hernias noted. Genital exam reveals varices on the left ;otherwise normal.                           Musculoskeletal/extremities: No deformity or scoliosis noted of  the thoracic or lumbar spine.  No clubbing, cyanosis, edema, or significant extremity  deformity noted. Range of motion normal .Tone & strength normal. Hand joints normal Fingernail  health good. Able to lie down & sit up w/o help. Negative SLR bilaterally Vascular: Carotid, radial artery, dorsalis pedis and  posterior tibial pulses are full and equal. No bruits present. Neurologic: Alert and oriented x3.  Gait normal     Skin: Intact without suspicious lesions or rashes. Lymph: No cervical, axillary, or inguinal lymphadenopathy present. Psych: Mood and affect are normal. Normally interactive  Assessment & Plan:  #1 comprehensive physical exam; no acute findings #2 LUTS w/o BPH  Plan: see Orders  & Recommendations

## 2013-10-07 NOTE — Patient Instructions (Signed)

## 2013-10-07 NOTE — Telephone Encounter (Signed)
Relevant patient education assigned to patient using Emmi. ° °

## 2013-10-08 DIAGNOSIS — R399 Unspecified symptoms and signs involving the genitourinary system: Secondary | ICD-10-CM | POA: Insufficient documentation

## 2013-10-09 ENCOUNTER — Encounter: Payer: Self-pay | Admitting: Internal Medicine

## 2013-10-15 ENCOUNTER — Other Ambulatory Visit: Payer: Self-pay | Admitting: Internal Medicine

## 2013-11-02 ENCOUNTER — Other Ambulatory Visit: Payer: Self-pay | Admitting: Internal Medicine

## 2013-11-19 ENCOUNTER — Other Ambulatory Visit: Payer: Self-pay

## 2013-11-19 MED ORDER — METFORMIN HCL 500 MG PO TABS: ORAL_TABLET | ORAL | Status: DC

## 2013-12-09 ENCOUNTER — Other Ambulatory Visit: Payer: Self-pay | Admitting: Internal Medicine

## 2014-01-12 ENCOUNTER — Other Ambulatory Visit: Payer: Self-pay | Admitting: Internal Medicine

## 2014-01-12 DIAGNOSIS — E119 Type 2 diabetes mellitus without complications: Secondary | ICD-10-CM

## 2014-01-12 DIAGNOSIS — E1140 Type 2 diabetes mellitus with diabetic neuropathy, unspecified: Secondary | ICD-10-CM | POA: Insufficient documentation

## 2014-01-13 ENCOUNTER — Telehealth: Payer: Self-pay | Admitting: Internal Medicine

## 2014-01-13 NOTE — Telephone Encounter (Signed)
Patient has questions in regards to his preventative health mantiance on mychart and in regards to his recent labs.

## 2014-01-13 NOTE — Telephone Encounter (Signed)
Pt return call back md wanted him to repeat labs. Inform pt md already place labs in computer just have to go to lab does not need to see md....lmb

## 2014-01-13 NOTE — Telephone Encounter (Signed)
Called pt no anwer LMOM RTC...Patrick Butler

## 2014-01-14 ENCOUNTER — Other Ambulatory Visit (INDEPENDENT_AMBULATORY_CARE_PROVIDER_SITE_OTHER): Payer: BC Managed Care – PPO

## 2014-01-14 DIAGNOSIS — E119 Type 2 diabetes mellitus without complications: Secondary | ICD-10-CM

## 2014-01-14 LAB — HEMOGLOBIN A1C: Hgb A1c MFr Bld: 6.5 % (ref 4.6–6.5)

## 2014-01-22 ENCOUNTER — Other Ambulatory Visit: Payer: Self-pay | Admitting: Internal Medicine

## 2014-01-22 NOTE — Telephone Encounter (Signed)
OK X1 

## 2014-02-10 ENCOUNTER — Other Ambulatory Visit: Payer: Self-pay

## 2014-02-10 MED ORDER — AMLODIPINE BESY-BENAZEPRIL HCL 5-40 MG PO CAPS: ORAL_CAPSULE | ORAL | Status: DC

## 2014-04-06 ENCOUNTER — Ambulatory Visit: Payer: BC Managed Care – PPO | Admitting: Pulmonary Disease

## 2014-04-15 ENCOUNTER — Other Ambulatory Visit: Payer: Self-pay | Admitting: Internal Medicine

## 2014-04-15 NOTE — Telephone Encounter (Signed)
OK X1 

## 2014-04-15 NOTE — Telephone Encounter (Signed)
Lorazepam has been called to [facility]

## 2014-05-20 ENCOUNTER — Ambulatory Visit: Payer: BC Managed Care – PPO | Admitting: Pulmonary Disease

## 2014-06-01 ENCOUNTER — Ambulatory Visit (INDEPENDENT_AMBULATORY_CARE_PROVIDER_SITE_OTHER): Payer: BLUE CROSS/BLUE SHIELD | Admitting: Pulmonary Disease

## 2014-06-01 ENCOUNTER — Other Ambulatory Visit: Payer: Self-pay | Admitting: Internal Medicine

## 2014-06-01 ENCOUNTER — Encounter: Payer: Self-pay | Admitting: Pulmonary Disease

## 2014-06-01 ENCOUNTER — Telehealth: Payer: Self-pay | Admitting: Internal Medicine

## 2014-06-01 VITALS — BP 132/78 | HR 82 | Temp 98.0°F | Ht 71.5 in | Wt 368.0 lb

## 2014-06-01 DIAGNOSIS — Z23 Encounter for immunization: Secondary | ICD-10-CM | POA: Diagnosis not present

## 2014-06-01 DIAGNOSIS — G4733 Obstructive sleep apnea (adult) (pediatric): Secondary | ICD-10-CM

## 2014-06-01 DIAGNOSIS — E119 Type 2 diabetes mellitus without complications: Secondary | ICD-10-CM

## 2014-06-01 NOTE — Telephone Encounter (Signed)
Pt send massage from my chart request an A1C done. Please advise, does he need an appt before he can get this lab order.

## 2014-06-01 NOTE — Patient Instructions (Signed)
Continue on cpap, and keep up with mask changes and supplies. Keep working on weight loss Will give you a flu shot today followup with me again in one year if doing well.

## 2014-06-01 NOTE — Assessment & Plan Note (Signed)
The patient continues to do well with C Pap, and his download shows great compliance and good control of his AHI. I have asked him to keep up with his mask changes and supplies, and to keep working on weight loss. There is not a lot to do for his dryness, since he is using all of the water in his humidifier. He has had radiation to his neck and lower jaw, and tells me that his salivary glands are dysfunctional. I have asked him to consider artificial saliva at night to see if that will help.

## 2014-06-01 NOTE — Addendum Note (Signed)
Addended by: Inge Rise on: 06/01/2014 09:27 AM   Modules accepted: Orders

## 2014-06-01 NOTE — Telephone Encounter (Signed)
Order entered

## 2014-06-01 NOTE — Progress Notes (Signed)
   Subjective:    Patient ID: Patrick Butler, male    DOB: 1961/02/03, 54 y.o.   MRN: [id number]  HPI The patient comes in today for follow-up of his obstructive sleep apnea. He is wearing C Pap compliantly by his download, with excellent control of his AHI. He is having no mask leak issues, and feels the pressure is adequate. He sleeps fairly well with the device, and feels that his daytime alertness is excellent. His only complaint today is that of mouth dryness, despite using a heated humidifier. He has had radiation to his neck which affected his salivary glands. It should be noted the patient's weight is down 5 pounds from the last visit.   Review of Systems  Constitutional: Negative for fever and unexpected weight change.  HENT: Negative for congestion, dental problem, ear pain, nosebleeds, postnasal drip, rhinorrhea, sinus pressure, sneezing, sore throat and trouble swallowing.   Eyes: Negative for redness and itching.  Respiratory: Negative for cough, chest tightness, shortness of breath and wheezing.   Cardiovascular: Negative for palpitations and leg swelling.  Gastrointestinal: Negative for nausea and vomiting.  Genitourinary: Negative for dysuria.  Musculoskeletal: Negative for joint swelling.  Skin: Negative for rash.  Neurological: Negative for headaches.  Hematological: Does not bruise/bleed easily.  Psychiatric/Behavioral: Negative for dysphoric mood. The patient is not nervous/anxious.        Objective:   Physical Exam Obese male in no acute distress Nose without purulence or discharge noted Neck without lymphadenopathy or thyromegaly No skin breakdown or pressure necrosis from the C Pap mask Lower extremities with edema noted, no cyanosis Alert and oriented, moves all 4 extremities.       Assessment & Plan:

## 2014-06-02 NOTE — Telephone Encounter (Signed)
Sent massage through my chart to inform pt to come for a lab work.

## 2014-06-09 ENCOUNTER — Other Ambulatory Visit: Payer: Self-pay | Admitting: Internal Medicine

## 2014-06-11 ENCOUNTER — Other Ambulatory Visit: Payer: Self-pay | Admitting: Internal Medicine

## 2014-07-20 ENCOUNTER — Other Ambulatory Visit (INDEPENDENT_AMBULATORY_CARE_PROVIDER_SITE_OTHER): Payer: BLUE CROSS/BLUE SHIELD

## 2014-07-20 ENCOUNTER — Other Ambulatory Visit: Payer: Self-pay | Admitting: Internal Medicine

## 2014-07-20 DIAGNOSIS — E119 Type 2 diabetes mellitus without complications: Secondary | ICD-10-CM | POA: Diagnosis not present

## 2014-07-20 LAB — MICROALBUMIN / CREATININE URINE RATIO
CREATININE, U: 44.7 mg/dL
MICROALB/CREAT RATIO: 1.6 mg/g (ref 0.0–30.0)
Microalb, Ur: <0.7 mg/dL (ref 0.0–1.9)

## 2014-07-20 LAB — HEMOGLOBIN A1C: Hgb A1c MFr Bld: 6.8 % — ABNORMAL HIGH (ref 4.6–6.5)

## 2014-07-28 ENCOUNTER — Other Ambulatory Visit: Payer: Self-pay | Admitting: Otolaryngology

## 2014-07-28 DIAGNOSIS — R1310 Dysphagia, unspecified: Secondary | ICD-10-CM

## 2014-07-29 ENCOUNTER — Ambulatory Visit
Admission: RE | Admit: 2014-07-29 | Discharge: 2014-07-29 | Disposition: A | Discharge status: Home / Self Care | Payer: BLUE CROSS/BLUE SHIELD | Source: Ambulatory Visit | Attending: Otolaryngology | Admitting: Otolaryngology

## 2014-07-29 DIAGNOSIS — R1310 Dysphagia, unspecified: Secondary | ICD-10-CM

## 2014-08-06 DIAGNOSIS — R1310 Dysphagia, unspecified: Secondary | ICD-10-CM

## 2014-08-06 HISTORY — DX: Dysphagia, unspecified: R13.10

## 2014-08-16 ENCOUNTER — Encounter (HOSPITAL_BASED_OUTPATIENT_CLINIC_OR_DEPARTMENT_OTHER): Payer: Self-pay | Admitting: *Deleted

## 2014-08-16 NOTE — Pre-Procedure Instructions (Signed)
To come for BMET and anesthesia airway evaluation. 

## 2014-08-18 NOTE — Progress Notes (Signed)
Chart reviewed with Dr Deatra Canter. OK for [facility]

## 2014-08-19 ENCOUNTER — Other Ambulatory Visit: Payer: Self-pay | Admitting: Otolaryngology

## 2014-08-19 ENCOUNTER — Encounter (HOSPITAL_BASED_OUTPATIENT_CLINIC_OR_DEPARTMENT_OTHER)
Admission: RE | Admit: 2014-08-19 | Discharge: 2014-08-19 | Disposition: A | Discharge status: Home / Self Care | Payer: BLUE CROSS/BLUE SHIELD | Source: Ambulatory Visit | Attending: Otolaryngology | Admitting: Otolaryngology

## 2014-08-19 DIAGNOSIS — Z87891 Personal history of nicotine dependence: Secondary | ICD-10-CM | POA: Diagnosis not present

## 2014-08-19 DIAGNOSIS — Z880 Allergy status to penicillin: Secondary | ICD-10-CM | POA: Diagnosis not present

## 2014-08-19 DIAGNOSIS — Z923 Personal history of irradiation: Secondary | ICD-10-CM | POA: Diagnosis not present

## 2014-08-19 DIAGNOSIS — F419 Anxiety disorder, unspecified: Secondary | ICD-10-CM | POA: Diagnosis not present

## 2014-08-19 DIAGNOSIS — G4730 Sleep apnea, unspecified: Secondary | ICD-10-CM | POA: Diagnosis not present

## 2014-08-19 DIAGNOSIS — R1310 Dysphagia, unspecified: Secondary | ICD-10-CM | POA: Diagnosis present

## 2014-08-19 DIAGNOSIS — K222 Esophageal obstruction: Secondary | ICD-10-CM | POA: Diagnosis not present

## 2014-08-19 DIAGNOSIS — M1990 Unspecified osteoarthritis, unspecified site: Secondary | ICD-10-CM | POA: Diagnosis not present

## 2014-08-19 DIAGNOSIS — E039 Hypothyroidism, unspecified: Secondary | ICD-10-CM | POA: Diagnosis not present

## 2014-08-19 DIAGNOSIS — I10 Essential (primary) hypertension: Secondary | ICD-10-CM | POA: Diagnosis not present

## 2014-08-19 DIAGNOSIS — K219 Gastro-esophageal reflux disease without esophagitis: Secondary | ICD-10-CM | POA: Diagnosis not present

## 2014-08-19 DIAGNOSIS — Z8521 Personal history of malignant neoplasm of larynx: Secondary | ICD-10-CM | POA: Diagnosis not present

## 2014-08-19 DIAGNOSIS — Z9884 Bariatric surgery status: Secondary | ICD-10-CM | POA: Diagnosis not present

## 2014-08-19 DIAGNOSIS — E785 Hyperlipidemia, unspecified: Secondary | ICD-10-CM | POA: Diagnosis not present

## 2014-08-19 DIAGNOSIS — Z96651 Presence of right artificial knee joint: Secondary | ICD-10-CM | POA: Diagnosis not present

## 2014-08-19 DIAGNOSIS — Z7982 Long term (current) use of aspirin: Secondary | ICD-10-CM | POA: Diagnosis not present

## 2014-08-19 LAB — BASIC METABOLIC PANEL
ANION GAP: 8 (ref 5–15)
BUN: 14 mg/dL (ref 6–23)
CALCIUM: 9.8 mg/dL (ref 8.4–10.5)
CO2: 30 mmol/L (ref 19–32)
Chloride: 100 mmol/L (ref 96–112)
Creatinine, Ser: 1.09 mg/dL (ref 0.50–1.35)
GFR calc Af Amer: 87 mL/min — ABNORMAL LOW (ref >90)
GFR, EST NON AFRICAN AMERICAN: 75 mL/min — AB (ref >90)
Glucose, Bld: 144 mg/dL — ABNORMAL HIGH (ref 70–99)
Potassium: 5 mmol/L (ref 3.5–5.1)
Sodium: 138 mmol/L (ref 135–145)

## 2014-08-19 NOTE — H&P (Signed)
PREOPERATIVE H&P  Chief Complaint: trouble swallowing  HPI: Patrick Butler is a 54 y.o. male who presents for evaluation of trouble swallowing. He is 10 yrs status post chemo-radiation therapy for treatment of tonsil cancer. On recent Ba swallow test he has a short narrow cervical esophageal narrowing. He's taken to the OR for dilation and cervical esophagoscopy.  Past Medical History  Diagnosis Date  . Cancer 2005    tonsils/Chemo and radiation  . GERD (gastroesophageal reflux disease)   . Hyperlipidemia   . Hypothyroidism   . Hypertension     states under control with med., has been on med. ~ 5 yr.  . Non-insulin dependent type 2 diabetes mellitus   . Sleep apnea     uses CPAP nightly  . Dysphagia 08/2014  . History of cancer tonsil   . Anxiety   . Arthritis     right knee  . Full dentures   . Seasonal allergies   . History of chemotherapy   . History of radiation therapy    Past Surgical History  Procedure Laterality Date  . Laparoscopic gastric bypass  2004  . Knee arthroscopy Right     x 3  . Replacement total knee Right 03/22/2004  . Shoulder arthroscopy Left   . Shoulder open rotator cuff repair Right   . Lymphadenectomy Right     cervical  . Port-a-cath removal    . Portacath placement    . Multiple tooth extractions    . Lumbar laminectomy/decompression microdiscectomy  05/24/2011    Procedure: LUMBAR LAMINECTOMY/DECOMPRESSION MICRODISCECTOMY;  Surgeon: Winfield Cunas, MD;  Location: [place] [facility];  Service: Neurosurgery;  Laterality: Right;  RIGHT L5-S1 Laminectomy, discectomy  . Colonoscopy  2012   History   Social History  . Marital Status: Married    Spouse Name: [name]  . Number of Children: N/A  . Years of Education: N/A   Occupational History  . [employer]    Social History Main Topics  . Smoking status: Former Smoker -- 0.00 packs/day for 25 years    Quit date: 10/25/2002  . Smokeless tobacco: Never Used  . Alcohol Use: Yes     Comment: 1  drink daily  . Drug Use: No  . Sexual Activity: Yes   Other Topics Concern  . None   Social History Narrative   Family History  Problem Relation Age of Onset  . Colon cancer Paternal Uncle   . Heart attack Maternal Uncle      X 2; both pre 55  . Hypertension Mother   . Breast cancer Mother   . Brain cancer Paternal Uncle   . Diabetes Maternal Grandfather   . Hypertension Maternal Grandfather     ? metastatic; ? primary  . Diabetes Father   . Stroke Father 54  . Coronary artery disease Mother    Allergies  Allergen Reactions  . Sulfa Antibiotics Rash   Prior to Admission medications   Medication Sig Start Date End Date Taking? Authorizing Provider  amLODipine-benazepril (LOTREL) 5-40 MG per capsule TAKE 1 CAPSULE BY MOUTH ONCE DAILY 02/10/14  Yes Hendricks Limes, MD  aspirin 81 MG tablet Take 81 mg by mouth daily.   Yes Historical Provider, MD  calcium carbonate (OS-CAL) 600 MG TABS Take 600 mg by mouth daily.     Yes Historical Provider, MD  Cyanocobalamin (B-12 DOTS) 500 MCG SUBL Place 1 tablet under the tongue daily.     Yes Historical Provider, MD  ferrous  gluconate (FERGON) 325 MG tablet Take 325 mg by mouth daily before breakfast.     Yes Historical Provider, MD  fluticasone (FLONASE) 50 MCG/ACT nasal spray Place 2 sprays into the nose as directed. At bedtime 11/20/11  Yes Hendricks Limes, MD  GLUCOSAMINE CHONDROITIN COMPLX PO Take 15 mLs by mouth daily.     Yes Historical Provider, MD  levothyroxine (SYNTHROID, LEVOTHROID) 175 MCG tablet TAKE 1 TABLET BY MOUTH DAILY 06/11/14  Yes Hendricks Limes, MD  LORazepam (ATIVAN) 0.5 MG tablet TAKE 1 TABLET BY MOUTH THREE TIMES DAILYAS NEEDED ONLY 04/15/14  Yes Hendricks Limes, MD  metFORMIN (GLUCOPHAGE) 500 MG tablet TAKE ONE TABLET TWICE DAILY WITH MEALS 11/19/13  Yes Hendricks Limes, MD  Multiple Vitamins-Minerals (MULTIVITAMIN WITH MINERALS) tablet Take 1 tablet by mouth daily. Mega mans [employer] vit pack   Yes Historical Provider,  MD  pantoprazole (PROTONIX) 40 MG tablet TAKE 1 TABLET BY MOUTH DAILY FOR REFLUX 06/09/14  Yes Hendricks Limes, MD  pravastatin (PRAVACHOL) 20 MG tablet TAKE 1 TABLET BY MOUTH EVERY NIGHT AT BEDTIME 11/02/13  Yes Hendricks Limes, MD     Positive ROS: trouble swallowing  All other systems have been reviewed and were otherwise negative with the exception of those mentioned in the HPI and as above.  Physical Exam: There were no vitals filed for this visit.  General: Alert, no acute distress Oral: Normal oral mucosa and tonsils Nasal: Clear nasal passages Neck: No palpable adenopathy or thyroid nodules Ear: Ear canal is clear with normal appearing TMs Cardiovascular: Regular rate and rhythm, no murmur.  Respiratory: Clear to auscultation Neurologic: Alert and oriented x 3   Assessment/Plan: DYSPHAGIA Plan for Procedure(s): DIRECT LARYNGOSCOPY ESOPHAGOSCOPY WITH DILATATION   Melony Overly, MD 08/19/2014 3:34 PM

## 2014-08-19 NOTE — Pre-Procedure Instructions (Signed)
Pt. seen by Dr. Tobias Alexander for airway evaluation; pt. OK to come for surgery.

## 2014-08-20 ENCOUNTER — Ambulatory Visit (HOSPITAL_BASED_OUTPATIENT_CLINIC_OR_DEPARTMENT_OTHER)
Admission: RE | Admit: 2014-08-20 | Discharge: 2014-08-20 | Disposition: A | Discharge status: Home / Self Care | Payer: BLUE CROSS/BLUE SHIELD | Source: Ambulatory Visit | Attending: Otolaryngology | Admitting: Otolaryngology

## 2014-08-20 ENCOUNTER — Ambulatory Visit (HOSPITAL_BASED_OUTPATIENT_CLINIC_OR_DEPARTMENT_OTHER): Payer: BLUE CROSS/BLUE SHIELD | Admitting: Anesthesiology

## 2014-08-20 ENCOUNTER — Encounter (HOSPITAL_BASED_OUTPATIENT_CLINIC_OR_DEPARTMENT_OTHER): Payer: Self-pay | Admitting: *Deleted

## 2014-08-20 ENCOUNTER — Encounter (HOSPITAL_BASED_OUTPATIENT_CLINIC_OR_DEPARTMENT_OTHER)
Admission: RE | Disposition: A | Discharge status: Home / Self Care | Payer: Self-pay | Source: Ambulatory Visit | Attending: Otolaryngology

## 2014-08-20 DIAGNOSIS — K222 Esophageal obstruction: Secondary | ICD-10-CM | POA: Insufficient documentation

## 2014-08-20 DIAGNOSIS — M1990 Unspecified osteoarthritis, unspecified site: Secondary | ICD-10-CM | POA: Insufficient documentation

## 2014-08-20 DIAGNOSIS — Z8521 Personal history of malignant neoplasm of larynx: Secondary | ICD-10-CM | POA: Insufficient documentation

## 2014-08-20 DIAGNOSIS — G4730 Sleep apnea, unspecified: Secondary | ICD-10-CM | POA: Insufficient documentation

## 2014-08-20 DIAGNOSIS — Z96651 Presence of right artificial knee joint: Secondary | ICD-10-CM | POA: Insufficient documentation

## 2014-08-20 DIAGNOSIS — Z87891 Personal history of nicotine dependence: Secondary | ICD-10-CM | POA: Insufficient documentation

## 2014-08-20 DIAGNOSIS — F419 Anxiety disorder, unspecified: Secondary | ICD-10-CM | POA: Insufficient documentation

## 2014-08-20 DIAGNOSIS — Z9884 Bariatric surgery status: Secondary | ICD-10-CM | POA: Insufficient documentation

## 2014-08-20 DIAGNOSIS — Z923 Personal history of irradiation: Secondary | ICD-10-CM | POA: Insufficient documentation

## 2014-08-20 DIAGNOSIS — Z880 Allergy status to penicillin: Secondary | ICD-10-CM | POA: Insufficient documentation

## 2014-08-20 DIAGNOSIS — E785 Hyperlipidemia, unspecified: Secondary | ICD-10-CM | POA: Insufficient documentation

## 2014-08-20 DIAGNOSIS — E039 Hypothyroidism, unspecified: Secondary | ICD-10-CM | POA: Insufficient documentation

## 2014-08-20 DIAGNOSIS — K219 Gastro-esophageal reflux disease without esophagitis: Secondary | ICD-10-CM | POA: Insufficient documentation

## 2014-08-20 DIAGNOSIS — I10 Essential (primary) hypertension: Secondary | ICD-10-CM | POA: Insufficient documentation

## 2014-08-20 DIAGNOSIS — Z7982 Long term (current) use of aspirin: Secondary | ICD-10-CM | POA: Insufficient documentation

## 2014-08-20 HISTORY — DX: Dysphagia, unspecified: R13.10

## 2014-08-20 HISTORY — DX: Type 2 diabetes mellitus without complications: E11.9

## 2014-08-20 HISTORY — PX: ESOPHAGOSCOPY WITH DILITATION: SHX5618

## 2014-08-20 HISTORY — PX: DIRECT LARYNGOSCOPY: SHX5326

## 2014-08-20 HISTORY — DX: Personal history of irradiation: Z92.3

## 2014-08-20 HISTORY — DX: Complete loss of teeth, unspecified cause, unspecified class: K08.109

## 2014-08-20 HISTORY — DX: Other seasonal allergic rhinitis: J30.2

## 2014-08-20 HISTORY — DX: Personal history of antineoplastic chemotherapy: Z92.21

## 2014-08-20 HISTORY — DX: Complete loss of teeth, unspecified cause, unspecified class: Z97.2

## 2014-08-20 LAB — GLUCOSE, CAPILLARY
GLUCOSE-CAPILLARY: 139 mg/dL — AB (ref 70–99)
Glucose-Capillary: 135 mg/dL — ABNORMAL HIGH (ref 70–99)

## 2014-08-20 LAB — POCT HEMOGLOBIN-HEMACUE: HEMOGLOBIN: 15.7 g/dL (ref 13.0–17.0)

## 2014-08-20 SURGERY — LARYNGOSCOPY, DIRECT
Anesthesia: General | Site: Throat

## 2014-08-20 MED ORDER — AMOXICILLIN 250 MG/5ML PO SUSR: 500.0000 mg | Freq: Three times a day (TID) | ORAL | Status: AC

## 2014-08-20 MED ORDER — EPINEPHRINE HCL 1 MG/ML IJ SOLN
INTRAMUSCULAR | Status: AC
  Filled 2014-08-20: qty 1

## 2014-08-20 MED ORDER — OXYCODONE HCL 5 MG PO TABS: 5.0000 mg | ORAL_TABLET | Freq: Once | ORAL | Status: DC | PRN

## 2014-08-20 MED ORDER — MEPERIDINE HCL 25 MG/ML IJ SOLN: 6.2500 mg | INTRAMUSCULAR | Status: DC | PRN

## 2014-08-20 MED ORDER — ONDANSETRON HCL 4 MG/2ML IJ SOLN
INTRAMUSCULAR | Status: DC | PRN
  Administered 2014-08-20: 4 mg via INTRAVENOUS

## 2014-08-20 MED ORDER — MIDAZOLAM HCL 2 MG/ML PO SYRP: 12.0000 mg | ORAL_SOLUTION | Freq: Once | ORAL | Status: DC | PRN

## 2014-08-20 MED ORDER — MIDAZOLAM HCL 2 MG/2ML IJ SOLN
INTRAMUSCULAR | Status: AC
  Filled 2014-08-20: qty 2

## 2014-08-20 MED ORDER — MIDAZOLAM HCL 2 MG/2ML IJ SOLN
1.0000 mg | INTRAMUSCULAR | Status: DC | PRN
  Administered 2014-08-20: 2 mg via INTRAVENOUS

## 2014-08-20 MED ORDER — FENTANYL CITRATE 0.05 MG/ML IJ SOLN
50.0000 ug | INTRAMUSCULAR | Status: DC | PRN
  Administered 2014-08-20: 100 ug via INTRAVENOUS

## 2014-08-20 MED ORDER — LIDOCAINE-EPINEPHRINE 1 %-1:100000 IJ SOLN
INTRAMUSCULAR | Status: AC
  Filled 2014-08-20: qty 1

## 2014-08-20 MED ORDER — OXYMETAZOLINE HCL 0.05 % NA SOLN
NASAL | Status: AC
  Filled 2014-08-20: qty 15

## 2014-08-20 MED ORDER — PROPOFOL 10 MG/ML IV BOLUS
INTRAVENOUS | Status: DC | PRN
  Administered 2014-08-20: 200 mg via INTRAVENOUS

## 2014-08-20 MED ORDER — CEFAZOLIN SODIUM 1-5 GM-% IV SOLN
INTRAVENOUS | Status: AC
  Filled 2014-08-20: qty 50

## 2014-08-20 MED ORDER — DEXAMETHASONE SODIUM PHOSPHATE 4 MG/ML IJ SOLN
INTRAMUSCULAR | Status: DC | PRN
  Administered 2014-08-20: 10 mg via INTRAVENOUS

## 2014-08-20 MED ORDER — OXYCODONE HCL 5 MG/5ML PO SOLN: 5.0000 mg | Freq: Once | ORAL | Status: DC | PRN

## 2014-08-20 MED ORDER — SUCCINYLCHOLINE CHLORIDE 20 MG/ML IJ SOLN
INTRAMUSCULAR | Status: DC | PRN
  Administered 2014-08-20: 140 mg via INTRAVENOUS

## 2014-08-20 MED ORDER — DEXTROSE 5 % IV SOLN
3.0000 g | INTRAVENOUS | Status: AC
  Administered 2014-08-20: 3 g via INTRAVENOUS

## 2014-08-20 MED ORDER — LIDOCAINE HCL (CARDIAC) 20 MG/ML IV SOLN
INTRAVENOUS | Status: DC | PRN
  Administered 2014-08-20: 100 mg via INTRAVENOUS

## 2014-08-20 MED ORDER — HYDROMORPHONE HCL 1 MG/ML IJ SOLN: 0.2500 mg | INTRAMUSCULAR | Status: DC | PRN

## 2014-08-20 MED ORDER — FENTANYL CITRATE (PF) 100 MCG/2ML IJ SOLN
INTRAMUSCULAR | Status: AC
  Filled 2014-08-20: qty 6

## 2014-08-20 MED ORDER — EPHEDRINE SULFATE 50 MG/ML IJ SOLN
INTRAMUSCULAR | Status: DC | PRN
  Administered 2014-08-20: 10 mg via INTRAVENOUS

## 2014-08-20 MED ORDER — LACTATED RINGERS IV SOLN
INTRAVENOUS | Status: DC
  Administered 2014-08-20 (×2): via INTRAVENOUS

## 2014-08-20 MED ORDER — PROMETHAZINE HCL 25 MG/ML IJ SOLN: 6.2500 mg | INTRAMUSCULAR | Status: DC | PRN

## 2014-08-20 MED ORDER — CEFAZOLIN SODIUM-DEXTROSE 2-3 GM-% IV SOLR
INTRAVENOUS | Status: AC
  Filled 2014-08-20: qty 50

## 2014-08-20 SURGICAL SUPPLY — 29 items
CANISTER SUCT 1200ML W/VALVE (MISCELLANEOUS) ×2 IMPLANT
CONT SPEC 4OZ CLIKSEAL STRL BL (MISCELLANEOUS) IMPLANT
GLOVE BIOGEL PI IND STRL 7.0 (GLOVE) IMPLANT
GLOVE BIOGEL PI INDICATOR 7.0 (GLOVE) ×1
GLOVE ECLIPSE 6.5 STRL STRAW (GLOVE) ×1 IMPLANT
GLOVE SS BIOGEL STRL SZ 7.5 (GLOVE) ×1 IMPLANT
GLOVE SUPERSENSE BIOGEL SZ 7.5 (GLOVE) ×1
GOWN STRL REUS W/ TWL LRG LVL3 (GOWN DISPOSABLE) IMPLANT
GOWN STRL REUS W/ TWL XL LVL3 (GOWN DISPOSABLE) IMPLANT
GOWN STRL REUS W/TWL LRG LVL3 (GOWN DISPOSABLE) ×2
GOWN STRL REUS W/TWL XL LVL3 (GOWN DISPOSABLE)
GUARD TEETH (MISCELLANEOUS) ×1 IMPLANT
MARKER SKIN DUAL TIP RULER LAB (MISCELLANEOUS) IMPLANT
NDL SAFETY ECLIPSE 18X1.5 (NEEDLE) ×1 IMPLANT
NDL SPNL 22GX7 QUINCKE BK (NEEDLE) IMPLANT
NEEDLE HYPO 18GX1.5 SHARP (NEEDLE)
NEEDLE SPNL 22GX7 QUINCKE BK (NEEDLE) IMPLANT
NS IRRIG 1000ML POUR BTL (IV SOLUTION) ×2 IMPLANT
PATTIES SURGICAL .5 X3 (DISPOSABLE) ×1 IMPLANT
SHEET MEDIUM DRAPE 40X70 STRL (DRAPES) ×2 IMPLANT
SLEEVE SCD COMPRESS KNEE MED (MISCELLANEOUS) ×1 IMPLANT
SOLUTION ANTI FOG 6CC (MISCELLANEOUS) IMPLANT
SOLUTION BUTLER CLEAR DIP (MISCELLANEOUS) ×1 IMPLANT
SPONGE GAUZE 4X4 12PLY STER LF (GAUZE/BANDAGES/DRESSINGS) ×4 IMPLANT
SURGILUBE 2OZ TUBE FLIPTOP (MISCELLANEOUS) ×2 IMPLANT
SYR 5ML LL (SYRINGE) ×1 IMPLANT
SYR CONTROL 10ML LL (SYRINGE) IMPLANT
TOWEL OR 17X24 6PK STRL BLUE (TOWEL DISPOSABLE) ×2 IMPLANT
TUBE CONNECTING 20X1/4 (TUBING) ×2 IMPLANT

## 2014-08-20 NOTE — Op Note (Signed)
NAME:  Patrick Butler, Patrick Butler NO.:  [id number]  MEDICAL RECORD NO.:  [id number]  LOCATION:                                 FACILITY:  PHYSICIAN:  Leonides Sake. Lucia Gaskins, M.D. DATE OF BIRTH:  DATE OF PROCEDURE:  08/20/2014 DATE OF DISCHARGE:                              OPERATIVE REPORT   PREOPERATIVE DIAGNOSIS:  Dysphagia with upper cervical esophageal stricture.  POSTOPERATIVE DIAGNOSIS:  Dysphagia with upper cervical esophageal stricture.  OPERATION PERFORMED:  Direct laryngoscopy.  Esophagoscopy with dilation of cervical esophageal stricture with Savary dilators to 18 mm.  SURGEON:  Leonides Sake. Lucia Gaskins, M.D.  ANESTHESIA:  General endotracheal.  COMPLICATIONS:  None.  BRIEF CLINICAL NOTE:  The patient is a 54 year old gentleman who is 10 years status post chemo radiation treatment of a tonsil cancer.  More recently, he has had a trouble swallowing with food getting stuck in the upper esophageal area.  He underwent a barium swallow which showed a stricture in the upper cervical esophagus.  He is taken to operating room at this time for direct laryngoscopy with dilation of the upper cervical esophageal stricture over the Savary dilators.  DESCRIPTION OF PROCEDURE:  After adequate endotracheal anesthesia, first direct laryngoscopy was performed.  The tonsil regions, base of tongue, vallecula, epiglottis were all normal.  He has some postradiation changes but no mucosal abnormalities.  Vocal cords were clear.  AE folds were normal.  I attempted to pass the cervical esophagus.  The esophagoscope could not pass the upper esophageal sphincter.  At this point, we passed the guidewire from the Savary dilators down through the upper esophageal sphincter without difficulty and passed it down to about 26 cm from his upper lips.  Starting with the 10 mm Savary dilator, this was passed over the guidewire without too much difficulty. He was subsequently dilated up to a  #18 mm Savary dilator.  Following dilation of the 18 mm Savary dilator, the wire and dilator were removed and cervical esophagoscopy was performed.  At this time, the cervical esophagoscope passed easily through the upper esophageal sphincter and through the stricture area.  This completed the procedure.  The patient was subsequently awakened from anesthesia and transferred to recovery room, postop doing well.  DISPOSITION:  The patient was discharged home later this morning.  I placed him on amoxicillin suspension 5 mg b.i.d. for 1 week.  We will start with a liquid diet and advance diet as tolerated.  I will have him follow up in my office in 1 week for recheck.          ______________________________ Leonides Sake. Lucia Gaskins, M.D.     CEN/MEDQ  D:  08/20/2014  T:  08/20/2014  Job:  [id number]

## 2014-08-20 NOTE — Brief Op Note (Signed)
08/20/2014  8:23 AM  PATIENT:  Patrick Butler  54 y.o. male  PRE-OPERATIVE DIAGNOSIS:  DYSPHAGIA  POST-OPERATIVE DIAGNOSIS:  Dysphagia with cervical esophageal stricture  PROCEDURE:  Procedure(s): DIRECT LARYNGOSCOPY (N/A) ESOPHAGOSCOPY WITH DILATATION (N/A)  SURGEON:  Surgeon(s) and Role:    * Rozetta Nunnery, MD - Primary  PHYSICIAN ASSISTANT:   ASSISTANTS: none   ANESTHESIA:   general  EBL:  Total I/O In: 1000 [I.V.:1000] Out: -   BLOOD ADMINISTERED:none  DRAINS: none   LOCAL MEDICATIONS USED:  NONE  SPECIMEN:  No Specimen  DISPOSITION OF SPECIMEN:  N/A  COUNTS:  YES  TOURNIQUET:  * No tourniquets in log *  DICTATION: .Other Dictation: Dictation Number [id number]  PLAN OF CARE: Discharge to home after PACU  PATIENT DISPOSITION:  PACU - hemodynamically stable.   Delay start of Pharmacological VTE agent (>24hrs) due to surgical blood loss or risk of bleeding: not applicable

## 2014-08-20 NOTE — Transfer of Care (Signed)
Immediate Anesthesia Transfer of Care Note  Patient: Patrick Butler  Procedure(s) Performed: Procedure(s): DIRECT LARYNGOSCOPY (N/A) ESOPHAGOSCOPY WITH DILATATION (N/A)  Patient Location: PACU  Anesthesia Type:General  Level of Consciousness: awake, alert  and oriented  Airway & Oxygen Therapy: Patient Spontanous Breathing and Patient connected to face mask oxygen  Post-op Assessment: Report given to RN and Post -op Vital signs reviewed and stable  Post vital signs: Reviewed and stable  Last Vitals:  Filed Vitals:   08/20/14 0922  BP: 136/80  Pulse: 93  Temp: 36.7 C  Resp: 16    Complications: No apparent anesthesia complications

## 2014-08-20 NOTE — Anesthesia Preprocedure Evaluation (Signed)
Anesthesia Evaluation  Patient identified by MRN, date of birth, ID band Patient awake    Reviewed: Allergy & Precautions, H&P , NPO status , Patient's Chart, lab work & pertinent test results  Airway Mallampati: II  TM Distance: >3 FB Neck ROM: Full    Dental  (+) Edentulous Upper, Edentulous Lower   Pulmonary sleep apnea (hasn't used CPAP in years) , former smoker,  H/o tonsillar cancer-chemo and XRT breath sounds clear to auscultation  Pulmonary exam normal       Cardiovascular hypertension, Pt. on medications Rhythm:Regular Rate:Normal     Neuro/Psych Depression Chronic back pain    GI/Hepatic Neg liver ROS, GERD-  Medicated and Controlled,S/p gastric bypass, has lost 100lbs   Endo/Other  diabetesHypothyroidism (on treatment) Morbid obesity  Renal/GU negative Renal ROS     Musculoskeletal   Abdominal (+) + obese,   Peds  Hematology   Anesthesia Other Findings   Reproductive/Obstetrics                             Anesthesia Physical  Anesthesia Plan  ASA: III  Anesthesia Plan: General   Post-op Pain Management:    Induction: Intravenous  Airway Management Planned:   Additional Equipment:   Intra-op Plan:   Post-operative Plan: Extubation in OR  Informed Consent: I have reviewed the patients History and Physical, chart, labs and discussed the procedure including the risks, benefits and alternatives for the proposed anesthesia with the patient or authorized representative who has indicated his/her understanding and acceptance.   Dental advisory given  Plan Discussed with: CRNA  Anesthesia Plan Comments:         Anesthesia Quick Evaluation

## 2014-08-20 NOTE — Discharge Instructions (Addendum)
Tylenol or motrin prn pain OK to use throat lozenges for sore throat Take Amoxicillin 500 mg three times per day for the next 5 days Call office for follow up appt in 1 week   [phone]   Post Anesthesia Home Care Instructions  Activity: Get plenty of rest for the remainder of the day. A responsible adult should stay with you for 24 hours following the procedure.  For the next 24 hours, DO NOT: -Drive a car -Paediatric nurse -Drink alcoholic beverages -Take any medication unless instructed by your physician -Make any legal decisions or sign important papers.  Meals: Start with liquid foods such as gelatin or soup. Progress to regular foods as tolerated. Avoid greasy, spicy, heavy foods. If nausea and/or vomiting occur, drink only clear liquids until the nausea and/or vomiting subsides. Call your physician if vomiting continues.  Special Instructions/Symptoms: Your throat may feel dry or sore from the anesthesia or the breathing tube placed in your throat during surgery. If this causes discomfort, gargle with warm salt water. The discomfort should disappear within 24 hours.  If you had a scopolamine patch placed behind your ear for the management of post- operative nausea and/or vomiting:  1. The medication in the patch is effective for 72 hours, after which it should be removed.  Wrap patch in a tissue and discard in the trash. Wash hands thoroughly with soap and water. 2. You may remove the patch earlier than 72 hours if you experience unpleasant side effects which may include dry mouth, dizziness or visual disturbances. 3. Avoid touching the patch. Wash your hands with soap and water after contact with the patch.

## 2014-08-20 NOTE — Interval H&P Note (Signed)
History and Physical Interval Note:  08/20/2014 7:29 AM  Patrick Butler  has presented today for surgery, with the diagnosis of DYSPHAGIA  The various methods of treatment have been discussed with the patient and family. After consideration of risks, benefits and other options for treatment, the patient has consented to  Procedure(s): DIRECT LARYNGOSCOPY (N/A) ESOPHAGOSCOPY WITH DILATATION (N/A) as a surgical intervention .  The patient's history has been reviewed, patient examined, no change in status, stable for surgery.  I have reviewed the patient's chart and labs.  Questions were answered to the patient's satisfaction.     Jakya Dovidio

## 2014-08-20 NOTE — Anesthesia Postprocedure Evaluation (Signed)
Anesthesia Post Note  Patient: Patrick Butler  Procedure(s) Performed: Procedure(s) (LRB): DIRECT LARYNGOSCOPY (N/A) ESOPHAGOSCOPY WITH DILATATION (N/A)  Anesthesia type: general  Patient location: PACU  Post pain: Pain level controlled  Post assessment: Patient's Cardiovascular Status Stable  Last Vitals:  Filed Vitals:   08/20/14 0922  BP: 136/80  Pulse: 93  Temp: 36.7 C  Resp: 16    Post vital signs: Reviewed and stable  Level of consciousness: sedated  Complications: No apparent anesthesia complications

## 2014-08-20 NOTE — Anesthesia Procedure Notes (Signed)
Procedure Name: Intubation Date/Time: 08/20/2014 7:40 AM Performed by: Melynda Ripple D Pre-anesthesia Checklist: Patient identified, Emergency Drugs available, Suction available and Patient being monitored Patient Re-evaluated:Patient Re-evaluated prior to inductionOxygen Delivery Method: Circle System Utilized Preoxygenation: Pre-oxygenation with 100% oxygen Intubation Type: IV induction Ventilation: Mask ventilation without difficulty Laryngoscope Size: Mac and 3 Grade View: Grade II Tube type: Oral Number of attempts: 1 Airway Equipment and Method: Stylet and Oral airway Placement Confirmation: ETT inserted through vocal cords under direct vision,  positive ETCO2 and breath sounds checked- equal and bilateral Secured at: 22 cm Tube secured with: Tape Dental Injury: Teeth and Oropharynx as per pre-operative assessment

## 2014-08-24 ENCOUNTER — Encounter (HOSPITAL_BASED_OUTPATIENT_CLINIC_OR_DEPARTMENT_OTHER): Payer: Self-pay | Admitting: Otolaryngology

## 2014-10-11 ENCOUNTER — Other Ambulatory Visit: Payer: Self-pay | Admitting: Internal Medicine

## 2014-10-29 ENCOUNTER — Other Ambulatory Visit: Payer: Self-pay | Admitting: Internal Medicine

## 2014-11-01 ENCOUNTER — Other Ambulatory Visit: Payer: Self-pay | Admitting: Emergency Medicine

## 2014-11-01 ENCOUNTER — Other Ambulatory Visit: Payer: Self-pay

## 2014-11-01 MED ORDER — PRAVASTATIN SODIUM 20 MG PO TABS: 20.0000 mg | ORAL_TABLET | Freq: Every day | ORAL | Status: DC

## 2014-12-08 ENCOUNTER — Other Ambulatory Visit: Payer: Self-pay | Admitting: Internal Medicine

## 2014-12-08 ENCOUNTER — Other Ambulatory Visit: Payer: Self-pay | Admitting: Emergency Medicine

## 2014-12-08 MED ORDER — PANTOPRAZOLE SODIUM 40 MG PO TBEC: DELAYED_RELEASE_TABLET | ORAL | Status: DC

## 2014-12-17 ENCOUNTER — Other Ambulatory Visit: Payer: Self-pay | Admitting: Internal Medicine

## 2014-12-27 ENCOUNTER — Other Ambulatory Visit: Payer: Self-pay | Admitting: Internal Medicine

## 2014-12-27 NOTE — Telephone Encounter (Signed)
Levothyroxine has been resent to pharm. Pt must keep appt for further refills

## 2014-12-27 NOTE — Telephone Encounter (Signed)
Pt called regarding this prescription. This was just filled last week but lost the medication on his camping trip this weekend

## 2015-01-07 MED ORDER — LEVOTHYROXINE SODIUM 175 MCG PO TABS: 175.0000 ug | ORAL_TABLET | Freq: Every day | ORAL | Status: DC

## 2015-01-07 NOTE — Addendum Note (Signed)
Addended by: Earnstine Regal on: 01/07/2015 02:09 PM   Modules accepted: Orders

## 2015-01-21 ENCOUNTER — Other Ambulatory Visit (INDEPENDENT_AMBULATORY_CARE_PROVIDER_SITE_OTHER): Payer: BLUE CROSS/BLUE SHIELD

## 2015-01-21 ENCOUNTER — Encounter: Payer: Self-pay | Admitting: Internal Medicine

## 2015-01-21 ENCOUNTER — Ambulatory Visit (INDEPENDENT_AMBULATORY_CARE_PROVIDER_SITE_OTHER): Payer: BLUE CROSS/BLUE SHIELD | Admitting: Internal Medicine

## 2015-01-21 VITALS — BP 146/88 | HR 96 | Temp 97.9°F | Resp 16 | Ht 71.5 in | Wt 363.0 lb

## 2015-01-21 DIAGNOSIS — Z0000 Encounter for general adult medical examination without abnormal findings: Secondary | ICD-10-CM | POA: Diagnosis not present

## 2015-01-21 DIAGNOSIS — Z23 Encounter for immunization: Secondary | ICD-10-CM | POA: Diagnosis not present

## 2015-01-21 DIAGNOSIS — Z0189 Encounter for other specified special examinations: Secondary | ICD-10-CM

## 2015-01-21 DIAGNOSIS — E1141 Type 2 diabetes mellitus with diabetic mononeuropathy: Secondary | ICD-10-CM

## 2015-01-21 DIAGNOSIS — E039 Hypothyroidism, unspecified: Secondary | ICD-10-CM | POA: Diagnosis not present

## 2015-01-21 DIAGNOSIS — R7989 Other specified abnormal findings of blood chemistry: Secondary | ICD-10-CM | POA: Diagnosis not present

## 2015-01-21 DIAGNOSIS — E785 Hyperlipidemia, unspecified: Secondary | ICD-10-CM

## 2015-01-21 LAB — TSH: TSH: 3.75 u[IU]/mL (ref 0.35–4.50)

## 2015-01-21 LAB — CBC WITH DIFFERENTIAL/PLATELET
Basophils Absolute: 0 10*3/uL (ref 0.0–0.1)
Basophils Relative: 0.5 % (ref 0.0–3.0)
EOS PCT: 1.4 % (ref 0.0–5.0)
Eosinophils Absolute: 0.1 10*3/uL (ref 0.0–0.7)
HCT: 46.2 % (ref 39.0–52.0)
Hemoglobin: 15.3 g/dL (ref 13.0–17.0)
Lymphocytes Relative: 42.2 % (ref 12.0–46.0)
Lymphs Abs: 3.6 10*3/uL (ref 0.7–4.0)
MCHC: 33.2 g/dL (ref 30.0–36.0)
MCV: 89.2 fl (ref 78.0–100.0)
MONO ABS: 0.6 10*3/uL (ref 0.1–1.0)
MONOS PCT: 7.6 % (ref 3.0–12.0)
NEUTROS ABS: 4.1 10*3/uL (ref 1.4–7.7)
NEUTROS PCT: 48.3 % (ref 43.0–77.0)
Platelets: 206 10*3/uL (ref 150.0–400.0)
RBC: 5.18 Mil/uL (ref 4.22–5.81)
RDW: 14.3 % (ref 11.5–15.5)
WBC: 8.5 10*3/uL (ref 4.0–10.5)

## 2015-01-21 LAB — HEPATIC FUNCTION PANEL
ALT: 30 U/L (ref 0–53)
AST: 23 U/L (ref 0–37)
Albumin: 4.2 g/dL (ref 3.5–5.2)
Alkaline Phosphatase: 68 U/L (ref 39–117)
Bilirubin, Direct: 0.1 mg/dL (ref 0.0–0.3)
Total Bilirubin: 0.5 mg/dL (ref 0.2–1.2)
Total Protein: 7.4 g/dL (ref 6.0–8.3)

## 2015-01-21 LAB — BASIC METABOLIC PANEL
BUN: 15 mg/dL (ref 6–23)
CO2: 30 meq/L (ref 19–32)
Calcium: 9.8 mg/dL (ref 8.4–10.5)
Chloride: 102 mEq/L (ref 96–112)
Creatinine, Ser: 1.16 mg/dL (ref 0.40–1.50)
GFR: 69.16 mL/min (ref >60.00)
Glucose, Bld: 119 mg/dL — ABNORMAL HIGH (ref 70–99)
Potassium: 5.1 mEq/L (ref 3.5–5.1)
SODIUM: 140 meq/L (ref 135–145)

## 2015-01-21 LAB — LIPID PANEL
CHOLESTEROL: 167 mg/dL (ref 0–200)
HDL: 40.4 mg/dL (ref >39.00)
NonHDL: 126.66
Total CHOL/HDL Ratio: 4
Triglycerides: 202 mg/dL — ABNORMAL HIGH (ref 0.0–149.0)
VLDL: 40.4 mg/dL — AB (ref 0.0–40.0)

## 2015-01-21 LAB — PSA: PSA: 0.46 ng/mL (ref 0.10–4.00)

## 2015-01-21 LAB — MICROALBUMIN / CREATININE URINE RATIO
CREATININE, U: 58.4 mg/dL
MICROALB/CREAT RATIO: 1.2 mg/g (ref 0.0–30.0)

## 2015-01-21 LAB — HEMOGLOBIN A1C: Hgb A1c MFr Bld: 6.6 % — ABNORMAL HIGH (ref 4.6–6.5)

## 2015-01-21 LAB — LDL CHOLESTEROL, DIRECT: LDL DIRECT: 107 mg/dL

## 2015-01-21 NOTE — Progress Notes (Signed)
Pre visit review using our clinic review tool, if applicable. No additional management support is needed unless otherwise documented below in the visit note. 

## 2015-01-21 NOTE — Progress Notes (Signed)
   Subjective:    Patient ID: Patrick Butler, male    DOB: Feb 28, 1961, 54 y.o.   MRN: [id number]  HPI The patient is here for a physical to assess status of active health conditions.  PMH, FH, & Social History reviewed & updated.No change in [place] as recorded.  He has been compliant with his medications without adverse effects. He avoids excess red meats but does eat fried foods and adds salt at the table.  He has no regular exercise program but typically walks 4000-5000 steps per day as per his Fit Bit. He denies cardiopulmonary symptoms with exercise.  He checks his sugars occasionally; the fasting glucoses are 130-140. He is not checking sugars after meals. He is due for ophthalmologic follow-up.  He had his esophagus dilated this year because of dysphagia even with  with pills. Colonoscopy is up-to-date. He denies any active GI symptoms at this time.  He's had chronic numbness of the right thumb. Occasionally he has radicular pain in the right upper extremity. He's had paresthesia of the ventral surface of the right foot since he had disc surgery.  He has urgency and frequency as well as nocturia 3-4 times per night.    Review of Systems  Chest pain, palpitations, tachycardia, exertional dyspnea, paroxysmal nocturnal dyspnea, claudication or edema are absent. No unexplained weight loss, abdominal pain, significant dyspepsia, melena, rectal bleeding, or persistently small caliber stools. Dysuria, pyuria, hematuria,  or polyuria are denied. Change in hair, skin, nails denied. No bowel changes of constipation or diarrhea. No intolerance to heat or cold. He denies any nonhealing skin lesions.      Objective:   Physical Exam Pertinent or positive findings include: Head is shaven. He has a Academic librarian. He exhibits an S4 gallop. Abdomen is massive. He also has a massive panniculus. He has 1/2+ pitting edema. Reflexes are 0+ @ the knees. He has marked crepitus of the knees. Varices  present in the left scrotum. Prostate exam is suboptimal due to body habitus. There may be slight asymmetry. There was no nodularity or induration clinically.   General appearance :adequately nourished; in no distress. BMI is 49.93.   Eyes: No conjunctival inflammation or scleral icterus is present.  Oral exam:  Lips and gums are healthy appearing.There is no oropharyngeal erythema or exudate noted. Complete dentures.  Heart:  Normal rate and regular rhythm. S1 and S2 normal without murmur, click, rub or other extra sounds    Lungs:Chest clear to auscultation; no wheezes, rhonchi,rales ,or rubs present.No increased work of breathing.   Abdomen: bowel sounds normal, soft and non-tender without masses, organomegaly or hernias noted.  No guarding or rebound.   Vascular : all pulses equal ; no bruits present.  Skin:Warm & dry.  Intact without suspicious lesions or rashes ; no tenting or jaundice   Lymphatic: No lymphadenopathy is noted about the head, neck, axilla, or inguinal areas.   Neuro: Strength, tone  normal.          Assessment & Plan:  #1 comprehensive physical exam; no acute findings  Plan: see Orders  & Recommendations

## 2015-01-21 NOTE — Patient Instructions (Addendum)
Minimal Blood Pressure Goal= AVERAGE < 140/90;  Ideal is an AVERAGE < 135/85. This AVERAGE should be calculated from @ least 5-7 BP readings taken @ different times of day on different days of week. You should not respond to isolated BP readings , but rather the AVERAGE for that week .Please bring your  blood pressure cuff to office visits to verify that it is reliable.It  can also be checked against the blood pressure device at the pharmacy. Finger or wrist cuffs are not dependable; an arm cuff is.   Your next office appointment will be determined based upon review of your pending labs  and  xrays  Those written interpretation of the lab results and instructions will be transmitted to you by My Chart   Critical results will be called.   Followup as needed for any active or acute issue. Please report any significant change in your symptoms. 

## 2015-01-24 ENCOUNTER — Other Ambulatory Visit: Payer: Self-pay | Admitting: Internal Medicine

## 2015-02-24 ENCOUNTER — Other Ambulatory Visit: Payer: Self-pay | Admitting: Internal Medicine

## 2015-02-25 ENCOUNTER — Other Ambulatory Visit: Payer: Self-pay | Admitting: Emergency Medicine

## 2015-02-25 MED ORDER — LEVOTHYROXINE SODIUM 175 MCG PO TABS: 175.0000 ug | ORAL_TABLET | Freq: Every day | ORAL | Status: DC

## 2015-03-11 ENCOUNTER — Other Ambulatory Visit: Payer: Self-pay | Admitting: Orthopedic Surgery

## 2015-03-11 DIAGNOSIS — M25562 Pain in left knee: Secondary | ICD-10-CM

## 2015-03-26 ENCOUNTER — Ambulatory Visit
Admission: RE | Admit: 2015-03-26 | Discharge: 2015-03-26 | Disposition: A | Discharge status: Home / Self Care | Payer: BLUE CROSS/BLUE SHIELD | Source: Ambulatory Visit | Attending: Orthopedic Surgery | Admitting: Orthopedic Surgery

## 2015-03-26 DIAGNOSIS — M25562 Pain in left knee: Secondary | ICD-10-CM

## 2015-05-03 ENCOUNTER — Telehealth: Payer: Self-pay | Admitting: Pulmonary Disease

## 2015-05-03 NOTE — Telephone Encounter (Signed)
Called spoke with pt. He is going to bring his CPAP chip in this morning to get a download off his machine. Will await pt to come by.

## 2015-05-03 NOTE — Telephone Encounter (Signed)
Patient in lobby to speak to nurse and get download

## 2015-05-03 NOTE — Telephone Encounter (Signed)
Pt came in and download done. I have faxed this over to [facility]. Pt was given a copy as well. Nothing further needed

## 2015-05-19 ENCOUNTER — Encounter: Payer: Self-pay | Admitting: Internal Medicine

## 2015-05-26 ENCOUNTER — Encounter: Payer: Self-pay | Admitting: Pulmonary Disease

## 2015-05-26 ENCOUNTER — Ambulatory Visit (INDEPENDENT_AMBULATORY_CARE_PROVIDER_SITE_OTHER): Payer: BLUE CROSS/BLUE SHIELD | Admitting: Pulmonary Disease

## 2015-05-26 VITALS — BP 146/80 | HR 98 | Ht 71.5 in | Wt 381.4 lb

## 2015-05-26 DIAGNOSIS — I10 Essential (primary) hypertension: Secondary | ICD-10-CM

## 2015-05-26 DIAGNOSIS — G4733 Obstructive sleep apnea (adult) (pediatric): Secondary | ICD-10-CM

## 2015-05-26 NOTE — Assessment & Plan Note (Signed)
CPAP supplies will be renewed x 1 year May be eligible for a new CPAP next year  Weight loss encouraged, compliance with goal of at least 4-6 hrs every night is the expectation. Advised against medications with sedative side effects Cautioned against driving when sleepy - understanding that sleepiness will vary on a day to day basis

## 2015-05-26 NOTE — Patient Instructions (Signed)
CPAP supplies will be renewed x 1 year 

## 2015-05-26 NOTE — Assessment & Plan Note (Signed)
F/U with PCP

## 2015-05-26 NOTE — Progress Notes (Signed)
   Subjective:    Patient ID: Patrick Butler, male    DOB: Feb 06, 1961, 54 y.o.   MRN: [id number]  HPI  Chief Complaint  Patient presents with  . Follow-up    Former [place] pt. Pt states that he uses CPAP nightly about 8 hours. Pt states that he does feel more rested and is not sleepy in the daytime. Pt also reports redness/blood in Right eye since this past Saturday. Pt denies pain/visual disturbance.   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, English as a foreign language with mod OSA He is on CPPA 16 cm, nasal  COmpliant, mask ok, occ leak, pr Ok - has gotten sed to this He had his dOT physical in 04/2015 & cleared x 1 yr - download was reviewed - no residuals, good usage CPAP noisy - uses ear plugs  Wt unchanged Bp slight high today   NPSG 08/2011:  AHI 21/hr, nasal cpap titrated to 16cm   Review of Systems neg for any significant sore throat, dysphagia, itching, sneezing, nasal congestion or excess/ purulent secretions, fever, chills, sweats, unintended wt loss, pleuritic or exertional cp, hempoptysis, orthopnea pnd or change in chronic leg swelling. Also denies presyncope, palpitations, heartburn, abdominal pain, nausea, vomiting, diarrhea or change in bowel or urinary habits, dysuria,hematuria, rash, arthralgias, visual complaints, headache, numbness weakness or ataxia.     Objective:   Physical Exam  Gen. Pleasant, obese, in no distress ENT - no lesions, no post nasal drip Neck: No JVD, no thyromegaly, no carotid bruits Lungs: no use of accessory muscles, no dullness to percussion, decreased without rales or rhonchi  Cardiovascular: Rhythm regular, heart sounds  normal, no murmurs or gallops, no peripheral edema Musculoskeletal: No deformities, no cyanosis or clubbing , no tremors        Assessment & Plan:

## 2015-05-30 ENCOUNTER — Other Ambulatory Visit: Payer: Self-pay | Admitting: Internal Medicine

## 2015-06-02 ENCOUNTER — Encounter: Payer: Self-pay | Admitting: Pulmonary Disease

## 2015-06-02 ENCOUNTER — Ambulatory Visit: Payer: BLUE CROSS/BLUE SHIELD | Admitting: Pulmonary Disease

## 2015-06-22 ENCOUNTER — Other Ambulatory Visit: Payer: Self-pay | Admitting: Orthopedic Surgery

## 2015-06-22 DIAGNOSIS — M5030 Other cervical disc degeneration, unspecified cervical region: Secondary | ICD-10-CM

## 2015-07-01 ENCOUNTER — Ambulatory Visit
Admission: RE | Admit: 2015-07-01 | Discharge: 2015-07-01 | Disposition: A | Discharge status: Home / Self Care | Payer: BLUE CROSS/BLUE SHIELD | Source: Ambulatory Visit | Attending: Orthopedic Surgery | Admitting: Orthopedic Surgery

## 2015-07-01 DIAGNOSIS — M5030 Other cervical disc degeneration, unspecified cervical region: Secondary | ICD-10-CM

## 2015-07-06 ENCOUNTER — Encounter: Payer: Self-pay | Admitting: Gastroenterology

## 2015-07-13 ENCOUNTER — Other Ambulatory Visit (INDEPENDENT_AMBULATORY_CARE_PROVIDER_SITE_OTHER): Payer: BLUE CROSS/BLUE SHIELD

## 2015-07-13 ENCOUNTER — Ambulatory Visit (INDEPENDENT_AMBULATORY_CARE_PROVIDER_SITE_OTHER): Payer: BLUE CROSS/BLUE SHIELD | Admitting: Internal Medicine

## 2015-07-13 ENCOUNTER — Telehealth: Payer: Self-pay | Admitting: Emergency Medicine

## 2015-07-13 ENCOUNTER — Encounter: Payer: Self-pay | Admitting: Internal Medicine

## 2015-07-13 ENCOUNTER — Other Ambulatory Visit: Payer: BLUE CROSS/BLUE SHIELD

## 2015-07-13 ENCOUNTER — Encounter: Payer: Self-pay | Admitting: Gastroenterology

## 2015-07-13 VITALS — BP 154/104 | HR 96 | Temp 97.9°F | Resp 18 | Wt 379.0 lb

## 2015-07-13 DIAGNOSIS — Z139 Encounter for screening, unspecified: Secondary | ICD-10-CM

## 2015-07-13 DIAGNOSIS — E1141 Type 2 diabetes mellitus with diabetic mononeuropathy: Secondary | ICD-10-CM

## 2015-07-13 DIAGNOSIS — Z1211 Encounter for screening for malignant neoplasm of colon: Secondary | ICD-10-CM

## 2015-07-13 DIAGNOSIS — E785 Hyperlipidemia, unspecified: Secondary | ICD-10-CM

## 2015-07-13 DIAGNOSIS — N529 Male erectile dysfunction, unspecified: Secondary | ICD-10-CM

## 2015-07-13 DIAGNOSIS — I10 Essential (primary) hypertension: Secondary | ICD-10-CM | POA: Diagnosis not present

## 2015-07-13 DIAGNOSIS — Z85818 Personal history of malignant neoplasm of other sites of lip, oral cavity, and pharynx: Secondary | ICD-10-CM | POA: Insufficient documentation

## 2015-07-13 DIAGNOSIS — K219 Gastro-esophageal reflux disease without esophagitis: Secondary | ICD-10-CM | POA: Diagnosis not present

## 2015-07-13 DIAGNOSIS — E039 Hypothyroidism, unspecified: Secondary | ICD-10-CM

## 2015-07-13 LAB — COMPREHENSIVE METABOLIC PANEL
ALK PHOS: 62 U/L (ref 39–117)
ALT: 33 U/L (ref 0–53)
AST: 22 U/L (ref 0–37)
Albumin: 4.3 g/dL (ref 3.5–5.2)
BILIRUBIN TOTAL: 0.4 mg/dL (ref 0.2–1.2)
BUN: 19 mg/dL (ref 6–23)
CALCIUM: 9.7 mg/dL (ref 8.4–10.5)
CO2: 29 meq/L (ref 19–32)
Chloride: 103 mEq/L (ref 96–112)
Creatinine, Ser: 1.11 mg/dL (ref 0.40–1.50)
GFR: 72.65 mL/min (ref >60.00)
Glucose, Bld: 132 mg/dL — ABNORMAL HIGH (ref 70–99)
POTASSIUM: 4.7 meq/L (ref 3.5–5.1)
Sodium: 139 mEq/L (ref 135–145)
TOTAL PROTEIN: 7.4 g/dL (ref 6.0–8.3)

## 2015-07-13 LAB — LIPID PANEL
CHOL/HDL RATIO: 4
CHOLESTEROL: 172 mg/dL (ref 0–200)
HDL: 39 mg/dL — ABNORMAL LOW (ref >39.00)
NonHDL: 133.11
TRIGLYCERIDES: 230 mg/dL — AB (ref 0.0–149.0)
VLDL: 46 mg/dL — AB (ref 0.0–40.0)

## 2015-07-13 LAB — HEMOGLOBIN A1C: Hgb A1c MFr Bld: 6.8 % — ABNORMAL HIGH (ref 4.6–6.5)

## 2015-07-13 LAB — LDL CHOLESTEROL, DIRECT: LDL DIRECT: 105 mg/dL

## 2015-07-13 LAB — TSH: TSH: 4.58 u[IU]/mL — ABNORMAL HIGH (ref 0.35–4.50)

## 2015-07-13 MED ORDER — TADALAFIL 20 MG PO TABS: 20.0000 mg | ORAL_TABLET | Freq: Every day | ORAL | Status: DC | PRN

## 2015-07-13 MED ORDER — TADALAFIL 5 MG PO TABS: 5.0000 mg | ORAL_TABLET | Freq: Every day | ORAL | Status: DC

## 2015-07-13 MED ORDER — ATENOLOL 50 MG PO TABS: 50.0000 mg | ORAL_TABLET | Freq: Every day | ORAL | Status: DC

## 2015-07-13 MED ORDER — LORAZEPAM 0.5 MG PO TABS: 0.5000 mg | ORAL_TABLET | Freq: Every day | ORAL | Status: DC | PRN

## 2015-07-13 NOTE — Assessment & Plan Note (Signed)
Since he has BPH as well - will start cialis 5 mg daily If that does not work well we can try cialis prn or viagra

## 2015-07-13 NOTE — Progress Notes (Signed)
Pre visit review using our clinic review tool, if applicable. No additional management support is needed unless otherwise documented below in the visit note. 

## 2015-07-13 NOTE — Assessment & Plan Note (Signed)
Stressed weight loss - increasing exercise and decrease portions, health diet Deferred nutrition referral

## 2015-07-13 NOTE — Assessment & Plan Note (Signed)
Check a1c stressed weight loss - increase exercise and improve diet, decrease portions Will schedule eye exam

## 2015-07-13 NOTE — Telephone Encounter (Signed)
error 

## 2015-07-13 NOTE — Progress Notes (Signed)
Subjective:    Patient ID: Patrick Butler, male    DOB: May 19, 1960, 55 y.o.   MRN: [id number]  HPI He is here to establish with a new pcp.   He is here for follow up.   Diabetes: He is taking his medication daily as prescribed. He is compliant with a diabetic diet. He is not exercising regularly. He monitors his sugars and they have been running < 150 in morning, around 130 usually. He is not up-to-date with an ophthalmology examination.   Hyperlipidemia: He is taking his medication daily. He is compliant with a low fat/cholesterol diet. He is not exercising regularly.    Hypothyroidism:  He is taking his medication daily.  He denies any recent changes in energy or weight that are unexplained. He has gained some weight and thinks it is from stress eating.    Hypertension: He is taking his medication daily. He is compliant with a low sodium diet.  He denies chest pain, palpitations, shortness of breath and regular headaches. He has occasional edema. He is not exercising regularly.  He does monitor his blood pressure at home and it is variable - sometimes high.    GERD:  He is taking his medication daily as prescribed.  He denies any GERD symptoms and feels his GERD is well controlled.  On occasion stops the medication, but he gets stomach pain and will restart them.  He thinks he had a stomach ulcer in the past.    Anxiety:  He has occasional stress/anxiety. He takes ativan only as needed, which is not often.    Medications and allergies reviewed with patient and updated if appropriate.  Patient Active Problem List   Diagnosis Date Noted  . History of cancer tonsil 07/13/2015  . Type 2 diabetes mellitus ([place]) 01/12/2014  . Severe obesity (BMI >= 40) ([place]) 10/08/2013  . Lower urinary tract symptoms 10/08/2013  . Nonspecific abnormal electrocardiogram (ECG) (EKG) 09/22/2012  . OSA (obstructive sleep apnea) 08/08/2011  . Lumbar disc prolapse with compression radiculopathy 05/24/2011  .  GERD 01/18/2009  . HYPERPLASIA PROSTATE UNS W/O UR OBST & OTH LUTS 01/18/2009  . Hypothyroidism 03/23/2008  . Hyperlipidemia 03/23/2008  . Essential hypertension 03/23/2008    Current Outpatient Prescriptions on File Prior to Visit  Medication Sig Dispense Refill  . amLODipine-benazepril (LOTREL) 5-40 MG per capsule TAKE 1 CAPSULE BY MOUTH DAILY 90 capsule 3  . aspirin 81 MG tablet Take 81 mg by mouth daily.    . calcium carbonate (OS-CAL) 600 MG TABS Take 600 mg by mouth daily.      . cetirizine (ZYRTEC) 10 MG tablet Take 10 mg by mouth daily.    . Cyanocobalamin (B-12 DOTS) 500 MCG SUBL Place 1 tablet under the tongue daily.      . ferrous gluconate (FERGON) 325 MG tablet Take 325 mg by mouth daily before breakfast.      . fluticasone (FLONASE) 50 MCG/ACT nasal spray Place 2 sprays into the nose as directed. At bedtime 16 g 5  . GLUCOSAMINE CHONDROITIN COMPLX PO Take 15 mLs by mouth daily.      Marland Kitchen levothyroxine (SYNTHROID, LEVOTHROID) 175 MCG tablet Take 1 tablet (175 mcg total) by mouth daily. 90 tablet 1  . LORazepam (ATIVAN) 0.5 MG tablet TAKE 1 TABLET BY MOUTH THREE TIMES DAILYAS NEEDED ONLY 60 tablet 0  . metFORMIN (GLUCOPHAGE) 500 MG tablet TAKE ONE TABLET TWICE DAILY WITH MEALS 180 tablet 1  . Multiple Vitamins-Minerals (MULTIVITAMIN WITH  MINERALS) tablet Take 1 tablet by mouth daily. Mega mans [employer] vit pack    . pantoprazole (PROTONIX) 40 MG tablet TAKE 1 TABLET BY MOUTH ONCE DAILY FOR REFLUX 90 tablet 0  . pravastatin (PRAVACHOL) 20 MG tablet TAKE 1 TABLET BY MOUTH EVERY NIGHT AT BEDTIME 30 tablet 0   No current facility-administered medications on file prior to visit.    Past Medical History  Diagnosis Date  . Cancer ([place]) 2005    tonsils/Chemo and radiation  . GERD (gastroesophageal reflux disease)   . Hyperlipidemia   . Hypothyroidism   . Hypertension     states under control with med., has been on med. ~ 5 yr.  . Non-insulin dependent type 2 diabetes mellitus ([place])     . Sleep apnea     uses CPAP nightly  . Dysphagia 08/2014  . History of cancer tonsil   . Anxiety   . Arthritis     right knee  . Full dentures   . Seasonal allergies   . History of chemotherapy   . History of radiation therapy     neck    Past Surgical History  Procedure Laterality Date  . Laparoscopic gastric bypass  2004  . Knee arthroscopy Right     x 3  . Replacement total knee Right 03/22/2004  . Shoulder arthroscopy Left   . Shoulder open rotator cuff repair Right   . Lymphadenectomy Right     cervical  . Port-a-cath removal    . Portacath placement    . Multiple tooth extractions    . Lumbar laminectomy/decompression microdiscectomy  05/24/2011    Procedure: LUMBAR LAMINECTOMY/DECOMPRESSION MICRODISCECTOMY;  Surgeon: Winfield Cunas, MD;  Location: [facility];  Service: Neurosurgery;  Laterality: Right;  RIGHT L5-S1 Laminectomy, discectomy  . Colonoscopy  2012  . Direct laryngoscopy N/A 08/20/2014    Procedure: DIRECT LARYNGOSCOPY;  Surgeon: Rozetta Nunnery, MD;  Location: [facility];  Service: ENT;  Laterality: N/A;  . Esophagoscopy with dilitation N/A 08/20/2014    Procedure: ESOPHAGOSCOPY WITH DILATATION;  Surgeon: Rozetta Nunnery, MD;  Location: [facility];  Service: ENT;  Laterality: N/A;    Social History   Social History  . Marital Status: Married    Spouse Name: [name]  . Number of Children: N/A  . Years of Education: N/A   Occupational History  . P[employer]    Social History Main Topics  . Smoking status: Former Smoker -- 0.00 packs/day for 25 years    Quit date: 10/25/2002  . Smokeless tobacco: Never Used  . Alcohol Use: Yes     Comment: 4-5 drinks/ week  . Drug Use: No  . Sexual Activity: Yes   Other Topics Concern  . None   Social History Narrative    Family History  Problem Relation Age of Onset  . Colon cancer Paternal Uncle   . Heart attack Maternal Uncle      X 2; both pre 55  .  Hypertension Mother   . Breast cancer Mother   . Brain cancer Paternal Uncle   . Diabetes Maternal Grandfather   . Hypertension Maternal Grandfather     ? metastatic; ? primary  . Diabetes Father   . Stroke Father 62  . Coronary artery disease Mother     Review of Systems  Constitutional: Negative for fever.  Respiratory: Negative for cough, shortness of breath and wheezing.   Cardiovascular: Negative for chest pain and palpitations. Leg  swelling: sometimes.  Gastrointestinal: Negative for nausea and abdominal pain.       GERD controlled  Neurological: Negative for dizziness, light-headedness and headaches.       Objective:   Filed Vitals:   07/13/15 0837  BP: 154/104  Pulse: 96  Temp: 97.9 F (36.6 C)  Resp: 18   Filed Weights   07/13/15 0837  Weight: 379 lb (171.913 kg)   Body mass index is 52.13 kg/(m^2).   Physical Exam Constitutional: Appears well-developed and well-nourished. No distress.  Neck: Neck supple. No tracheal deviation present. No thyromegaly present.  No carotid bruit. No cervical adenopathy.   Cardiovascular: Normal rate, regular rhythm and normal heart sounds.   No murmur heard.  1 +pitting edema Pulmonary/Chest: Effort normal and breath sounds normal. No respiratory distress. No wheezes.        Assessment & Plan:    See Problem List for Assessment and Plan of chronic medical problems.  Pneumonia vaccine today Almost due for a colonoscopy - referred  Follow up in 6 months

## 2015-07-13 NOTE — Patient Instructions (Addendum)
   Test(s) ordered today. Your results will be released to [place] (or called to you) after review, usually within 72hours after test completion. If any changes need to be made, you will be notified at that same time.  All other Health Maintenance issues reviewed.   All recommended immunizations and age-appropriate screenings are up-to-date.  A pneumonia vaccine was administered today.   Medications reviewed and updated.  We will start atenolol for your blood pressure.  We will start cialis daily for your prostate and erectile dysfunction.  Your prescription(s) have been submitted to your pharmacy. Please take as directed and contact our office if you believe you are having problem(s) with the medication(s).  Work on Occupational hygienist activity.  Decrease your portions and work on weight loss.    Please followup in 6 months for your physical

## 2015-07-13 NOTE — Assessment & Plan Note (Signed)
Check lipid panel, cmp On pravastatin 20 mg daily

## 2015-07-13 NOTE — Assessment & Plan Note (Signed)
tsh 6 months ago was in the normal range Recheck tsh - titrate medication if needed

## 2015-07-13 NOTE — Assessment & Plan Note (Signed)
Controlled Takes protonix, but sometimes stops - has to restart due to stomach pain (? H/o ulcer in the past) Work on weight loss

## 2015-07-13 NOTE — Assessment & Plan Note (Signed)
Not controlled Continue lotrel at current dose Start atenolol 50 mg daily Work on increasing exercise Decrease portions, healthy diet Stressed weight loss Check cmp

## 2015-07-14 ENCOUNTER — Telehealth: Payer: Self-pay | Admitting: Internal Medicine

## 2015-07-14 LAB — HIV ANTIBODY (ROUTINE TESTING W REFLEX): HIV: NONREACTIVE

## 2015-07-14 LAB — HEPATITIS C ANTIBODY: HCV Ab: NEGATIVE

## 2015-07-14 MED ORDER — LEVOTHYROXINE SODIUM 175 MCG PO TABS: 175.0000 ug | ORAL_TABLET | Freq: Every day | ORAL | Status: DC

## 2015-07-14 NOTE — Telephone Encounter (Signed)
His tests for hiv and hepatitis c are neg.  Your kidney function, liver tests are normal.  Your thyroid function is slightly low -  He should continue his current dose, but twice a week take two pills.  His sugars are controlled - his a1c is 6.8%.  His cholesterol is not ideal - exercise would help, but he should also consider increasing the pravastatin to 40 mg daily - if he agrees please send in new script.

## 2015-07-15 MED ORDER — PRAVASTATIN SODIUM 40 MG PO TABS: 40.0000 mg | ORAL_TABLET | Freq: Every day | ORAL | Status: DC

## 2015-07-15 NOTE — Telephone Encounter (Signed)
LVM for pt to call back.

## 2015-07-15 NOTE — Telephone Encounter (Signed)
Spoke with pt to inform of lab results. Pt was okay with starting higher dose of pravastatin. Stated that pharmacy would not fill Cialis RX. Insurance will not cover.

## 2015-07-21 ENCOUNTER — Encounter: Payer: Self-pay | Admitting: Internal Medicine

## 2015-08-01 ENCOUNTER — Encounter: Payer: Self-pay | Admitting: Internal Medicine

## 2015-08-01 NOTE — Telephone Encounter (Signed)
Has this been started? 

## 2015-08-09 ENCOUNTER — Telehealth: Payer: Self-pay

## 2015-08-09 ENCOUNTER — Telehealth: Payer: Self-pay | Admitting: Internal Medicine

## 2015-08-09 NOTE — Telephone Encounter (Signed)
Patient is still waiting on a reply about the pharmacy not refilling his Cialis prescription.

## 2015-08-09 NOTE — Telephone Encounter (Signed)
See PA phone note 08/09/2015

## 2015-08-09 NOTE — Telephone Encounter (Signed)
Spoke with Pts pharmacy, RX is needing a PA

## 2015-08-09 NOTE — Telephone Encounter (Signed)
PA initiated via [employer]

## 2015-08-09 NOTE — Telephone Encounter (Signed)
PA DENIED - plan does not cover more than #4 tablets per 30 days

## 2015-08-10 MED ORDER — TADALAFIL 5 MG PO TABS: 5.0000 mg | ORAL_TABLET | Freq: Every day | ORAL | Status: DC

## 2015-08-10 NOTE — Telephone Encounter (Signed)
Resent for 4 pills only - advise patient he may want to see urology for other options to treat both issues.  Let me know if he wants a referral. He can use the cialis as needed for ED.

## 2015-08-10 NOTE — Telephone Encounter (Signed)
Pt advised and will consider paying for medication out of pocket

## 2015-08-15 ENCOUNTER — Telehealth: Payer: Self-pay | Admitting: *Deleted

## 2015-08-15 ENCOUNTER — Other Ambulatory Visit: Payer: Self-pay | Admitting: Internal Medicine

## 2015-08-15 DIAGNOSIS — M25562 Pain in left knee: Secondary | ICD-10-CM | POA: Diagnosis not present

## 2015-08-15 DIAGNOSIS — M1712 Unilateral primary osteoarthritis, left knee: Secondary | ICD-10-CM | POA: Diagnosis not present

## 2015-08-15 NOTE — Telephone Encounter (Signed)
Dr Loletha Carrow, This patient is scheduled for a recall colon with you on 5-2 Tuesday i the [facility]. He is a previous [facility] patient. His last OV with his PCP 07-13-2015 he had a weight of 379.0lb and his BMI is 52.12.  He has a history of DM, HTN, tonsillar cancer s/p chemo and radiatin, OSA, hx gastric bypass in 2004.  Last colon was in 2012 at [facility] with Deatra Ina and he had TA polyps and also TVA - all x 3. Do you want him to have an OV or can he be direct colon with you at [facility]? Thanks for your time and please advise.  Thanks, Lenard Galloway RN, PV

## 2015-08-16 NOTE — Telephone Encounter (Signed)
Will forward to patty to have this scheduled.  Thanks so much, Lelan Pons PV

## 2015-08-16 NOTE — Telephone Encounter (Signed)
He can be a direct book colonoscopy at [facility]

## 2015-08-17 ENCOUNTER — Other Ambulatory Visit: Payer: Self-pay

## 2015-08-17 DIAGNOSIS — Z1211 Encounter for screening for malignant neoplasm of colon: Secondary | ICD-10-CM

## 2015-08-17 DIAGNOSIS — Z860100 Personal history of colon polyps, unspecified: Secondary | ICD-10-CM

## 2015-08-17 NOTE — Telephone Encounter (Signed)
Pt has been set up for [facility] colon and [facility] colon has been cancelled

## 2015-08-22 DIAGNOSIS — M542 Cervicalgia: Secondary | ICD-10-CM | POA: Diagnosis not present

## 2015-08-22 DIAGNOSIS — M5412 Radiculopathy, cervical region: Secondary | ICD-10-CM | POA: Diagnosis not present

## 2015-08-23 ENCOUNTER — Ambulatory Visit (AMBULATORY_SURGERY_CENTER): Payer: Self-pay

## 2015-08-23 VITALS — Ht 71.5 in | Wt 377.2 lb

## 2015-08-23 DIAGNOSIS — Z860100 Personal history of colon polyps, unspecified: Secondary | ICD-10-CM

## 2015-08-23 NOTE — Progress Notes (Signed)
Per pt, no allergies to soy or egg products.Pt not taking any weight loss meds or using  O2 at home. 

## 2015-08-25 DIAGNOSIS — M5412 Radiculopathy, cervical region: Secondary | ICD-10-CM | POA: Diagnosis not present

## 2015-08-25 DIAGNOSIS — M542 Cervicalgia: Secondary | ICD-10-CM | POA: Diagnosis not present

## 2015-08-30 DIAGNOSIS — M5412 Radiculopathy, cervical region: Secondary | ICD-10-CM | POA: Diagnosis not present

## 2015-08-30 DIAGNOSIS — M542 Cervicalgia: Secondary | ICD-10-CM | POA: Diagnosis not present

## 2015-08-31 DIAGNOSIS — M5412 Radiculopathy, cervical region: Secondary | ICD-10-CM | POA: Diagnosis not present

## 2015-08-31 DIAGNOSIS — M542 Cervicalgia: Secondary | ICD-10-CM | POA: Diagnosis not present

## 2015-09-06 ENCOUNTER — Encounter: Payer: BLUE CROSS/BLUE SHIELD | Admitting: Gastroenterology

## 2015-09-06 DIAGNOSIS — M5412 Radiculopathy, cervical region: Secondary | ICD-10-CM | POA: Diagnosis not present

## 2015-09-06 DIAGNOSIS — M542 Cervicalgia: Secondary | ICD-10-CM | POA: Diagnosis not present

## 2015-09-07 DIAGNOSIS — M5412 Radiculopathy, cervical region: Secondary | ICD-10-CM | POA: Diagnosis not present

## 2015-09-07 DIAGNOSIS — M542 Cervicalgia: Secondary | ICD-10-CM | POA: Diagnosis not present

## 2015-09-13 DIAGNOSIS — M542 Cervicalgia: Secondary | ICD-10-CM | POA: Diagnosis not present

## 2015-09-13 DIAGNOSIS — M5412 Radiculopathy, cervical region: Secondary | ICD-10-CM | POA: Diagnosis not present

## 2015-09-19 ENCOUNTER — Other Ambulatory Visit: Payer: Self-pay | Admitting: Internal Medicine

## 2015-09-28 ENCOUNTER — Encounter (HOSPITAL_COMMUNITY): Payer: Self-pay | Admitting: *Deleted

## 2015-10-04 ENCOUNTER — Encounter (HOSPITAL_COMMUNITY)
Admission: RE | Disposition: A | Discharge status: Home / Self Care | Payer: Self-pay | Source: Ambulatory Visit | Attending: Gastroenterology

## 2015-10-04 ENCOUNTER — Ambulatory Visit (HOSPITAL_COMMUNITY)
Admission: RE | Admit: 2015-10-04 | Discharge: 2015-10-04 | Disposition: A | Discharge status: Home / Self Care | Payer: BLUE CROSS/BLUE SHIELD | Source: Ambulatory Visit | Attending: Gastroenterology | Admitting: Gastroenterology

## 2015-10-04 ENCOUNTER — Encounter (HOSPITAL_COMMUNITY): Payer: Self-pay | Admitting: Anesthesiology

## 2015-10-04 ENCOUNTER — Ambulatory Visit (HOSPITAL_COMMUNITY): Payer: BLUE CROSS/BLUE SHIELD | Admitting: Registered Nurse

## 2015-10-04 DIAGNOSIS — Z7984 Long term (current) use of oral hypoglycemic drugs: Secondary | ICD-10-CM | POA: Diagnosis not present

## 2015-10-04 DIAGNOSIS — E119 Type 2 diabetes mellitus without complications: Secondary | ICD-10-CM | POA: Diagnosis not present

## 2015-10-04 DIAGNOSIS — Z87891 Personal history of nicotine dependence: Secondary | ICD-10-CM | POA: Insufficient documentation

## 2015-10-04 DIAGNOSIS — Z96651 Presence of right artificial knee joint: Secondary | ICD-10-CM | POA: Diagnosis not present

## 2015-10-04 DIAGNOSIS — Z9221 Personal history of antineoplastic chemotherapy: Secondary | ICD-10-CM | POA: Diagnosis not present

## 2015-10-04 DIAGNOSIS — Z1211 Encounter for screening for malignant neoplasm of colon: Secondary | ICD-10-CM

## 2015-10-04 DIAGNOSIS — I10 Essential (primary) hypertension: Secondary | ICD-10-CM | POA: Insufficient documentation

## 2015-10-04 DIAGNOSIS — Z9989 Dependence on other enabling machines and devices: Secondary | ICD-10-CM | POA: Diagnosis not present

## 2015-10-04 DIAGNOSIS — Z6841 Body Mass Index (BMI) 40.0 and over, adult: Secondary | ICD-10-CM | POA: Insufficient documentation

## 2015-10-04 DIAGNOSIS — Z9884 Bariatric surgery status: Secondary | ICD-10-CM | POA: Insufficient documentation

## 2015-10-04 DIAGNOSIS — Z923 Personal history of irradiation: Secondary | ICD-10-CM | POA: Insufficient documentation

## 2015-10-04 DIAGNOSIS — K219 Gastro-esophageal reflux disease without esophagitis: Secondary | ICD-10-CM | POA: Insufficient documentation

## 2015-10-04 DIAGNOSIS — D123 Benign neoplasm of transverse colon: Secondary | ICD-10-CM

## 2015-10-04 DIAGNOSIS — Z860100 Personal history of colon polyps, unspecified: Secondary | ICD-10-CM

## 2015-10-04 DIAGNOSIS — G4730 Sleep apnea, unspecified: Secondary | ICD-10-CM | POA: Insufficient documentation

## 2015-10-04 DIAGNOSIS — M1711 Unilateral primary osteoarthritis, right knee: Secondary | ICD-10-CM | POA: Insufficient documentation

## 2015-10-04 HISTORY — PX: COLONOSCOPY WITH PROPOFOL: SHX5780

## 2015-10-04 LAB — GLUCOSE, CAPILLARY: GLUCOSE-CAPILLARY: 122 mg/dL — AB (ref 65–99)

## 2015-10-04 SURGERY — COLONOSCOPY WITH PROPOFOL
Anesthesia: Monitor Anesthesia Care

## 2015-10-04 MED ORDER — PROPOFOL 10 MG/ML IV BOLUS
INTRAVENOUS | Status: AC
  Filled 2015-10-04: qty 20

## 2015-10-04 MED ORDER — EPHEDRINE SULFATE 50 MG/ML IJ SOLN
INTRAMUSCULAR | Status: DC | PRN
  Administered 2015-10-04: 5 mg via INTRAVENOUS

## 2015-10-04 MED ORDER — LIDOCAINE HCL (CARDIAC) 20 MG/ML IV SOLN
INTRAVENOUS | Status: DC | PRN
  Administered 2015-10-04: 100 mg via INTRAVENOUS

## 2015-10-04 MED ORDER — PROPOFOL 10 MG/ML IV BOLUS
INTRAVENOUS | Status: AC
  Filled 2015-10-04: qty 40

## 2015-10-04 MED ORDER — LIDOCAINE HCL (CARDIAC) 20 MG/ML IV SOLN
INTRAVENOUS | Status: AC
  Filled 2015-10-04: qty 5

## 2015-10-04 MED ORDER — LACTATED RINGERS IV SOLN
INTRAVENOUS | Status: DC
  Administered 2015-10-04: 1000 mL via INTRAVENOUS

## 2015-10-04 MED ORDER — SODIUM CHLORIDE 0.9 % IV SOLN: INTRAVENOUS | Status: DC

## 2015-10-04 MED ORDER — PROPOFOL 10 MG/ML IV BOLUS
INTRAVENOUS | Status: DC | PRN
  Administered 2015-10-04 (×3): 30 mg via INTRAVENOUS

## 2015-10-04 MED ORDER — PROPOFOL 500 MG/50ML IV EMUL
INTRAVENOUS | Status: DC | PRN
  Administered 2015-10-04: 120 ug/kg/min via INTRAVENOUS

## 2015-10-04 NOTE — H&P (Signed)
  History:  This patient presents for endoscopic testing for history of colon polyps.  Patrick Butler Referring physician: Binnie Rail, MD  Past Medical History: Past Medical History  Diagnosis Date  . Cancer ([place]) 2005    tonsils/Chemo and radiation  . GERD (gastroesophageal reflux disease)   . Hyperlipidemia   . Hypothyroidism   . Hypertension     states under control with med., has been on med. ~ 5 yr.  . Non-insulin dependent type 2 diabetes mellitus ([place])   . Sleep apnea     uses CPAP nightly  . Dysphagia 08/2014  . Anxiety   . Arthritis     right knee  . Full dentures   . Seasonal allergies   . History of chemotherapy   . History of radiation therapy     neck     Past Surgical History: Past Surgical History  Procedure Laterality Date  . Laparoscopic gastric bypass  2004  . Knee arthroscopy Right     x 3  . Replacement total knee Right 03/22/2004  . Shoulder arthroscopy Left   . Shoulder open rotator cuff repair Right   . Lymphadenectomy Right     cervical  . Port-a-cath removal    . Portacath placement    . Multiple tooth extractions    . Lumbar laminectomy/decompression microdiscectomy  05/24/2011    Procedure: LUMBAR LAMINECTOMY/DECOMPRESSION MICRODISCECTOMY;  Surgeon: Winfield Cunas, MD;  Location: [facility];  Service: Neurosurgery;  Laterality: Right;  RIGHT L5-S1 Laminectomy, discectomy  . Colonoscopy  2012  . Direct laryngoscopy N/A 08/20/2014    Procedure: DIRECT LARYNGOSCOPY;  Surgeon: Rozetta Nunnery, MD;  Location: [facility];  Service: ENT;  Laterality: N/A;  . Esophagoscopy with dilitation N/A 08/20/2014    Procedure: ESOPHAGOSCOPY WITH DILATATION;  Surgeon: Rozetta Nunnery, MD;  Location: [facility];  Service: ENT;  Laterality: N/A;    Allergies: Allergies  Allergen Reactions  . Sulfa Antibiotics Rash    Outpatient Meds: Current Facility-Administered Medications  Medication Dose Route Frequency  Provider Last Rate Last Dose  . 0.9 %  sodium chloride infusion   Intravenous Continuous [name], MD      . lactated ringers infusion   Intravenous Continuous [name], MD 10 mL/hr at 10/04/15 0927 1,000 mL at 10/04/15 [id number]      ___________________________________________________________________ Objective  Exam:  BP 133/62 mmHg  Pulse 74  Temp(Src) 98.1 F (36.7 C) (Oral)  Resp 14  Ht 6' (1.829 m)  Wt 377 lb (171.006 kg)  BMI 51.12 kg/m2  SpO2 97%   CV: RRR without murmur, S1/S2, no JVD, no peripheral edema  Resp: clear to auscultation bilaterally, normal RR and effort noted  GI: morbidly obese, soft, no tenderness, with active bowel sounds. No guarding or palpable organomegaly noted.  Neuro: awake, alert and oriented x 3. Normal gross motor function and fluent speech   Assessment:  History of colon polyps  Plan:  Colonoscopy   [name]

## 2015-10-04 NOTE — Op Note (Signed)
[facility] Patient Name: Patrick Butler Procedure Date: 10/04/2015 MRN: [id number] Attending MD: Estill Cotta. Loletha Carrow , MD Date of Birth: Jul 31, 1960 CSN: [id number] Age: 55 Admit Type: Outpatient Procedure:                Colonoscopy Indications:              Surveillance: Personal history of adenomatous                            polyps on last colonoscopy > 5 years ago, ( < 1cm                            TA and < 1cm TVA in 08/2010) Providers:                Estill Cotta. Loletha Carrow, MD, Vista Lawman, RN, Corliss Parish, Technician, Carleene Cooper, CRNA Referring MD:              Medicines:                Monitored Anesthesia Care Complications:            No immediate complications. Estimated Blood Loss:     Estimated blood loss: none. Procedure:                Pre-Anesthesia Assessment:                           - Prior to the procedure, a History and Physical                            was performed, and patient medications and                            allergies were reviewed. The patient's tolerance of                            previous anesthesia was also reviewed. The risks                            and benefits of the procedure and the sedation                            options and risks were discussed with the patient.                            All questions were answered, and informed consent                            was obtained. Prior Anticoagulants: The patient has                            taken aspirin, last dose was 1 day prior to  procedure. ASA Grade Assessment: III - A patient                            with severe systemic disease. After reviewing the                            risks and benefits, the patient was deemed in                            satisfactory condition to undergo the procedure.                           After obtaining informed consent, the colonoscope                            was  passed under direct vision. Throughout the                            procedure, the patient's blood pressure, pulse, and                            oxygen saturations were monitored continuously. The                            [id number]) scope was introduced through                            the anus and advanced to the the cecum, identified                            by appendiceal orifice and ileocecal valve. The                            colonoscopy was performed without difficulty. The                            patient tolerated the procedure well. The quality                            of the bowel preparation was fair. The ileocecal                            valve, appendiceal orifice, and rectum were                            photographed. The bowel preparation used was                            Miralax. Scope In: 10:35:09 AM Scope Out: 10:47:21 AM Scope Withdrawal Time: 0 hours 7 minutes 46 seconds  Total Procedure Duration: 0 hours 12 minutes 12 seconds  Findings:      The perianal and digital rectal examinations were normal.      A 4 mm polyp was found in the hepatic flexure.  The polyp was sessile.       The polyp was removed with a cold snare. Resection and retrieval were       complete.      The exam was otherwise without abnormality on direct and retroflexion       views. Impression:               - One 4 mm polyp at the hepatic flexure, removed                            with a cold snare. Resected and retrieved.                           - The examination was otherwise normal on direct                            and retroflexion views. Moderate Sedation:      MAC sedation used Recommendation:           - Patient has a contact number available for                            emergencies. The signs and symptoms of potential                            delayed complications were discussed with the                            patient. Return to normal  activities tomorrow.                            Written discharge instructions were provided to the                            patient.                           - Resume previous diet.                           - No aspirin, ibuprofen, naproxen, or other                            non-steroidal anti-inflammatory drugs for 3 days                            after polyp removal.                           - Await pathology results.                           - Repeat colonoscopy is recommended for                            surveillance. The colonoscopy date will be  determined after pathology results from today's                            exam become available for review. Procedure Code(s):        --- Professional ---                           [phone], Colonoscopy, flexible; with removal of                            tumor(s), polyp(s), or other lesion(s) by snare                            technique Diagnosis Code(s):        --- Professional ---                           Z86.010, Personal history of colonic polyps                           D12.3, Benign neoplasm of transverse colon (hepatic                            flexure or splenic flexure) CPT copyright 2016 American Medical Association. All rights reserved. The codes documented in this report are preliminary and upon coder review may  be revised to meet current compliance requirements. Addy Mcmannis L. Loletha Carrow, MD 10/04/2015 11:02:44 AM This report has been signed electronically. Number of Addenda: 0

## 2015-10-04 NOTE — Discharge Instructions (Signed)
Please do not take aspirin for the next 3 days  Colonoscopy, Care After These instructions give you information on caring for yourself after your procedure. Your doctor may also give you more specific instructions. Call your doctor if you have any problems or questions after your procedure. HOME CARE  Do not drive for 24 hours.  Do not sign important papers or use machinery for 24 hours.  You may shower.  You may go back to your usual activities, but go slower for the first 24 hours.  Take rest breaks often during the first 24 hours.  Walk around or use warm packs on your belly (abdomen) if you have belly cramping or gas.  Drink enough fluids to keep your pee (urine) clear or pale yellow.  Resume your normal diet. Avoid heavy or fried foods.  Avoid drinking alcohol for 24 hours or as told by your doctor.  Only take medicines as told by your doctor. If a tissue sample (biopsy) was taken during the procedure:   Do not take aspirin or blood thinners for 7 days, or as told by your doctor.  Do not drink alcohol for 7 days, or as told by your doctor.  Eat soft foods for the first 24 hours. GET HELP IF: You still have a small amount of blood in your poop (stool) 2-3 days after the procedure. GET HELP RIGHT AWAY IF:  You have more than a small amount of blood in your poop.  You see clumps of tissue (blood clots) in your poop.  Your belly is puffy (swollen).  You feel sick to your stomach (nauseous) or throw up (vomit).  You have a fever.  You have belly pain that gets worse and medicine does not help. MAKE SURE YOU:  Understand these instructions.  Will watch your condition.  Will get help right away if you are not doing well or get worse.   This information is not intended to replace advice given to you by your health care provider. Make sure you discuss any questions you have with your health care provider.   Document Released: 05/26/2010 Document Revised:  04/28/2013 Document Reviewed: 12/29/2012 Elsevier Interactive Patient Education [employer].

## 2015-10-04 NOTE — Interval H&P Note (Signed)
History and Physical Interval Note:  10/04/2015 10:23 AM  Patrick Butler  has presented today for surgery, with the diagnosis of recall colon   The various methods of treatment have been discussed with the patient and family. After consideration of risks, benefits and other options for treatment, the patient has consented to  Procedure(s): COLONOSCOPY WITH PROPOFOL (N/A) as a surgical intervention .  The patient's history has been reviewed, patient examined, no change in status, stable for surgery.  I have reviewed the patient's chart and labs.  Questions were answered to the patient's satisfaction.     [name]

## 2015-10-04 NOTE — Anesthesia Postprocedure Evaluation (Signed)
Anesthesia Post Note  Patient: Patrick Butler  Procedure(s) Performed: Procedure(s) (LRB): COLONOSCOPY WITH PROPOFOL (N/A)  Patient location during evaluation: PACU Anesthesia Type: MAC Level of consciousness: awake and alert Pain management: pain level controlled Vital Signs Assessment: post-procedure vital signs reviewed and stable Respiratory status: spontaneous breathing, nonlabored ventilation, respiratory function stable and patient connected to nasal cannula oxygen Cardiovascular status: stable and blood pressure returned to baseline Anesthetic complications: no    Last Vitals:  Filed Vitals:   10/04/15 1110 10/04/15 1120  BP: 139/83 148/82  Pulse: 69 68  Temp:    Resp: 15 16    Last Pain: There were no vitals filed for this visit.               Kalmen Lollar J

## 2015-10-04 NOTE — Transfer of Care (Signed)
Immediate Anesthesia Transfer of Care Note  Patient: Patrick Butler  Procedure(s) Performed: Procedure(s): COLONOSCOPY WITH PROPOFOL (N/A)  Patient Location: PACU and Endoscopy Unit  Anesthesia Type:MAC  Level of Consciousness: awake, alert , oriented and patient cooperative  Airway & Oxygen Therapy: Patient Spontanous Breathing and Patient connected to face mask oxygen  Post-op Assessment: Report given to RN and Post -op Vital signs reviewed and stable  Post vital signs: Reviewed and stable  Last Vitals:  Filed Vitals:   10/04/15 0922  BP: 133/62  Pulse: 74  Temp: 36.7 C  Resp: 14    Last Pain: There were no vitals filed for this visit.       Complications: No apparent anesthesia complications

## 2015-10-04 NOTE — Anesthesia Preprocedure Evaluation (Addendum)
Anesthesia Evaluation  Patient identified by MRN, date of birth, ID band Patient awake    Reviewed: Allergy & Precautions, NPO status , Patient's Chart, lab work & pertinent test results  Airway Mallampati: II  TM Distance: >3 FB Neck ROM: Full    Dental no notable dental hx.    Pulmonary sleep apnea , former smoker,    Pulmonary exam normal breath sounds clear to auscultation       Cardiovascular hypertension, Pt. on medications Normal cardiovascular exam Rhythm:Regular Rate:Normal     Neuro/Psych Anxiety  Neuromuscular disease    GI/Hepatic Neg liver ROS, GERD  Medicated,  Endo/Other  diabetes, Type 2, Oral Hypoglycemic AgentsHypothyroidism Morbid obesity  Renal/GU negative Renal ROS  negative genitourinary   Musculoskeletal  (+) Arthritis ,   Abdominal (+) + obese,   Peds negative pediatric ROS (+)  Hematology negative hematology ROS (+)   Anesthesia Other Findings   Reproductive/Obstetrics negative OB ROS                            Anesthesia Physical Anesthesia Plan  ASA: III  Anesthesia Plan: MAC   Post-op Pain Management:    Induction: Intravenous  Airway Management Planned: Natural Airway  Additional Equipment:   Intra-op Plan:   Post-operative Plan:   Informed Consent: I have reviewed the patients History and Physical, chart, labs and discussed the procedure including the risks, benefits and alternatives for the proposed anesthesia with the patient or authorized representative who has indicated his/her understanding and acceptance.   Dental advisory given  Plan Discussed with: CRNA  Anesthesia Plan Comments:         Anesthesia Quick Evaluation

## 2015-10-05 ENCOUNTER — Encounter (HOSPITAL_COMMUNITY): Payer: Self-pay | Admitting: Gastroenterology

## 2015-10-05 ENCOUNTER — Encounter: Payer: Self-pay | Admitting: Gastroenterology

## 2015-10-07 DIAGNOSIS — M5412 Radiculopathy, cervical region: Secondary | ICD-10-CM | POA: Diagnosis not present

## 2015-10-08 ENCOUNTER — Other Ambulatory Visit: Payer: Self-pay | Admitting: Internal Medicine

## 2015-10-11 DIAGNOSIS — M25562 Pain in left knee: Secondary | ICD-10-CM | POA: Diagnosis not present

## 2015-10-12 DIAGNOSIS — H524 Presbyopia: Secondary | ICD-10-CM | POA: Diagnosis not present

## 2015-10-12 DIAGNOSIS — E119 Type 2 diabetes mellitus without complications: Secondary | ICD-10-CM | POA: Diagnosis not present

## 2015-10-12 LAB — HM DIABETES EYE EXAM

## 2015-10-13 ENCOUNTER — Other Ambulatory Visit: Payer: Self-pay | Admitting: Orthopedic Surgery

## 2015-10-18 ENCOUNTER — Encounter: Payer: Self-pay | Admitting: Internal Medicine

## 2015-10-20 ENCOUNTER — Encounter: Payer: Self-pay | Admitting: Internal Medicine

## 2015-10-24 ENCOUNTER — Encounter (HOSPITAL_COMMUNITY): Payer: Self-pay

## 2015-10-24 ENCOUNTER — Encounter (HOSPITAL_COMMUNITY)
Admission: RE | Admit: 2015-10-24 | Discharge: 2015-10-24 | Disposition: A | Discharge status: Home / Self Care | Payer: BLUE CROSS/BLUE SHIELD | Source: Ambulatory Visit | Attending: Orthopedic Surgery | Admitting: Orthopedic Surgery

## 2015-10-24 ENCOUNTER — Other Ambulatory Visit (HOSPITAL_COMMUNITY): Payer: Self-pay | Admitting: *Deleted

## 2015-10-24 DIAGNOSIS — Z7984 Long term (current) use of oral hypoglycemic drugs: Secondary | ICD-10-CM | POA: Insufficient documentation

## 2015-10-24 DIAGNOSIS — X58XXXA Exposure to other specified factors, initial encounter: Secondary | ICD-10-CM | POA: Diagnosis not present

## 2015-10-24 DIAGNOSIS — I10 Essential (primary) hypertension: Secondary | ICD-10-CM | POA: Diagnosis not present

## 2015-10-24 DIAGNOSIS — K219 Gastro-esophageal reflux disease without esophagitis: Secondary | ICD-10-CM | POA: Insufficient documentation

## 2015-10-24 DIAGNOSIS — G4733 Obstructive sleep apnea (adult) (pediatric): Secondary | ICD-10-CM | POA: Insufficient documentation

## 2015-10-24 DIAGNOSIS — E119 Type 2 diabetes mellitus without complications: Secondary | ICD-10-CM | POA: Insufficient documentation

## 2015-10-24 DIAGNOSIS — Z7982 Long term (current) use of aspirin: Secondary | ICD-10-CM | POA: Insufficient documentation

## 2015-10-24 DIAGNOSIS — Z79899 Other long term (current) drug therapy: Secondary | ICD-10-CM | POA: Insufficient documentation

## 2015-10-24 DIAGNOSIS — E785 Hyperlipidemia, unspecified: Secondary | ICD-10-CM | POA: Insufficient documentation

## 2015-10-24 DIAGNOSIS — E039 Hypothyroidism, unspecified: Secondary | ICD-10-CM | POA: Diagnosis not present

## 2015-10-24 DIAGNOSIS — Z87891 Personal history of nicotine dependence: Secondary | ICD-10-CM | POA: Insufficient documentation

## 2015-10-24 DIAGNOSIS — Z01818 Encounter for other preprocedural examination: Secondary | ICD-10-CM | POA: Insufficient documentation

## 2015-10-24 DIAGNOSIS — Z01812 Encounter for preprocedural laboratory examination: Secondary | ICD-10-CM | POA: Diagnosis not present

## 2015-10-24 DIAGNOSIS — S83242A Other tear of medial meniscus, current injury, left knee, initial encounter: Secondary | ICD-10-CM | POA: Diagnosis not present

## 2015-10-24 HISTORY — DX: Cervicalgia: M54.2

## 2015-10-24 LAB — BASIC METABOLIC PANEL
Anion gap: 5 (ref 5–15)
BUN: 11 mg/dL (ref 6–20)
CALCIUM: 9.2 mg/dL (ref 8.9–10.3)
CO2: 28 mmol/L (ref 22–32)
CREATININE: 1.07 mg/dL (ref 0.61–1.24)
Chloride: 106 mmol/L (ref 101–111)
GLUCOSE: 130 mg/dL — AB (ref 65–99)
Potassium: 4.6 mmol/L (ref 3.5–5.1)
Sodium: 139 mmol/L (ref 135–145)

## 2015-10-24 LAB — CBC
HCT: 42.8 % (ref 39.0–52.0)
Hemoglobin: 13.6 g/dL (ref 13.0–17.0)
MCH: 28.5 pg (ref 26.0–34.0)
MCHC: 31.8 g/dL (ref 30.0–36.0)
MCV: 89.7 fL (ref 78.0–100.0)
PLATELETS: 160 10*3/uL (ref 150–400)
RBC: 4.77 MIL/uL (ref 4.22–5.81)
RDW: 14 % (ref 11.5–15.5)
WBC: 10 10*3/uL (ref 4.0–10.5)

## 2015-10-24 LAB — GLUCOSE, CAPILLARY: GLUCOSE-CAPILLARY: 137 mg/dL — AB (ref 65–99)

## 2015-10-24 NOTE — Pre-Procedure Instructions (Signed)
ARWIN HAAG  10/24/2015      [facility] - [place] - [address] [address] [address] [place] [address] Phone: [phone] Fax: [phone]    Your procedure is scheduled on 10-28-2015    Friday  .  Report to [facility] Admitting at 10:30A.M.   Call this number if you have problems the morning of surgery:  [phone]   Remember:  Do not eat food or drink liquids after midnight.   Take these medicines the morning of surgery with A SIP OF WATER Atenolol(Tenormin),cetirizine(Zyrtec),gabapentin(neurotin)Levothyroxine(Synthroid) as directed,Lorazepam(Ativan) if needed,Pantoprazole(Protonix)                    STOP ASPIRIN,ANTI-INFLAMMATORIES,VITAMINS AND HERBAL SUPPLEMENTS 5-7 DAYS PRIOR TO SURGERY.     How to Manage Your Diabetes Before and After Surgery  Why is it important to control my blood sugar before and after surgery? . Improving blood sugar levels before and after surgery helps healing and can limit problems. . A way of improving blood sugar control is eating a healthy diet by: o  Eating less sugar and carbohydrates o  Increasing activity/exercise o  Talking with your doctor about reaching your blood sugar goals . High blood sugars (greater than 180 mg/dL) can raise your risk of infections and slow your recovery, so you will need to focus on controlling your diabetes during the weeks before surgery. . Make sure that the doctor who takes care of your diabetes knows about your planned surgery including the date and location.  How do I manage my blood sugar before surgery? . Check your blood sugar at least 4 times a day, starting 2 days before surgery, to make sure that the level is not too high or low. o Check your blood sugar the morning of your surgery when you wake up and every 2 hours until you get to the Short Stay unit. . If your blood sugar is less than 70 mg/dL, you will need to treat for  low blood sugar: o Do not take insulin. o Treat a low blood sugar (less than 70 mg/dL) with  cup of clear juice (cranberry or apple), 4 glucose tablets, OR glucose gel. o Recheck blood sugar in 15 minutes after treatment (to make sure it is greater than 70 mg/dL). If your blood sugar is not greater than 70 mg/dL on recheck, call [phone] for further instructions. . Report your blood sugar to the short stay nurse when you get to Short Stay.  . If you are admitted to the hospital after surgery: o Your blood sugar will be checked by the staff and you will probably be given insulin after surgery (instead of oral diabetes medicines) to make sure you have good blood sugar levels. o The goal for blood sugar control after surgery is 80-180 mg/dL.              WHAT DO I DO ABOUT MY DIABETES MEDICATION?   Marland Kitchen Do not take oral diabetes medicines (pills) the morning of surgery.    Reviewed and Endorsed by [facility] Patient Education Committee, August 2015    Do not wear jewelry,  Do not wear lotions, powders, or perfumes.  You may not wear deoderant.   Do not shave 48 hours prior to surgery.  Men may shave face and neck.   Do not bring valuables to the hospital.  [facility] is not responsible for any belongings or valuables.  Contacts, dentures or bridgework may not be worn into surgery.  Leave your suitcase in the car.  After surgery it may be brought to your room.  For patients admitted to the hospital, discharge time will be determined by your treatment team.  Patients discharged the day of surgery will not be allowed to drive home.    Special instructions:  See attached Sheet for instructions on CHG shower  Please read over the following fact sheets that you were given. Coughing and Deep Breathing and Surgical Site Infection Prevention

## 2015-10-25 ENCOUNTER — Telehealth: Payer: Self-pay

## 2015-10-25 LAB — HEMOGLOBIN A1C
Hgb A1c MFr Bld: 6.6 % — ABNORMAL HIGH (ref 4.8–5.6)
Mean Plasma Glucose: 143 mg/dL

## 2015-10-25 NOTE — Progress Notes (Signed)
Anesthesia Chart Review:  Pt is a 55 year old male scheduled for L knee arthroscopy on 10/28/2015 with Dr. Berenice Primas.   PCP is Billey Gosling, MD who has cleared pt for surgery.   PMH includes:  HTN, DM, hyperlipidemia, OSA, hypothyroidism, GERD. Former smoker. BMI 53. S/p lumbar laminectomy 05/24/11.   Medications include: amlodipine-benazepril, ASA, atenolol, iron, levothyroxine, metformin, protonix, pravastatin  Preoperative labs reviewed.  HgbA1c 6.6, glucose 130  EKG 10/24/15: NSR. Cannot rule out Anterior infarct, age undetermined. Inferior infarct, age undetermined. No significant change since 05/24/11.   If no changes, I anticipate pt can proceed with surgery as scheduled.   Willeen Cass, FNP-BC [facility]/Anesthesiology Phone: [phone] 10/25/2015 4:21 PM

## 2015-10-25 NOTE — Telephone Encounter (Signed)
Fax [phone] Make to [facility].  Can you fax over the letter.

## 2015-10-27 MED ORDER — DEXTROSE 5 % IV SOLN
3.0000 g | INTRAVENOUS | Status: AC
  Administered 2015-10-28: 3 g via INTRAVENOUS
  Filled 2015-10-27: qty 3000

## 2015-10-27 NOTE — Telephone Encounter (Signed)
Letter was faxed 10/26/15

## 2015-10-27 NOTE — H&P (Signed)
PREOPERATIVE H&P  Chief Complaint: left knee pain  HPI: Patrick Butler is a 55 y.o. male who presents for evaluation of left knee pain. It has been present for 6 months or more and has been worsening. He has failed conservative measures. Pain is rated as moderate.  Past Medical History  Diagnosis Date  . Cancer ([place]) 2005    tonsils/Chemo and radiation  . GERD (gastroesophageal reflux disease)   . Hyperlipidemia   . Hypothyroidism   . Hypertension     states under control with med., has been on med. ~ 5 yr.  . Non-insulin dependent type 2 diabetes mellitus ([place])   . Sleep apnea     uses CPAP nightly  . Dysphagia 08/2014  . Anxiety   . Arthritis     right knee  . Full dentures   . Seasonal allergies   . History of chemotherapy   . History of radiation therapy     neck  . Posterior neck pain    Past Surgical History  Procedure Laterality Date  . Laparoscopic gastric bypass  2004  . Knee arthroscopy Right     x 3  . Replacement total knee Right 03/22/2004  . Shoulder arthroscopy Left   . Shoulder open rotator cuff repair Right   . Lymphadenectomy Right     cervical  . Port-a-cath removal    . Portacath placement    . Multiple tooth extractions    . Lumbar laminectomy/decompression microdiscectomy  05/24/2011    Procedure: LUMBAR LAMINECTOMY/DECOMPRESSION MICRODISCECTOMY;  Surgeon: Winfield Cunas, MD;  Location: [facility];  Service: Neurosurgery;  Laterality: Right;  RIGHT L5-S1 Laminectomy, discectomy  . Colonoscopy  2012  . Direct laryngoscopy N/A 08/20/2014    Procedure: DIRECT LARYNGOSCOPY;  Surgeon: Rozetta Nunnery, MD;  Location: [facility];  Service: ENT;  Laterality: N/A;  . Esophagoscopy with dilitation N/A 08/20/2014    Procedure: ESOPHAGOSCOPY WITH DILATATION;  Surgeon: Rozetta Nunnery, MD;  Location: [facility];  Service: ENT;  Laterality: N/A;  . Colonoscopy with propofol N/A 10/04/2015    Procedure: COLONOSCOPY WITH  PROPOFOL;  Surgeon: Doran Stabler, MD;  Location: [facility];  Service: Gastroenterology;  Laterality: N/A;   Social History   Social History  . Marital Status: Married    Spouse Name: [name]  . Number of Children: N/A  . Years of Education: N/A   Occupational History  . [employer]    Social History Main Topics  . Smoking status: Former Smoker -- 0.00 packs/day for 25 years    Quit date: 10/25/2002  . Smokeless tobacco: Never Used  . Alcohol Use: 1.8 - 2.4 oz/week    3-4 Standard drinks or equivalent per week  . Drug Use: No  . Sexual Activity: Yes   Other Topics Concern  . Not on file   Social History Narrative   Family History  Problem Relation Age of Onset  . Colon cancer Paternal Uncle   . Heart attack Maternal Uncle      X 2; both pre 55  . Hypertension Mother   . Breast cancer Mother   . Brain cancer Paternal Uncle   . Diabetes Maternal Grandfather   . Hypertension Maternal Grandfather     ? metastatic; ? primary  . Diabetes Father   . Stroke Father 28  . Coronary artery disease Mother    Allergies  Allergen Reactions  . Sulfa Antibiotics Rash   Prior to Admission  medications   Medication Sig Start Date End Date Taking? Authorizing Provider  amLODipine-benazepril (LOTREL) 5-40 MG capsule TAKE 1 CAPSULE BY MOUTH DAILY 10/08/15  Yes Binnie Rail, MD  aspirin EC 81 MG tablet Take 81 mg by mouth daily.   Yes Historical Provider, MD  atenolol (TENORMIN) 50 MG tablet Take 1 tablet (50 mg total) by mouth daily. 07/13/15  Yes Binnie Rail, MD  calcium carbonate (OS-CAL) 600 MG TABS Take 600 mg by mouth daily.     Yes Historical Provider, MD  cetirizine (ZYRTEC) 10 MG tablet Take 10 mg by mouth daily.   Yes Historical Provider, MD  Cyanocobalamin (B-12 DOTS) 500 MCG SUBL Place 500 mcg under the tongue daily.    Yes Historical Provider, MD  ferrous gluconate (FERGON) 325 MG tablet Take 325 mg by mouth daily before breakfast. Reported on 08/23/2015   Yes  Historical Provider, MD  fluticasone (FLONASE) 50 MCG/ACT nasal spray Place 2 sprays into the nose as directed. At bedtime Patient taking differently: Place 2 sprays into the nose at bedtime.  11/20/11  Yes Hendricks Limes, MD  gabapentin (NEURONTIN) 300 MG capsule Take 300 mg by mouth 3 (three) times daily.    Yes Historical Provider, MD  levothyroxine (SYNTHROID, LEVOTHROID) 175 MCG tablet Take 1 tablet (175 mcg total) by mouth daily. Two days of the week take two pills Patient taking differently: Take 175-350 mcg by mouth daily before breakfast. Take one tablet everyday except for on Tuesday and Friday. Take two tablets on those two days. 07/14/15  Yes Binnie Rail, MD  LORazepam (ATIVAN) 0.5 MG tablet Take 1 tablet (0.5 mg total) by mouth daily as needed for anxiety. 07/13/15  Yes Binnie Rail, MD  metFORMIN (GLUCOPHAGE) 500 MG tablet TAKE 1 TABLET BY MOUTH TWICE DAILY WITH MEALS 08/15/15  Yes Binnie Rail, MD  Multiple Vitamins-Minerals (MULTIVITAMIN WITH MINERALS) tablet Take 1 tablet by mouth daily.    Yes Historical Provider, MD  OVER THE COUNTER MEDICATION Take 15 mLs by mouth daily. Glucosamine & Chondroitin Liquid   Yes Historical Provider, MD  pantoprazole (PROTONIX) 40 MG tablet TAKE 1 TABLET BY MOUTH DAILY FOR REFLUX 09/19/15  Yes Binnie Rail, MD  pravastatin (PRAVACHOL) 40 MG tablet Take 1 tablet (40 mg total) by mouth daily. 07/15/15  Yes Binnie Rail, MD  pregabalin (LYRICA) 75 MG capsule Take 75 mg by mouth 2 (two) times daily.   Yes Historical Provider, MD     Positive ROS: none  All other systems have been reviewed and were otherwise negative with the exception of those mentioned in the HPI and as above.  Physical Exam: There were no vitals filed for this visit.  General: Alert, no acute distress Cardiovascular: No pedal edema Respiratory: No cyanosis, no use of accessory musculature GI: No organomegaly, abdomen is soft and non-tender Skin: No lesions in the area of  chief complaint Neurologic: Sensation intact distally Psychiatric: Patient is competent for consent with normal mood and affect Lymphatic: No axillary or cervical lymphadenopathy  MUSCULOSKELETAL: left knee: Painful range of motion.  Limited range of motion.  Significant medial joint line tenderness.  Pain and grinding through range of motion.  Assessment/Plan: LEFT KNEE MEDIAL MENISCUS TEAR Plan for Procedure(s): ARTHROSCOPY KNEE  The risks benefits and alternatives were discussed with the patient including but not limited to the risks of nonoperative treatment, versus surgical intervention including infection, bleeding, nerve injury, malunion, nonunion, hardware prominence, hardware failure, need for  hardware removal, blood clots, cardiopulmonary complications, morbidity, mortality, among others, and they were willing to proceed.  Predicted outcome is good, although there will be at least a six to nine month expected recovery.  Alta Corning, MD 10/27/2015 11:39 PM

## 2015-10-28 ENCOUNTER — Encounter (HOSPITAL_COMMUNITY): Payer: Self-pay | Admitting: Surgery

## 2015-10-28 ENCOUNTER — Ambulatory Visit (HOSPITAL_COMMUNITY)
Admission: RE | Admit: 2015-10-28 | Discharge: 2015-10-28 | Disposition: A | Discharge status: Home / Self Care | Payer: BLUE CROSS/BLUE SHIELD | Source: Ambulatory Visit | Attending: Orthopedic Surgery | Admitting: Orthopedic Surgery

## 2015-10-28 ENCOUNTER — Ambulatory Visit (HOSPITAL_COMMUNITY): Payer: BLUE CROSS/BLUE SHIELD | Admitting: Emergency Medicine

## 2015-10-28 ENCOUNTER — Encounter (HOSPITAL_COMMUNITY)
Admission: RE | Disposition: A | Discharge status: Home / Self Care | Payer: Self-pay | Source: Ambulatory Visit | Attending: Orthopedic Surgery

## 2015-10-28 ENCOUNTER — Ambulatory Visit (HOSPITAL_COMMUNITY): Payer: BLUE CROSS/BLUE SHIELD | Admitting: Certified Registered"

## 2015-10-28 DIAGNOSIS — Z8521 Personal history of malignant neoplasm of larynx: Secondary | ICD-10-CM | POA: Insufficient documentation

## 2015-10-28 DIAGNOSIS — Z9884 Bariatric surgery status: Secondary | ICD-10-CM | POA: Insufficient documentation

## 2015-10-28 DIAGNOSIS — M6752 Plica syndrome, left knee: Secondary | ICD-10-CM | POA: Diagnosis not present

## 2015-10-28 DIAGNOSIS — I10 Essential (primary) hypertension: Secondary | ICD-10-CM | POA: Insufficient documentation

## 2015-10-28 DIAGNOSIS — F419 Anxiety disorder, unspecified: Secondary | ICD-10-CM | POA: Insufficient documentation

## 2015-10-28 DIAGNOSIS — S83242A Other tear of medial meniscus, current injury, left knee, initial encounter: Secondary | ICD-10-CM | POA: Diagnosis not present

## 2015-10-28 DIAGNOSIS — M1990 Unspecified osteoarthritis, unspecified site: Secondary | ICD-10-CM | POA: Diagnosis not present

## 2015-10-28 DIAGNOSIS — Z87891 Personal history of nicotine dependence: Secondary | ICD-10-CM | POA: Diagnosis not present

## 2015-10-28 DIAGNOSIS — E039 Hypothyroidism, unspecified: Secondary | ICD-10-CM | POA: Diagnosis not present

## 2015-10-28 DIAGNOSIS — Z9221 Personal history of antineoplastic chemotherapy: Secondary | ICD-10-CM | POA: Insufficient documentation

## 2015-10-28 DIAGNOSIS — Z96651 Presence of right artificial knee joint: Secondary | ICD-10-CM | POA: Insufficient documentation

## 2015-10-28 DIAGNOSIS — Z7984 Long term (current) use of oral hypoglycemic drugs: Secondary | ICD-10-CM | POA: Insufficient documentation

## 2015-10-28 DIAGNOSIS — K219 Gastro-esophageal reflux disease without esophagitis: Secondary | ICD-10-CM | POA: Diagnosis not present

## 2015-10-28 DIAGNOSIS — Z7951 Long term (current) use of inhaled steroids: Secondary | ICD-10-CM | POA: Diagnosis not present

## 2015-10-28 DIAGNOSIS — Z923 Personal history of irradiation: Secondary | ICD-10-CM | POA: Insufficient documentation

## 2015-10-28 DIAGNOSIS — X58XXXA Exposure to other specified factors, initial encounter: Secondary | ICD-10-CM | POA: Insufficient documentation

## 2015-10-28 DIAGNOSIS — M2242 Chondromalacia patellae, left knee: Secondary | ICD-10-CM | POA: Insufficient documentation

## 2015-10-28 DIAGNOSIS — Z7982 Long term (current) use of aspirin: Secondary | ICD-10-CM | POA: Insufficient documentation

## 2015-10-28 DIAGNOSIS — E785 Hyperlipidemia, unspecified: Secondary | ICD-10-CM | POA: Diagnosis not present

## 2015-10-28 DIAGNOSIS — Z6841 Body Mass Index (BMI) 40.0 and over, adult: Secondary | ICD-10-CM | POA: Insufficient documentation

## 2015-10-28 DIAGNOSIS — M94262 Chondromalacia, left knee: Secondary | ICD-10-CM | POA: Diagnosis not present

## 2015-10-28 DIAGNOSIS — G4730 Sleep apnea, unspecified: Secondary | ICD-10-CM | POA: Diagnosis not present

## 2015-10-28 DIAGNOSIS — M23322 Other meniscus derangements, posterior horn of medial meniscus, left knee: Secondary | ICD-10-CM | POA: Diagnosis not present

## 2015-10-28 HISTORY — PX: KNEE ARTHROSCOPY: SHX127

## 2015-10-28 LAB — GLUCOSE, CAPILLARY
Glucose-Capillary: 130 mg/dL — ABNORMAL HIGH (ref 65–99)
Glucose-Capillary: 115 mg/dL — ABNORMAL HIGH (ref 65–99)

## 2015-10-28 SURGERY — ARTHROSCOPY, KNEE
Anesthesia: General | Site: Knee | Laterality: Left

## 2015-10-28 MED ORDER — LIDOCAINE HCL (CARDIAC) 20 MG/ML IV SOLN
INTRAVENOUS | Status: DC | PRN
  Administered 2015-10-28: 80 mg via INTRAVENOUS

## 2015-10-28 MED ORDER — BUPIVACAINE HCL (PF) 0.25 % IJ SOLN
INTRAMUSCULAR | Status: AC
Start: 2015-10-28 — End: 2015-10-28
  Filled 2015-10-28: qty 30

## 2015-10-28 MED ORDER — HYDROMORPHONE HCL 1 MG/ML IJ SOLN: 0.2500 mg | INTRAMUSCULAR | Status: DC | PRN

## 2015-10-28 MED ORDER — FENTANYL CITRATE (PF) 100 MCG/2ML IJ SOLN
INTRAMUSCULAR | Status: DC | PRN
  Administered 2015-10-28: 100 ug via INTRAVENOUS

## 2015-10-28 MED ORDER — EPHEDRINE SULFATE 50 MG/ML IJ SOLN
INTRAMUSCULAR | Status: DC | PRN
  Administered 2015-10-28: 10 mg via INTRAVENOUS

## 2015-10-28 MED ORDER — SUGAMMADEX SODIUM 500 MG/5ML IV SOLN
INTRAVENOUS | Status: AC
  Filled 2015-10-28: qty 5

## 2015-10-28 MED ORDER — HYDROCODONE-ACETAMINOPHEN 7.5-325 MG PO TABS
ORAL_TABLET | ORAL | Status: DC
Start: 2015-10-28 — End: 2015-10-28
  Filled 2015-10-28: qty 2

## 2015-10-28 MED ORDER — LACTATED RINGERS IV SOLN
INTRAVENOUS | Status: DC
  Administered 2015-10-28 (×2): via INTRAVENOUS

## 2015-10-28 MED ORDER — PHENYLEPHRINE HCL 10 MG/ML IJ SOLN
INTRAMUSCULAR | Status: DC | PRN
  Administered 2015-10-28 (×3): 40 ug via INTRAVENOUS
  Administered 2015-10-28: 80 ug via INTRAVENOUS
  Administered 2015-10-28: 40 ug via INTRAVENOUS

## 2015-10-28 MED ORDER — SODIUM CHLORIDE 0.9 % IR SOLN
Status: DC | PRN
  Administered 2015-10-28: 3000 mL

## 2015-10-28 MED ORDER — HYDROCODONE-ACETAMINOPHEN 10-325 MG PO TABS: 1.0000 | ORAL_TABLET | Freq: Four times a day (QID) | ORAL | Status: DC | PRN

## 2015-10-28 MED ORDER — MIDAZOLAM HCL 5 MG/5ML IJ SOLN
INTRAMUSCULAR | Status: DC | PRN
  Administered 2015-10-28: 2 mg via INTRAVENOUS

## 2015-10-28 MED ORDER — SUGAMMADEX SODIUM 200 MG/2ML IV SOLN
INTRAVENOUS | Status: DC | PRN
  Administered 2015-10-28: 348.4 mg via INTRAVENOUS

## 2015-10-28 MED ORDER — CHLORHEXIDINE GLUCONATE 4 % EX LIQD: 60.0000 mL | Freq: Once | CUTANEOUS | Status: DC

## 2015-10-28 MED ORDER — ROCURONIUM BROMIDE 100 MG/10ML IV SOLN
INTRAVENOUS | Status: DC | PRN
  Administered 2015-10-28: 30 mg via INTRAVENOUS

## 2015-10-28 MED ORDER — OXYCODONE-ACETAMINOPHEN 5-325 MG PO TABS: 1.0000 | ORAL_TABLET | Freq: Four times a day (QID) | ORAL | Status: DC | PRN

## 2015-10-28 MED ORDER — BUPIVACAINE HCL (PF) 0.25 % IJ SOLN
INTRAMUSCULAR | Status: DC | PRN
  Administered 2015-10-28: 20 mL

## 2015-10-28 MED ORDER — SUCCINYLCHOLINE CHLORIDE 20 MG/ML IJ SOLN
INTRAMUSCULAR | Status: DC | PRN
  Administered 2015-10-28: 200 mg via INTRAVENOUS

## 2015-10-28 MED ORDER — MIDAZOLAM HCL 2 MG/2ML IJ SOLN
INTRAMUSCULAR | Status: AC
  Filled 2015-10-28: qty 2

## 2015-10-28 MED ORDER — PROPOFOL 10 MG/ML IV BOLUS
INTRAVENOUS | Status: DC | PRN
  Administered 2015-10-28: 200 mg via INTRAVENOUS

## 2015-10-28 MED ORDER — PROPOFOL 10 MG/ML IV BOLUS
INTRAVENOUS | Status: AC
Start: 2015-10-28 — End: 2015-10-28
  Filled 2015-10-28: qty 20

## 2015-10-28 MED ORDER — FENTANYL CITRATE (PF) 250 MCG/5ML IJ SOLN
INTRAMUSCULAR | Status: AC
Start: 2015-10-28 — End: 2015-10-28
  Filled 2015-10-28: qty 5

## 2015-10-28 MED ORDER — HYDROCODONE-ACETAMINOPHEN 7.5-325 MG PO TABS
2.0000 | ORAL_TABLET | Freq: Once | ORAL | Status: AC
  Administered 2015-10-28: 2 via ORAL

## 2015-10-28 MED ORDER — PROPOFOL 10 MG/ML IV BOLUS
INTRAVENOUS | Status: AC
  Filled 2015-10-28: qty 20

## 2015-10-28 MED ORDER — ONDANSETRON HCL 4 MG/2ML IJ SOLN
INTRAMUSCULAR | Status: DC | PRN
  Administered 2015-10-28: 4 mg via INTRAVENOUS

## 2015-10-28 MED ORDER — EPINEPHRINE HCL 1 MG/ML IJ SOLN
INTRAMUSCULAR | Status: AC
  Filled 2015-10-28: qty 1

## 2015-10-28 SURGICAL SUPPLY — 37 items
BANDAGE ELASTIC 6 VELCRO ST LF (GAUZE/BANDAGES/DRESSINGS) ×2 IMPLANT
BLADE CUDA 5.5 (BLADE) IMPLANT
BLADE CUTTER GATOR 3.5 (BLADE) IMPLANT
BLADE GREAT WHITE 4.2 (BLADE) ×2 IMPLANT
BNDG GAUZE ELAST 4 BULKY (GAUZE/BANDAGES/DRESSINGS) ×2 IMPLANT
CUFF TOURNIQUET SINGLE 34IN LL (TOURNIQUET CUFF) IMPLANT
CUFF TOURNIQUET SINGLE 44IN (TOURNIQUET CUFF) IMPLANT
DRAPE ARTHROSCOPY W/POUCH 114 (DRAPES) ×2 IMPLANT
DRAPE PROXIMA HALF (DRAPES) ×2 IMPLANT
DRAPE U-SHAPE 47X51 STRL (DRAPES) ×2 IMPLANT
DRSG EMULSION OIL 3X3 NADH (GAUZE/BANDAGES/DRESSINGS) ×2 IMPLANT
DRSG PAD ABDOMINAL 8X10 ST (GAUZE/BANDAGES/DRESSINGS) ×2 IMPLANT
DURAPREP 26ML APPLICATOR (WOUND CARE) ×2 IMPLANT
FILTER STRAW FLUID ASPIR (MISCELLANEOUS) ×2 IMPLANT
GAUZE SPONGE 4X4 12PLY STRL (GAUZE/BANDAGES/DRESSINGS) ×2 IMPLANT
GLOVE BIOGEL PI IND STRL 8 (GLOVE) ×2 IMPLANT
GLOVE BIOGEL PI INDICATOR 8 (GLOVE) ×2
GLOVE ECLIPSE 7.5 STRL STRAW (GLOVE) ×4 IMPLANT
GOWN STRL REUS W/ TWL LRG LVL3 (GOWN DISPOSABLE) ×1 IMPLANT
GOWN STRL REUS W/ TWL XL LVL3 (GOWN DISPOSABLE) ×2 IMPLANT
GOWN STRL REUS W/TWL LRG LVL3 (GOWN DISPOSABLE) ×2
GOWN STRL REUS W/TWL XL LVL3 (GOWN DISPOSABLE) ×4
KIT BASIN OR (CUSTOM PROCEDURE TRAY) ×2 IMPLANT
KIT ROOM TURNOVER OR (KITS) ×2 IMPLANT
NDL 18GX1X1/2 (RX/OR ONLY) (NEEDLE) ×1 IMPLANT
NEEDLE 18GX1X1/2 (RX/OR ONLY) (NEEDLE) ×2 IMPLANT
PACK ARTHROSCOPY DSU (CUSTOM PROCEDURE TRAY) ×2 IMPLANT
PAD ARMBOARD 7.5X6 YLW CONV (MISCELLANEOUS) ×4 IMPLANT
PAD CAST 4YDX4 CTTN HI CHSV (CAST SUPPLIES) ×2 IMPLANT
PADDING CAST COTTON 4X4 STRL (CAST SUPPLIES) ×2
SET ARTHROSCOPY TUBING (MISCELLANEOUS) ×2
SET ARTHROSCOPY TUBING LN (MISCELLANEOUS) ×1 IMPLANT
SUT ETHILON 4 0 PS 2 18 (SUTURE) ×2 IMPLANT
SYR 5ML LL (SYRINGE) ×2 IMPLANT
TOWEL OR 17X24 6PK STRL BLUE (TOWEL DISPOSABLE) ×2 IMPLANT
TOWEL OR 17X26 10 PK STRL BLUE (TOWEL DISPOSABLE) ×2 IMPLANT
WRAP KNEE MAXI GEL POST OP (GAUZE/BANDAGES/DRESSINGS) ×2 IMPLANT

## 2015-10-28 NOTE — Transfer of Care (Signed)
Immediate Anesthesia Transfer of Care Note  Patient: Patrick Butler  Procedure(s) Performed: Procedure(s): ARTHROSCOPY KNEE (Left)  Patient Location: PACU  Anesthesia Type:General  Level of Consciousness: awake, alert  and oriented  Airway & Oxygen Therapy: Patient connected to face mask oxygen  Post-op Assessment: Post -op Vital signs reviewed and stable  Post vital signs: stable  Last Vitals:  Filed Vitals:   10/28/15 0959 10/28/15 1001  BP:  131/62  Pulse: 78   Temp: 37 C   Resp: 18     Last Pain: There were no vitals filed for this visit.    Patients Stated Pain Goal: 6 ([id number])  Complications: No apparent anesthesia complications

## 2015-10-28 NOTE — Discharge Instructions (Signed)
POST-OP KNEE ARTHROSCOPY INSTRUCTIONS  Dr. Alain Marion PA-C  Pain You will be expected to have a moderate amount of pain in the affected knee for approximately two weeks. However, the first two days will be the most severe pain. A prescription has been provided to take as needed for the pain. The pain can be reduced by applying ice packs to the knee for the first 1-2 weeks post surgery. Also, keeping the leg elevated on pillows will help alleviate the pain. If you develop any acute pain or swelling in your calf muscle, please call the doctor.  Activity It is preferred that you stay at bed rest for approximately 24 hours. However, you may go to the bathroom with help. Weight bearing as tolerated. You may begin the knee exercises the day of surgery. Discontinue crutches as the knee pain resolves.  Dressing Keep the dressing dry. If the ace bandage should wrinkle or roll up, this can be rewrapped to prevent ridges in the bandage. You may remove all dressings in 48 hours,  apply bandaids to each wound. You may shower on the 4th day after surgery but no tub bath.  Symptoms to report to your doctor Extreme pain Extreme swelling Temperature above 101 degrees Change in the feeling, color, or movement of your toes Redness, heat, or swelling at your incision  Exercise If is preferred that as soon as possible you try to do a straight leg raise without bending the knee and concentrate on bringing the heel of your foot off the bed up to approximately 45 degrees and hold for the count of 10 seconds. Repeat this at least 10 times three or four times per day. Additional exercises are provided below.  You are encouraged to bend the knee as tolerated.  Follow-Up Call to schedule a follow-up appointment in 5-7 days. Our office # is [phone].  POST-OP EXERCISES  Short Arc Quads  1. Lie on back with legs straight. Place towel roll under thigh, just above knee. 2. Tighten thigh muscles to  straighten knee and lift heel off bed. 3. Hold for slow count of five, then lower. 4. Do three sets of ten    Straight Leg Raises  1. Lie on back with operative leg straight and other leg bent. 2. Keeping operative leg completely straight, slowly lift operative leg so foot is 5 inches off bed. 3. Hold for slow count of five, then lower. 4. Do three sets of ten.    DO BOTH EXERCISES 2 TIMES A DAY  Ankle Pumps  Work/move the operative ankle and foot up and down 10 times every hour while awake.POST-OP KNEE ARTHROSCOPY INSTRUCTIONS  Dr. Alain Marion PA-C  Pain You will be expected to have a moderate amount of pain in the affected knee for approximately two weeks. However, the first two days will be the most severe pain. A prescription has been provided to take as needed for the pain. The pain can be reduced by applying ice packs to the knee for the first 1-2 weeks post surgery. Also, keeping the leg elevated on pillows will help alleviate the pain. If you develop any acute pain or swelling in your calf muscle, please call the doctor.  Activity It is preferred that you stay at bed rest for approximately 24 hours. However, you may go to the bathroom with help. Weight bearing as tolerated. You may begin the knee exercises the day of surgery. Discontinue crutches as the knee pain resolves.  Dressing Keep the  dressing dry. If the ace bandage should wrinkle or roll up, this can be rewrapped to prevent ridges in the bandage. You may remove all dressings in 48 hours,  apply bandaids to each wound. You may shower on the 4th day after surgery but no tub bath.  Symptoms to report to your doctor Extreme pain Extreme swelling Temperature above 101 degrees Change in the feeling, color, or movement of your toes Redness, heat, or swelling at your incision  Exercise If is preferred that as soon as possible you try to do a straight leg raise without bending the knee and concentrate on  bringing the heel of your foot off the bed up to approximately 45 degrees and hold for the count of 10 seconds. Repeat this at least 10 times three or four times per day. Additional exercises are provided below.  You are encouraged to bend the knee as tolerated.  Follow-Up Call to schedule a follow-up appointment in 5-7 days. Our office # is [phone].  POST-OP EXERCISES  Short Arc Quads  1. Lie on back with legs straight. Place towel roll under thigh, just above knee. 2. Tighten thigh muscles to straighten knee and lift heel off bed. 3. Hold for slow count of five, then lower. 4. Do three sets of ten    Straight Leg Raises  1. Lie on back with operative leg straight and other leg bent. 2. Keeping operative leg completely straight, slowly lift operative leg so foot is 5 inches off bed. 3. Hold for slow count of five, then lower. 4. Do three sets of ten.    DO BOTH EXERCISES 2 TIMES A DAY  Ankle Pumps  Work/move the operative ankle and foot up and down 10 times every hour while awake.

## 2015-10-28 NOTE — Progress Notes (Signed)
Orthopedic Tech Progress Note Patient Details:  Patrick Butler [birth year] [id number]  Ortho Devices Type of Ortho Device: Crutches Ortho Device/Splint Interventions: Application   [place] Pink 10/28/2015, 2:45 PM

## 2015-10-28 NOTE — Anesthesia Preprocedure Evaluation (Addendum)
Anesthesia Evaluation  Patient identified by MRN, date of birth, ID band Patient awake    Reviewed: Allergy & Precautions, H&P , NPO status , Patient's Chart, lab work & pertinent test results, reviewed documented beta blocker date and time   Airway Mallampati: III  TM Distance: >3 FB Neck ROM: Full    Dental no notable dental hx. (+) Teeth Intact, Dental Advisory Given   Pulmonary sleep apnea and Continuous Positive Airway Pressure Ventilation , former smoker,    Pulmonary exam normal breath sounds clear to auscultation       Cardiovascular hypertension, Pt. on medications and Pt. on home beta blockers  Rhythm:Regular Rate:Normal     Neuro/Psych Anxiety negative neurological ROS  negative psych ROS   GI/Hepatic Neg liver ROS, GERD  Medicated and Controlled,  Endo/Other  diabetes, Type 2, Oral Hypoglycemic AgentsHypothyroidism Morbid obesity  Renal/GU negative Renal ROS  negative genitourinary   Musculoskeletal  (+) Arthritis , Osteoarthritis,    Abdominal   Peds  Hematology negative hematology ROS (+)   Anesthesia Other Findings   Reproductive/Obstetrics negative OB ROS                         Anesthesia Physical Anesthesia Plan  ASA: III  Anesthesia Plan: General   Post-op Pain Management:    Induction: Intravenous  Airway Management Planned: LMA and Oral ETT  Additional Equipment:   Intra-op Plan:   Post-operative Plan: Extubation in OR  Informed Consent: I have reviewed the patients History and Physical, chart, labs and discussed the procedure including the risks, benefits and alternatives for the proposed anesthesia with the patient or authorized representative who has indicated his/her understanding and acceptance.   Dental advisory given  Plan Discussed with: CRNA  Anesthesia Plan Comments:         Anesthesia Quick Evaluation

## 2015-10-28 NOTE — Anesthesia Procedure Notes (Signed)
Procedure Name: Intubation Date/Time: 10/28/2015 12:55 PM Performed by: Lavell Luster Pre-anesthesia Checklist: Patient identified, Emergency Drugs available, Suction available, Patient being monitored and Timeout performed Patient Re-evaluated:Patient Re-evaluated prior to inductionOxygen Delivery Method: Circle system utilized Preoxygenation: Pre-oxygenation with 100% oxygen Intubation Type: IV induction Ventilation: Mask ventilation without difficulty Laryngoscope Size: Mac and 4 Grade View: Grade I Tube type: Oral Tube size: 7.5 mm Number of attempts: 1 Airway Equipment and Method: Stylet Placement Confirmation: ETT inserted through vocal cords under direct vision,  positive ETCO2 and breath sounds checked- equal and bilateral Secured at: 22 cm Tube secured with: Tape Dental Injury: Teeth and Oropharynx as per pre-operative assessment  Difficulty Due To: Difficulty was anticipated

## 2015-10-28 NOTE — Op Note (Signed)
NAME:  Patrick Butler, Patrick Butler NO.:  [id number]  MEDICAL RECORD NO.:  [id number]  LOCATION:  [facility]                         FACILITY:  [facility]  PHYSICIAN:  Alta Corning, M.D.   DATE OF BIRTH:  1960/06/16  DATE OF PROCEDURE:  10/28/2015 DATE OF DISCHARGE:                              OPERATIVE REPORT   PREOPERATIVE DIAGNOSIS:  Medial meniscal tear.  POSTOPERATIVE DIAGNOSES: 1. Medial meniscal tear. 2. Chondromalacia, medial femoral condyle and patellofemoral joint. 3. Medial shelf plica.  PROCEDURES: 1. Partial medial meniscectomy of the midbody and posterior horn     comprising about 20% of the meniscus. 2. Chondroplasty of medial femoral condyle down to bleeding bone were     necessary. 3. Chondroplasty patellofemoral joint down to bleeding bone were     necessary. 4. Debridement of medial shelf plica excision.  SURGEON:  Alta Corning, M.D.  ASSISTANT:  Gary Fleet, P.A.  ANESTHESIA:  General.  BRIEF HISTORY:  Mr. [name] is a 55 year old morbidly obese male who is having significant complaints of left knee pain.  We have been treating him for prolonged period of time.  After failure of conservative care, he was taken to the operating room for operative knee arthroscopy.  He had narrowing on the arm board side but not bone-on-bone change and obviously he was not a candidate for total knee replacement based on his size.  We felt that arthroscopy was reasonable.  He was brought to the operating room for this procedure.  DESCRIPTION OF PROCEDURE:  The patient was brought to the operating room.  After adequate anesthesia was obtained with general anesthetic, the patient was placed supine on the operating table.  The left leg was prepped and draped in usual sterile fashion.  Following this, routine arthroscopic examination of the knee revealed there was chondromalacia of the patellofemoral joint, grade 2 and grade 3 in nature.  This was debrided back to a  smooth and stable rim where articular cartilage and down to bleeding bone were necessary.  Attention turned to the medial compartment where large medial shelf plica was draping the compartment. This was debrided, so we could get access into the medial compartment. Once this was done, we gained access into the posterior horn of the medial compartment where there was a complex tear of the posterior horn with flaps anteriorly and posteriorly.  This was debrided back to a smooth and stable rim of meniscus.  Once this was completed, attention was turned to the medial and femoral condyle which had some grade 2, grade 3, and small areas of grade 4 changes debrided back to a smooth and stable rim where articular cartilage down to bleeding bone were necessary.  Attention was then turned to the ACL, normal.  Attention was turned to the lateral side, normal.  At this time, the knee was copiously and thoroughly irrigated, suctioned dry.  The arthroscopic portals were closed with a bandage.  A sterile compressive dressing was applied.  The patient was taken to the recovery room where he was noted to be in satisfactory condition.  The estimated blood loss for the procedure was minimal.     Alta Corning,  M.D.     JLG/MEDQ  D:  10/28/2015  T:  10/28/2015  Job:  [id number]  cc:   Alta Corning, M.D.

## 2015-10-28 NOTE — Anesthesia Postprocedure Evaluation (Signed)
Anesthesia Post Note  Patient: Patrick Butler  Procedure(s) Performed: Procedure(s) (LRB): ARTHROSCOPY KNEE (Left)  Patient location during evaluation: PACU Anesthesia Type: General Level of consciousness: awake and alert Pain management: pain level controlled Vital Signs Assessment: post-procedure vital signs reviewed and stable Respiratory status: spontaneous breathing, nonlabored ventilation and respiratory function stable Cardiovascular status: blood pressure returned to baseline and stable Postop Assessment: no signs of nausea or vomiting Anesthetic complications: no    Last Vitals:  Filed Vitals:   10/28/15 1430 10/28/15 1435  BP:    Pulse: 75 77  Temp: 36.7 C   Resp: 14 13    Last Pain: There were no vitals filed for this visit.               Sherese Heyward,W. EDMOND

## 2015-10-31 ENCOUNTER — Encounter (HOSPITAL_COMMUNITY): Payer: Self-pay | Admitting: Orthopedic Surgery

## 2015-11-03 DIAGNOSIS — Z9889 Other specified postprocedural states: Secondary | ICD-10-CM | POA: Diagnosis not present

## 2015-11-03 DIAGNOSIS — M25562 Pain in left knee: Secondary | ICD-10-CM | POA: Diagnosis not present

## 2015-11-04 DIAGNOSIS — G4733 Obstructive sleep apnea (adult) (pediatric): Secondary | ICD-10-CM | POA: Diagnosis not present

## 2015-11-10 DIAGNOSIS — M5412 Radiculopathy, cervical region: Secondary | ICD-10-CM | POA: Diagnosis not present

## 2015-11-28 DIAGNOSIS — Z9889 Other specified postprocedural states: Secondary | ICD-10-CM | POA: Diagnosis not present

## 2015-12-20 DIAGNOSIS — J0100 Acute maxillary sinusitis, unspecified: Secondary | ICD-10-CM | POA: Diagnosis not present

## 2016-01-09 DIAGNOSIS — N3090 Cystitis, unspecified without hematuria: Secondary | ICD-10-CM | POA: Diagnosis not present

## 2016-01-09 DIAGNOSIS — R319 Hematuria, unspecified: Secondary | ICD-10-CM | POA: Diagnosis not present

## 2016-01-09 DIAGNOSIS — Z23 Encounter for immunization: Secondary | ICD-10-CM | POA: Diagnosis not present

## 2016-01-12 NOTE — Patient Instructions (Addendum)
  Test(s) ordered today. Your results will be released to [place] (or called to you) after review, usually within 72hours after test completion. If any changes need to be made, you will be notified at that same time.  All other Health Maintenance issues reviewed.   All recommended immunizations and age-appropriate screenings are up-to-date or discussed.  No immunizations administered today.   Medications reviewed and updated.  Changes include a cream for your rash.    Your prescription(s) have been submitted to your pharmacy. Please take as directed and contact our office if you believe you are having problem(s) with the medication(s).  A referral was ordered for urology.   Please followup in 6 months

## 2016-01-12 NOTE — Progress Notes (Signed)
Subjective:    Patient ID: Patrick Butler, male    DOB: [birth year], 55 y.o.   MRN: [id number]  HPI The patient is here for follow up.  Diabetes: He is taking his medication daily as prescribed. He is compliant with a diabetic diet. He is more active since having knee surgery, but it not exercising regularly. He monitors his sugars and they have been running 119-130, rarely up to 150.  He denies hypoglycemia. He checks his feet daily and denies foot lesions. He is up-to-date with an ophthalmology examination.   Hypertension: He is taking his medication daily. He is compliant with a low sodium diet.  He has mild edema at the end of day.  He denies chest pain, palpitations, shortness of breath and regular headaches. He is active, but not exercising regularly.  He does not monitor his blood pressure at home.    Hyperlipidemia: He is taking his medication daily. He is compliant with a low fat/cholesterol diet. He is active, but not exercising regularly.   Hypothyroidism:  He is taking his medication daily.  He denies any recent changes in energy or weight that are unexplained.   GERD:  He is taking his medication daily as prescribed.  He denies any GERD symptoms and feels his GERD is well controlled.   Rash:  He has a mild rash on his face near his chin.  It gets slightly red and dry.  The skin sometimes sloughs off.  He has tried putting an anti-fungal cream on it.  He gets a similar rash near his ears and eyebrows.    Medications and allergies reviewed with patient and updated if appropriate.  Patient Active Problem List   Diagnosis Date Noted  . Acute medial meniscus tear of left knee 10/28/2015  . History of colonic polyps   . History of cancer tonsil 07/13/2015  . Erectile dysfunction 07/13/2015  . Type 2 diabetes mellitus ([place]) 01/12/2014  . Severe obesity (BMI >= 40) ([place]) 10/08/2013  . Lower urinary tract symptoms 10/08/2013  . Nonspecific abnormal electrocardiogram (ECG) (EKG)  09/22/2012  . OSA (obstructive sleep apnea) 08/08/2011  . Lumbar disc prolapse with compression radiculopathy 05/24/2011  . GERD 01/18/2009  . HYPERPLASIA PROSTATE UNS W/O UR OBST & OTH LUTS 01/18/2009  . Hypothyroidism 03/23/2008  . Hyperlipidemia 03/23/2008  . Essential hypertension 03/23/2008    Current Outpatient Prescriptions on File Prior to Visit  Medication Sig Dispense Refill  . amLODipine-benazepril (LOTREL) 5-40 MG capsule TAKE 1 CAPSULE BY MOUTH DAILY 90 capsule 3  . aspirin EC 81 MG tablet Take 81 mg by mouth daily.    Marland Kitchen atenolol (TENORMIN) 50 MG tablet Take 1 tablet (50 mg total) by mouth daily. 90 tablet 3  . calcium carbonate (OS-CAL) 600 MG TABS Take 600 mg by mouth daily.      . cetirizine (ZYRTEC) 10 MG tablet Take 10 mg by mouth daily.    . Cyanocobalamin (B-12 DOTS) 500 MCG SUBL Place 500 mcg under the tongue daily.     . ferrous gluconate (FERGON) 325 MG tablet Take 325 mg by mouth daily before breakfast. Reported on 08/23/2015    . fluticasone (FLONASE) 50 MCG/ACT nasal spray Place 2 sprays into the nose as directed. At bedtime (Patient taking differently: Place 2 sprays into the nose at bedtime. ) 16 g 5  . levothyroxine (SYNTHROID, LEVOTHROID) 175 MCG tablet Take 1 tablet (175 mcg total) by mouth daily. Two days of the week take two pills (  Patient taking differently: Take 175-350 mcg by mouth daily before breakfast. Take one tablet everyday except for on Tuesday and Friday. Take two tablets on those two days.) 94 tablet 1  . LORazepam (ATIVAN) 0.5 MG tablet Take 1 tablet (0.5 mg total) by mouth daily as needed for anxiety. 30 tablet 0  . metFORMIN (GLUCOPHAGE) 500 MG tablet TAKE 1 TABLET BY MOUTH TWICE DAILY WITH MEALS 180 tablet 2  . Multiple Vitamins-Minerals (MULTIVITAMIN WITH MINERALS) tablet Take 1 tablet by mouth daily.     Marland Kitchen OVER THE COUNTER MEDICATION Take 15 mLs by mouth daily. Glucosamine & Chondroitin Liquid    . pantoprazole (PROTONIX) 40 MG tablet TAKE 1  TABLET BY MOUTH DAILY FOR REFLUX 90 tablet 2  . pravastatin (PRAVACHOL) 40 MG tablet Take 1 tablet (40 mg total) by mouth daily. 90 tablet 1  . pregabalin (LYRICA) 75 MG capsule Take 75 mg by mouth 2 (two) times daily.     No current facility-administered medications on file prior to visit.     Past Medical History:  Diagnosis Date  . Anxiety   . Arthritis    right knee  . Cancer ([place]) 2005   tonsils/Chemo and radiation  . Dysphagia 08/2014  . Full dentures   . GERD (gastroesophageal reflux disease)   . History of chemotherapy   . History of radiation therapy    neck  . Hyperlipidemia   . Hypertension    states under control with med., has been on med. ~ 5 yr.  . Hypothyroidism   . Non-insulin dependent type 2 diabetes mellitus ([place])   . Posterior neck pain   . Seasonal allergies   . Sleep apnea    uses CPAP nightly    Past Surgical History:  Procedure Laterality Date  . COLONOSCOPY  2012  . COLONOSCOPY WITH PROPOFOL N/A 10/04/2015   Procedure: COLONOSCOPY WITH PROPOFOL;  Surgeon: Doran Stabler, MD;  Location: [facility];  Service: Gastroenterology;  Laterality: N/A;  . DIRECT LARYNGOSCOPY N/A 08/20/2014   Procedure: DIRECT LARYNGOSCOPY;  Surgeon: Rozetta Nunnery, MD;  Location: [facility];  Service: ENT;  Laterality: N/A;  . ESOPHAGOSCOPY WITH DILITATION N/A 08/20/2014   Procedure: ESOPHAGOSCOPY WITH DILATATION;  Surgeon: Rozetta Nunnery, MD;  Location: [facility];  Service: ENT;  Laterality: N/A;  . KNEE ARTHROSCOPY Right    x 3  . KNEE ARTHROSCOPY Left 10/28/2015   Procedure: ARTHROSCOPY KNEE;  Surgeon: Dorna Leitz, MD;  Location: [facility];  Service: Orthopedics;  Laterality: Left;  left knee partial medial meniscectomy and patellofemoral chondroplasty  . LAPAROSCOPIC GASTRIC BYPASS  2004  . LUMBAR LAMINECTOMY/DECOMPRESSION MICRODISCECTOMY  05/24/2011   Procedure: LUMBAR LAMINECTOMY/DECOMPRESSION MICRODISCECTOMY;  Surgeon: Winfield Cunas, MD;  Location: [facility];  Service: Neurosurgery;  Laterality: Right;  RIGHT L5-S1 Laminectomy, discectomy  . LYMPHADENECTOMY Right    cervical  . MULTIPLE TOOTH EXTRACTIONS    . PORT-A-CATH REMOVAL    . PORTACATH PLACEMENT    . REPLACEMENT TOTAL KNEE Right 03/22/2004  . SHOULDER ARTHROSCOPY Left   . SHOULDER OPEN ROTATOR CUFF REPAIR Right     Social History   Social History  . Marital status: Married    Spouse name: [name]  . Number of children: N/A  . 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 Smoking status: Former Smoker    Packs/day: 0.00  Years: 25.00    Quit date: 10/25/2002  . Smokeless tobacco: Never Used  . Alcohol use 1.8 - 2.4 oz/week    3 - 4 Standard drinks or equivalent per week  . Drug use: No  . Sexual activity: Yes   Other Topics Concern  . None   Social History Narrative  . None    Family History  Problem Relation Age of Onset  . Colon cancer Paternal Uncle   . Heart attack Maternal Uncle      X 2; both pre 55  . Hypertension Mother   . Breast cancer Mother   . Brain cancer Paternal Uncle   . Diabetes Maternal Grandfather   . Hypertension Maternal Grandfather     ? metastatic; ? primary  . Diabetes Father   . Stroke Father 62  . Coronary artery disease Mother     Review of Systems  Constitutional: Negative for chills and fever.  Respiratory: Negative for cough, shortness of breath and wheezing.   Cardiovascular: Positive for leg swelling. Negative for chest pain and palpitations.  Gastrointestinal: Negative for abdominal pain and nausea.       Gerd controlled  Genitourinary: Positive for difficulty urinating and frequency. Negative for dysuria and hematuria.  Neurological: Negative for light-headedness and headaches.       Objective:   Vitals:   01/13/16 0828  BP: 138/88  Pulse: 83  Resp: 18  Temp: 98 F (36.7 C)   Filed Weights    01/13/16 0828  Weight: (!) 369 lb (167.4 kg)   Body mass index is 50.75 kg/m.   Physical Exam    Constitutional: Appears well-developed and well-nourished. No distress.  HENT:  Head: Normocephalic and atraumatic.  Neck: Neck supple. No tracheal deviation present. No thyromegaly present.  Cardiovascular: Normal rate, regular rhythm and normal heart sounds.   No murmur heard. No carotid bruit  Pulmonary/Chest: Effort normal and breath sounds normal. No respiratory distress. No has no wheezes. No rales.  Musculoskeletal: No edema.  Lymphadenopathy: No cervical adenopathy.  Skin: erythematous patch near chin and side Dorla Guizar - possible folliculitis.  Skin is warm and dry. Not diaphoretic.  Psychiatric: Normal mood and affect. Behavior is normal.     Assessment & Plan:   Flu vaccine today  See Problem List for Assessment and Plan of chronic medical problems.    F/u in 6 months

## 2016-01-13 ENCOUNTER — Encounter: Payer: Self-pay | Admitting: Internal Medicine

## 2016-01-13 ENCOUNTER — Ambulatory Visit (INDEPENDENT_AMBULATORY_CARE_PROVIDER_SITE_OTHER): Payer: BLUE CROSS/BLUE SHIELD | Admitting: Internal Medicine

## 2016-01-13 ENCOUNTER — Other Ambulatory Visit (INDEPENDENT_AMBULATORY_CARE_PROVIDER_SITE_OTHER): Payer: BLUE CROSS/BLUE SHIELD

## 2016-01-13 VITALS — BP 138/88 | HR 83 | Temp 98.0°F | Resp 18 | Wt 369.0 lb

## 2016-01-13 DIAGNOSIS — I10 Essential (primary) hypertension: Secondary | ICD-10-CM

## 2016-01-13 DIAGNOSIS — R3912 Poor urinary stream: Secondary | ICD-10-CM

## 2016-01-13 DIAGNOSIS — E039 Hypothyroidism, unspecified: Secondary | ICD-10-CM

## 2016-01-13 DIAGNOSIS — K219 Gastro-esophageal reflux disease without esophagitis: Secondary | ICD-10-CM

## 2016-01-13 DIAGNOSIS — R350 Frequency of micturition: Secondary | ICD-10-CM

## 2016-01-13 DIAGNOSIS — E1141 Type 2 diabetes mellitus with diabetic mononeuropathy: Secondary | ICD-10-CM | POA: Diagnosis not present

## 2016-01-13 DIAGNOSIS — R21 Rash and other nonspecific skin eruption: Secondary | ICD-10-CM

## 2016-01-13 DIAGNOSIS — E785 Hyperlipidemia, unspecified: Secondary | ICD-10-CM | POA: Diagnosis not present

## 2016-01-13 LAB — LIPID PANEL
CHOL/HDL RATIO: 5
Cholesterol: 149 mg/dL (ref 0–200)
HDL: 31.4 mg/dL — AB (ref >39.00)
LDL CALC: 83 mg/dL (ref 0–99)
NONHDL: 117.44
TRIGLYCERIDES: 173 mg/dL — AB (ref 0.0–149.0)
VLDL: 34.6 mg/dL (ref 0.0–40.0)

## 2016-01-13 LAB — TSH: TSH: 0.76 u[IU]/mL (ref 0.35–4.50)

## 2016-01-13 LAB — COMPREHENSIVE METABOLIC PANEL
ALBUMIN: 4 g/dL (ref 3.5–5.2)
ALT: 20 U/L (ref 0–53)
AST: 19 U/L (ref 0–37)
Alkaline Phosphatase: 62 U/L (ref 39–117)
BUN: 20 mg/dL (ref 6–23)
CALCIUM: 9.4 mg/dL (ref 8.4–10.5)
CHLORIDE: 101 meq/L (ref 96–112)
CO2: 30 mEq/L (ref 19–32)
Creatinine, Ser: 1.23 mg/dL (ref 0.40–1.50)
GFR: 64.41 mL/min (ref >60.00)
Glucose, Bld: 142 mg/dL — ABNORMAL HIGH (ref 70–99)
POTASSIUM: 4.8 meq/L (ref 3.5–5.1)
SODIUM: 137 meq/L (ref 135–145)
Total Bilirubin: 0.5 mg/dL (ref 0.2–1.2)
Total Protein: 7.8 g/dL (ref 6.0–8.3)

## 2016-01-13 LAB — MICROALBUMIN / CREATININE URINE RATIO
Creatinine,U: 165.6 mg/dL
MICROALB UR: 1.2 mg/dL (ref 0.0–1.9)
MICROALB/CREAT RATIO: 0.7 mg/g (ref 0.0–30.0)

## 2016-01-13 LAB — HEMOGLOBIN A1C: Hgb A1c MFr Bld: 6.7 % — ABNORMAL HIGH (ref 4.6–6.5)

## 2016-01-13 MED ORDER — TRIAMCINOLONE ACETONIDE 0.025 % EX OINT: 1.0000 "application " | TOPICAL_OINTMENT | Freq: Two times a day (BID) | CUTANEOUS | 0 refills | Status: DC

## 2016-01-13 NOTE — Assessment & Plan Note (Signed)
S/p arthroscopic surgery

## 2016-01-13 NOTE — Assessment & Plan Note (Signed)
Sugars controlled at home Check a1c, urine micro Work on weight loss - stay active and decrease intake

## 2016-01-13 NOTE — Assessment & Plan Note (Signed)
Refer to urology psa low normal just less than one year

## 2016-01-13 NOTE — Assessment & Plan Note (Signed)
GERD controlled Continue daily medication  

## 2016-01-13 NOTE — Assessment & Plan Note (Signed)
BP well controlled Current regimen effective and well tolerated Continue current medications at current doses cmp  

## 2016-01-13 NOTE — Assessment & Plan Note (Signed)
Check tsh  Titrate med dose if needed  

## 2016-01-13 NOTE — Assessment & Plan Note (Signed)
Has been more active and has lost some weight Encouraged to continue increased activity Decrease intake

## 2016-01-13 NOTE — Assessment & Plan Note (Signed)
Check lipids. Continue statin.

## 2016-01-13 NOTE — Progress Notes (Signed)
Pre visit review using our clinic review tool, if applicable. No additional management support is needed unless otherwise documented below in the visit note. 

## 2016-01-13 NOTE — Assessment & Plan Note (Addendum)
On face near chin, sometimes near eyebrows or near ears possible folliculitis Trial of triamcinolone cream If not effective will refer to derm

## 2016-01-15 ENCOUNTER — Encounter: Payer: Self-pay | Admitting: Internal Medicine

## 2016-01-18 ENCOUNTER — Encounter: Payer: Self-pay | Admitting: Internal Medicine

## 2016-01-30 ENCOUNTER — Other Ambulatory Visit: Payer: Self-pay | Admitting: Internal Medicine

## 2016-02-02 ENCOUNTER — Other Ambulatory Visit: Payer: Self-pay | Admitting: Emergency Medicine

## 2016-02-02 MED ORDER — LORAZEPAM 0.5 MG PO TABS: 0.5000 mg | ORAL_TABLET | Freq: Every day | ORAL | 0 refills | Status: DC | PRN

## 2016-02-17 DIAGNOSIS — G4733 Obstructive sleep apnea (adult) (pediatric): Secondary | ICD-10-CM | POA: Diagnosis not present

## 2016-03-06 DIAGNOSIS — R3915 Urgency of urination: Secondary | ICD-10-CM | POA: Diagnosis not present

## 2016-03-06 DIAGNOSIS — R350 Frequency of micturition: Secondary | ICD-10-CM | POA: Diagnosis not present

## 2016-03-06 DIAGNOSIS — N5201 Erectile dysfunction due to arterial insufficiency: Secondary | ICD-10-CM | POA: Diagnosis not present

## 2016-03-12 ENCOUNTER — Other Ambulatory Visit: Payer: Self-pay | Admitting: Internal Medicine

## 2016-03-12 NOTE — Telephone Encounter (Signed)
Please advise on sig, refill request is different from med list.

## 2016-03-13 MED ORDER — LEVOTHYROXINE SODIUM 175 MCG PO TABS: 175.0000 ug | ORAL_TABLET | Freq: Every day | ORAL | Status: DC

## 2016-03-16 ENCOUNTER — Other Ambulatory Visit: Payer: Self-pay | Admitting: Internal Medicine

## 2016-03-19 ENCOUNTER — Other Ambulatory Visit: Payer: Self-pay | Admitting: Internal Medicine

## 2016-03-19 MED ORDER — LEVOTHYROXINE SODIUM 175 MCG PO TABS: 175.0000 ug | ORAL_TABLET | Freq: Every day | ORAL | 3 refills | Status: DC

## 2016-04-03 DIAGNOSIS — M79671 Pain in right foot: Secondary | ICD-10-CM | POA: Diagnosis not present

## 2016-04-03 DIAGNOSIS — M67912 Unspecified disorder of synovium and tendon, left shoulder: Secondary | ICD-10-CM | POA: Diagnosis not present

## 2016-04-19 ENCOUNTER — Telehealth: Payer: Self-pay | Admitting: Pulmonary Disease

## 2016-04-19 NOTE — Telephone Encounter (Signed)
CPAP card downloaded and placed in Dr Bari Mantis look at. 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 Aware that Dr Elsworth Soho will not be in the office until Dec 18th--pt okay with this.  Will send to Dr Elsworth Soho as Juluis Rainier.

## 2016-04-23 NOTE — Telephone Encounter (Signed)
Download reviewed on CPAP 16 cm for last 3 months Shows good compliance and good control of events Should be able to clear him from duty physical standpoint

## 2016-04-23 NOTE — Telephone Encounter (Signed)
Spoke with pt. He is aware of download results. Nothing further was needed.

## 2016-05-01 ENCOUNTER — Other Ambulatory Visit: Payer: Self-pay | Admitting: Internal Medicine

## 2016-05-01 DIAGNOSIS — M67912 Unspecified disorder of synovium and tendon, left shoulder: Secondary | ICD-10-CM | POA: Diagnosis not present

## 2016-05-01 DIAGNOSIS — M79671 Pain in right foot: Secondary | ICD-10-CM | POA: Diagnosis not present

## 2016-05-04 ENCOUNTER — Encounter: Payer: Self-pay | Admitting: Internal Medicine

## 2016-05-24 ENCOUNTER — Other Ambulatory Visit: Payer: Self-pay | Admitting: Internal Medicine

## 2016-05-25 ENCOUNTER — Ambulatory Visit: Payer: BLUE CROSS/BLUE SHIELD | Admitting: Pulmonary Disease

## 2016-06-03 ENCOUNTER — Encounter: Payer: Self-pay | Admitting: Pulmonary Disease

## 2016-06-04 ENCOUNTER — Encounter: Payer: Self-pay | Admitting: Pulmonary Disease

## 2016-06-04 ENCOUNTER — Ambulatory Visit (INDEPENDENT_AMBULATORY_CARE_PROVIDER_SITE_OTHER): Payer: BLUE CROSS/BLUE SHIELD | Admitting: Pulmonary Disease

## 2016-06-04 DIAGNOSIS — G4733 Obstructive sleep apnea (adult) (pediatric): Secondary | ICD-10-CM

## 2016-06-04 DIAGNOSIS — I10 Essential (primary) hypertension: Secondary | ICD-10-CM | POA: Diagnosis not present

## 2016-06-04 NOTE — Patient Instructions (Signed)
Prescription for new CPAP 16 cm will be sent to DME   CPAP supplies will be renewed for a year  Call as needed when you're due for your next due to physical

## 2016-06-04 NOTE — Addendum Note (Signed)
Addended by: Valerie Salts on: 06/04/2016 09:33 AM   Modules accepted: Orders

## 2016-06-04 NOTE — Assessment & Plan Note (Signed)
Prescription for new CPAP 16 cm will be sent to DME   CPAP supplies will be renewed for a year  Call as needed when you're due for your next due to physical  Weight loss encouraged, compliance with goal of at least 4-6 hrs every night is the expectation. Advised against medications with sedative side effects Cautioned against driving when sleepy - understanding that sleepiness will vary on a day to day basis

## 2016-06-04 NOTE — Assessment & Plan Note (Signed)
Well controlled Clearance was given for his due to physical in December based on his download for compliance with CPAP and well-controlled blood pressure

## 2016-06-04 NOTE — Progress Notes (Signed)
   Subjective:    Patient ID: Patrick Butler, male    DOB: [birth year], 58 y.o.   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 He is on CPAP 16 cm, nasal   06/04/2016  Annual follow-up He feels well, CPAP is working well except the humidity where he seems to use up all the water some nights and other nights, no water at all Pressure is okay and he reports good compliance This was confirmed by his download today which shows excellent usage more than 8 hours every night on 16 cm with no residuals  04/2016 Download reviewed on CPAP 16 cm for last 3 months Shows good compliance and good control of events He was given clearance from a DOT physical standpoint  His blood pressure is well controlled His weight is unchanged, He would like to use a new machine due to humidifier issues and noise with this current one      NPSG 08/2011:  AHI 21/hr, nasal cpap titrated to 16cm  .  Review of Systems neg for any significant sore throat, dysphagia, itching, sneezing, nasal congestion or excess/ purulent secretions, fever, chills, sweats, unintended wt loss, pleuritic or exertional cp, hempoptysis, orthopnea pnd or change in chronic leg swelling. Also denies presyncope, palpitations, heartburn, abdominal pain, nausea, vomiting, diarrhea or change in bowel or urinary habits, dysuria,hematuria, rash, arthralgias, visual complaints, headache, numbness weakness or ataxia.     Objective:   Physical Exam   Gen. Pleasant, obese, in no distress ENT - no lesions, no post nasal drip Neck: No JVD, no thyromegaly, no carotid bruits Lungs: no use of accessory muscles, no dullness to percussion, decreased without rales or rhonchi  Cardiovascular: Rhythm regular, heart sounds  normal, no murmurs or gallops, no peripheral edema Musculoskeletal: No deformities, no cyanosis or clubbing , no tremors        Assessment & Plan:

## 2016-07-02 ENCOUNTER — Other Ambulatory Visit: Payer: Self-pay | Admitting: Internal Medicine

## 2016-07-11 NOTE — Progress Notes (Signed)
Subjective:    Patient ID: Patrick Butler, male    DOB: [birth year], 58 y.o.   MRN: [id number]  HPI The patient is here for follow up.  Diabetes: He is taking his medication daily as prescribed. He is compliant with a diabetic diet. He is not exercising regularly. Marland Kitchen He checks his feet daily and denies foot lesions. He is up-to-date with an ophthalmology examination.   Hypertension: He is taking his medication daily.   He denies chest pain, palpitations, edema, shortness of breath and regular headaches. He is not exercising regularly.  He does monitor his blood pressure at home - it is elevated about 50% of the time.    Hypothyroidism:  He is taking his medication daily.  He denies any changes in energy or weight that are unexplained.   Hyperlipidemia: He is taking his medication daily. He is fairly compliant with a low fat/cholesterol diet. He is not exercising regularly. He denies myalgias.   GERD:  He is taking his medication daily as prescribed.  He denies any GERD symptoms and feels his GERD is well controlled.   Skin abnormalities:  He has a couple of abnormalities on his head and face.  They are slighlty red and do not go away.   Medications and allergies reviewed with patient and updated if appropriate.  Patient Active Problem List   Diagnosis Date Noted  . Rash and nonspecific skin eruption 01/13/2016  . Urinary frequency 01/13/2016  . Acute medial meniscus tear of left knee 10/28/2015  . History of colonic polyps   . History of cancer tonsil 07/13/2015  . Erectile dysfunction 07/13/2015  . Type 2 diabetes mellitus ([place]) 01/12/2014  . Severe obesity (BMI >= 40) ([place]) 10/08/2013  . Lower urinary tract symptoms 10/08/2013  . Nonspecific abnormal electrocardiogram (ECG) (EKG) 09/22/2012  . OSA (obstructive sleep apnea) 08/08/2011  . Lumbar disc prolapse with compression radiculopathy 05/24/2011  . GERD 01/18/2009  . HYPERPLASIA PROSTATE UNS W/O UR OBST & OTH LUTS 01/18/2009   . Hypothyroidism 03/23/2008  . Hyperlipidemia 03/23/2008  . Essential hypertension 03/23/2008    Current Outpatient Prescriptions on File Prior to Visit  Medication Sig Dispense Refill  . amLODipine-benazepril (LOTREL) 5-40 MG capsule TAKE 1 CAPSULE BY MOUTH DAILY 90 capsule 3  . aspirin EC 81 MG tablet Take 81 mg by mouth daily.    Marland Kitchen atenolol (TENORMIN) 50 MG tablet TAKE 1 TABLET BY MOUTH DAILY 90 tablet 0  . calcium carbonate (OS-CAL) 600 MG TABS Take 600 mg by mouth daily.      . cetirizine (ZYRTEC) 10 MG tablet Take 10 mg by mouth daily.    . Cyanocobalamin (B-12 DOTS) 500 MCG SUBL Place 500 mcg under the tongue daily.     . DULoxetine (CYMBALTA) 20 MG capsule Take 30 mg by mouth daily.     . ferrous gluconate (FERGON) 325 MG tablet Take 325 mg by mouth daily before breakfast. Reported on 08/23/2015    . fluticasone (FLONASE) 50 MCG/ACT nasal spray Place 2 sprays into the nose as directed. At bedtime (Patient taking differently: Place 2 sprays into the nose at bedtime. ) 16 g 5  . levothyroxine (SYNTHROID, LEVOTHROID) 175 MCG tablet Take 1-2 tablets (175-350 mcg total) by mouth daily before breakfast. 1 tab QD except for Tuesday and Friday. 2 tabs on those days. 114 tablet 3  . LORazepam (ATIVAN) 0.5 MG tablet Take 1 tablet (0.5 mg total) by mouth daily as needed for anxiety. 30 tablet  0  . metFORMIN (GLUCOPHAGE) 500 MG tablet TAKE 1 TABLET BY MOUTH TWICE DAILY WITH MEALS 180 tablet 0  . Multiple Vitamins-Minerals (MULTIVITAMIN WITH MINERALS) tablet Take 1 tablet by mouth daily.     Marland Kitchen OVER THE COUNTER MEDICATION Take 15 mLs by mouth daily. Glucosamine & Chondroitin Liquid    . pantoprazole (PROTONIX) 40 MG tablet TAKE ONE (1) TABLET BY MOUTH EVERY DAY FOR REFLUX 90 tablet 0  . pravastatin (PRAVACHOL) 40 MG tablet TAKE 1 TABLET BY MOUTH DAILY 90 tablet 1  . triamcinolone (KENALOG) 0.025 % ointment Apply 1 application topically 2 (two) times daily. Do not use from more than two weeks at a  time 30 g 0   No current facility-administered medications on file prior to visit.     Past Medical History:  Diagnosis Date  . Anxiety   . Arthritis    right knee  . Cancer ([place]) 2005   tonsils/Chemo and radiation  . Dysphagia 08/2014  . Full dentures   . GERD (gastroesophageal reflux disease)   . History of chemotherapy   . History of radiation therapy    neck  . Hyperlipidemia   . Hypertension    states under control with med., has been on med. ~ 5 yr.  . Hypothyroidism   . Non-insulin dependent type 2 diabetes mellitus ([place])   . Posterior neck pain   . Seasonal allergies   . Sleep apnea    uses CPAP nightly    Past Surgical History:  Procedure Laterality Date  . COLONOSCOPY  2012  . COLONOSCOPY WITH PROPOFOL N/A 10/04/2015   Procedure: COLONOSCOPY WITH PROPOFOL;  Surgeon: Doran Stabler, MD;  [facility]: [facility];  Service: Gastroenterology;  Laterality: N/A;  . DIRECT LARYNGOSCOPY N/A 08/20/2014   Procedure: DIRECT LARYNGOSCOPY;  Surgeon: Rozetta Nunnery, MD;  Location: [facility];  Service: ENT;  Laterality: N/A;  . ESOPHAGOSCOPY WITH DILITATION N/A 08/20/2014   Procedure: ESOPHAGOSCOPY WITH DILATATION;  Surgeon: Rozetta Nunnery, MD;  Location: [facility];  Service: ENT;  Laterality: N/A;  . KNEE ARTHROSCOPY Right    x 3  . KNEE ARTHROSCOPY Left 10/28/2015   Procedure: ARTHROSCOPY KNEE;  Surgeon: Dorna Leitz, MD;  Location: [facility];  Service: Orthopedics;  Laterality: Left;  left knee partial medial meniscectomy and patellofemoral chondroplasty  . LAPAROSCOPIC GASTRIC BYPASS  2004  . LUMBAR LAMINECTOMY/DECOMPRESSION MICRODISCECTOMY  05/24/2011   Procedure: LUMBAR LAMINECTOMY/DECOMPRESSION MICRODISCECTOMY;  Surgeon: Winfield Cunas, MD;  Location: [facility];  Service: Neurosurgery;  Laterality: Right;  RIGHT L5-S1 Laminectomy, discectomy  . LYMPHADENECTOMY Right    cervical  . MULTIPLE TOOTH EXTRACTIONS    . PORT-A-CATH  REMOVAL    . PORTACATH PLACEMENT    . REPLACEMENT TOTAL KNEE Right 03/22/2004  . SHOULDER ARTHROSCOPY Left   . SHOULDER OPEN ROTATOR CUFF REPAIR Right     Social History   Social History  . Marital status: Married    Spouse name: [name]  . Number of children: N/A  . 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 Smoking status: Former Smoker    Packs/day: 0.00    Years: 25.00    Quit date: 10/25/2002  . Smokeless tobacco: Never Used  . Alcohol use 1.8 - 2.4 oz/week    3 - 4 Standard drinks or equivalent per week  . Drug use: No  . Sexual  activity: Yes   Other Topics Concern  . Not on file   Social History Narrative  . No narrative on file    Family History  Problem Relation Age of Onset  . Colon cancer Paternal Uncle   . Heart attack Maternal Uncle      X 2; both pre 55  . Hypertension Mother   . Breast cancer Mother   . Brain cancer Paternal Uncle   . Diabetes Maternal Grandfather   . Hypertension Maternal Grandfather     ? metastatic; ? primary  . Diabetes Father   . Stroke Father 25  . Coronary artery disease Mother     Review of Systems  Constitutional: Negative for chills, fatigue and fever.  Respiratory: Negative for cough, shortness of breath and wheezing.   Cardiovascular: Negative for chest pain, palpitations and leg swelling.  Musculoskeletal: Negative for myalgias.  Neurological: Negative for light-headedness and headaches.       Objective:   Vitals:   07/12/16 0821 07/12/16 0825  BP: (!) 144/92 (!) 162/96  Pulse: 90   Resp: 16   Temp: 98.1 F (36.7 C)    Wt Readings from Last 3 Encounters:  07/12/16 (!) 373 lb (169.2 kg)  06/04/16 (!) 350 lb (158.8 kg)  01/13/16 (!) 369 lb (167.4 kg)   Body mass index is 51.3 kg/m.   Physical Exam    Constitutional: Appears well-developed and well-nourished. No distress.  HENT:  Head: Normocephalic and atraumatic.  Neck:  Neck supple. No tracheal deviation present. No thyromegaly present.  No cervical lymphadenopathy Cardiovascular: Normal rate, regular rhythm and normal heart sounds.   No murmur heard. No carotid bruit .  No edema Pulmonary/Chest: Effort normal and breath sounds normal. No respiratory distress. No has no wheezes. No rales.  Skin: Skin is warm and dry. Not diaphoretic.  2 areas of redness on top of head that is slightly dry/scab like lesion - 3 mm in size Psychiatric: Normal mood and affect. Behavior is normal.      Assessment & Plan:   Advised to see derm to have skin check / area on top of head  See Problem List for Assessment and Plan of chronic medical problems.

## 2016-07-11 NOTE — Patient Instructions (Addendum)
  Test(s) ordered today. Your results will be released to [facility] (or called to you) after review, usually within 72hours after test completion. If any changes need to be made, you will be notified at that same time.   Medications reviewed and updated.  Changes include increasing atenolol to 100 mg daily.   Your prescription(s) have been submitted to your pharmacy. Please take as directed and contact our office if you believe you are having problem(s) with the medication(s).    Please followup in 6 months for a physical

## 2016-07-12 ENCOUNTER — Other Ambulatory Visit (INDEPENDENT_AMBULATORY_CARE_PROVIDER_SITE_OTHER): Payer: BLUE CROSS/BLUE SHIELD

## 2016-07-12 ENCOUNTER — Ambulatory Visit (INDEPENDENT_AMBULATORY_CARE_PROVIDER_SITE_OTHER): Payer: BLUE CROSS/BLUE SHIELD | Admitting: Internal Medicine

## 2016-07-12 ENCOUNTER — Encounter: Payer: Self-pay | Admitting: Internal Medicine

## 2016-07-12 VITALS — BP 162/96 | HR 90 | Temp 98.1°F | Resp 16 | Wt 373.0 lb

## 2016-07-12 DIAGNOSIS — E7800 Pure hypercholesterolemia, unspecified: Secondary | ICD-10-CM

## 2016-07-12 DIAGNOSIS — E1141 Type 2 diabetes mellitus with diabetic mononeuropathy: Secondary | ICD-10-CM

## 2016-07-12 DIAGNOSIS — I10 Essential (primary) hypertension: Secondary | ICD-10-CM | POA: Diagnosis not present

## 2016-07-12 DIAGNOSIS — F419 Anxiety disorder, unspecified: Secondary | ICD-10-CM | POA: Diagnosis not present

## 2016-07-12 DIAGNOSIS — Z23 Encounter for immunization: Secondary | ICD-10-CM

## 2016-07-12 DIAGNOSIS — E039 Hypothyroidism, unspecified: Secondary | ICD-10-CM

## 2016-07-12 DIAGNOSIS — K219 Gastro-esophageal reflux disease without esophagitis: Secondary | ICD-10-CM

## 2016-07-12 LAB — LIPID PANEL
CHOLESTEROL: 138 mg/dL (ref 0–200)
HDL: 33.4 mg/dL — AB (ref >39.00)
LDL CALC: 71 mg/dL (ref 0–99)
NonHDL: 104.67
TRIGLYCERIDES: 170 mg/dL — AB (ref 0.0–149.0)
Total CHOL/HDL Ratio: 4
VLDL: 34 mg/dL (ref 0.0–40.0)

## 2016-07-12 LAB — COMPREHENSIVE METABOLIC PANEL
ALBUMIN: 3.9 g/dL (ref 3.5–5.2)
ALT: 20 U/L (ref 0–53)
AST: 15 U/L (ref 0–37)
Alkaline Phosphatase: 68 U/L (ref 39–117)
BUN: 19 mg/dL (ref 6–23)
CHLORIDE: 102 meq/L (ref 96–112)
CO2: 28 mEq/L (ref 19–32)
CREATININE: 1.1 mg/dL (ref 0.40–1.50)
Calcium: 9.8 mg/dL (ref 8.4–10.5)
GFR: 73.15 mL/min (ref >60.00)
Glucose, Bld: 135 mg/dL — ABNORMAL HIGH (ref 70–99)
Potassium: 4.7 mEq/L (ref 3.5–5.1)
SODIUM: 136 meq/L (ref 135–145)
Total Bilirubin: 0.5 mg/dL (ref 0.2–1.2)
Total Protein: 7.5 g/dL (ref 6.0–8.3)

## 2016-07-12 LAB — HEMOGLOBIN A1C: Hgb A1c MFr Bld: 7 % — ABNORMAL HIGH (ref 4.6–6.5)

## 2016-07-12 LAB — TSH: TSH: 0.06 u[IU]/mL — ABNORMAL LOW (ref 0.35–4.50)

## 2016-07-12 MED ORDER — ATENOLOL 100 MG PO TABS
100.0000 mg | ORAL_TABLET | Freq: Every day | ORAL | 3 refills | Status: DC
Start: 2016-07-12 — End: 2017-08-15

## 2016-07-12 NOTE — Assessment & Plan Note (Signed)
Check lipid panel  Continue daily statin Regular exercise and healthy diet encouraged  

## 2016-07-12 NOTE — Assessment & Plan Note (Signed)
BP elevated 50% of the time - not controlled Increase atenolol to 100 mg daily Continue lotrel at current dose Blood work today

## 2016-07-12 NOTE — Assessment & Plan Note (Signed)
Check a1c Low sugar / carb diet Stressed regular exercise, keeping weight down/weight loss 

## 2016-07-12 NOTE — Assessment & Plan Note (Addendum)
He uses ativan only as needed which is not often Ok to continue

## 2016-07-12 NOTE — Progress Notes (Signed)
Pre visit review using our clinic review tool, if applicable. No additional management support is needed unless otherwise documented below in the visit note. 

## 2016-07-12 NOTE — Assessment & Plan Note (Signed)
Check tsh  Titrate med dose if needed  

## 2016-07-12 NOTE — Assessment & Plan Note (Signed)
GERD controlled Continue daily medication  

## 2016-07-17 ENCOUNTER — Telehealth: Payer: Self-pay | Admitting: Internal Medicine

## 2016-07-17 DIAGNOSIS — E039 Hypothyroidism, unspecified: Secondary | ICD-10-CM

## 2016-07-17 NOTE — Telephone Encounter (Signed)
Patient called back to get lab results.  Gave MD response.  Please follow up with patient in regard to Thyroid medication.

## 2016-07-17 NOTE — Telephone Encounter (Signed)
SPoke with pt to clarify. Labs have been ordered per mds request.

## 2016-07-20 DIAGNOSIS — G4733 Obstructive sleep apnea (adult) (pediatric): Secondary | ICD-10-CM | POA: Diagnosis not present

## 2016-08-01 ENCOUNTER — Other Ambulatory Visit: Payer: Self-pay | Admitting: Internal Medicine

## 2016-08-15 DIAGNOSIS — R1314 Dysphagia, pharyngoesophageal phase: Secondary | ICD-10-CM | POA: Diagnosis not present

## 2016-08-15 DIAGNOSIS — H9313 Tinnitus, bilateral: Secondary | ICD-10-CM | POA: Diagnosis not present

## 2016-08-16 NOTE — Pre-Procedure Instructions (Signed)
Spoke with Patrick Butler at Dr. Pollie Friar office; pt. will need to be done at [facility] due to BMI > 50.

## 2016-08-17 ENCOUNTER — Encounter (HOSPITAL_COMMUNITY): Payer: Self-pay | Admitting: *Deleted

## 2016-08-17 NOTE — Progress Notes (Signed)
Mr Hack Denies chest pain or light headedness. Patient reports that CBG's run around 130. I instructed patient to check CBG to check CBG and if it is less than 70 to treat it with   1/2 cup of clear juice like apple juice or cranberry juice I instructed patient to recheck CBG in 15 minutes and if CBG is not greater than 70, to  Call [phone] (pre- op). If it is before pre-op opens to retreat as before and recheck CBG in 15 minutes. I told patient to make note of time that liquid is taken and amount, that surgical time may have to be adjusted.

## 2016-08-18 ENCOUNTER — Other Ambulatory Visit: Payer: Self-pay | Admitting: Internal Medicine

## 2016-08-18 ENCOUNTER — Ambulatory Visit: Payer: Self-pay | Admitting: Otolaryngology

## 2016-08-18 NOTE — H&P (Signed)
PREOPERATIVE H&P  Chief Complaint: trouble swallowing  HPI: Patrick Butler is a 56 y.o. male who presents for evaluation of trouble swallowing with food getting stuck in the region of the UES. He's had a history of tonsil cancer treated with XRT 13 years ago. He's had previous dilation of the upper cervical esophagus 2 years ago.He's taken to the OR for repeat dilation.  Past Medical History:  Diagnosis Date  . Anxiety   . Arthritis    right knee  . Cancer ([place]) 2005   tonsils/Chemo and radiation  . Dysphagia 08/2014  . Dyspnea    with exertion  . Full dentures   . GERD (gastroesophageal reflux disease)   . History of chemotherapy   . History of radiation therapy    neck  . Hyperlipidemia   . Hypertension    states under control with med., has been on med. ~ 5 yr.  . Hypothyroidism   . Non-insulin dependent type 2 diabetes mellitus ([place])   . Posterior neck pain   . Seasonal allergies   . Sleep apnea    uses CPAP nightly   Past Surgical History:  Procedure Laterality Date  . COLONOSCOPY  2012  . COLONOSCOPY WITH PROPOFOL N/A 10/04/2015   Procedure: COLONOSCOPY WITH PROPOFOL;  Surgeon: Doran Stabler, MD;  [facility];  Service: Gastroenterology;  Laterality: N/A;  . DIRECT LARYNGOSCOPY N/A 08/20/2014   Procedure: DIRECT LARYNGOSCOPY;  Surgeon: Rozetta Nunnery, MD;  Location: [facility];  Service: ENT;  Laterality: N/A;  . ESOPHAGOSCOPY WITH DILITATION N/A 08/20/2014   Procedure: ESOPHAGOSCOPY WITH DILATATION;  Surgeon: Rozetta Nunnery, MD;  Location: [facility];  Service: ENT;  Laterality: N/A;  . KNEE ARTHROPLASTY Right   . KNEE ARTHROSCOPY Right    x 3  . KNEE ARTHROSCOPY Left 10/28/2015   Procedure: ARTHROSCOPY KNEE;  Surgeon: Dorna Leitz, MD;  Location: [facility];  Service: Orthopedics;  Laterality: Left;  left knee partial medial meniscectomy and patellofemoral chondroplasty  . LAPAROSCOPIC GASTRIC BYPASS  2004  . LUMBAR  LAMINECTOMY/DECOMPRESSION MICRODISCECTOMY  05/24/2011   Procedure: LUMBAR LAMINECTOMY/DECOMPRESSION MICRODISCECTOMY;  Surgeon: Winfield Cunas, MD;  Location: [facility];  Service: Neurosurgery;  Laterality: Right;  RIGHT L5-S1 Laminectomy, discectomy  . LYMPHADENECTOMY Right    cervical  . MULTIPLE TOOTH EXTRACTIONS    . PORT-A-CATH REMOVAL    . PORTACATH PLACEMENT    . REPLACEMENT TOTAL KNEE Right 03/22/2004  . SHOULDER ARTHROSCOPY Left   . SHOULDER OPEN ROTATOR CUFF REPAIR Right    Social History   Social History  . Marital status: Married    Spouse name: [name]  . Number of children: N/A  . 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 Smoking status: Former Smoker    Packs/day: 0.00    Years: 25.00    Quit date: 10/25/2002  . Smokeless tobacco: Never Used  . Alcohol use No     Comment: 08/17/16- " no desire in past 3- 4 months  . Drug use: No  . Sexual activity: Yes   Other Topics Concern  . None   Social History Narrative  . None   Family History  Problem Relation Age of Onset  . Colon cancer Paternal Uncle   . Heart attack Maternal Uncle      X 2; both pre 55  . Hypertension Mother   .  Breast cancer Mother   . Coronary artery disease Mother   . Brain cancer Paternal Uncle   . Diabetes Maternal Grandfather   . Hypertension Maternal Grandfather     ? metastatic; ? primary  . Diabetes Father   . Stroke Father 5   Allergies  Allergen Reactions  . Sulfa Antibiotics Rash   Prior to Admission medications   Medication Sig Start Date End Date Taking? Authorizing Provider  amLODipine-benazepril (LOTREL) 5-40 MG capsule TAKE 1 CAPSULE BY MOUTH DAILY 10/08/15  Yes Binnie Rail, MD  aspirin EC 81 MG tablet Take 81 mg by mouth every evening.    Yes Historical Provider, MD  atenolol (TENORMIN) 100 MG tablet Take 1 tablet (100 mg total) by mouth daily. 07/12/16  Yes Binnie Rail, MD  cetirizine  (ZYRTEC) 10 MG tablet Take 10 mg by mouth daily.   Yes Historical Provider, MD  Cyanocobalamin (B-12 DOTS) 500 MCG SUBL Place 500 mcg under the tongue daily.    Yes Historical Provider, MD  Doxylamine-Phenylephrine-APAP (SINUS & CONGESTION DAY/NIGHT PO) Take 2 tablets by mouth daily as needed (SINUS HEADACHE).   Yes Historical Provider, MD  fluticasone (FLONASE) 50 MCG/ACT nasal spray Place 2 sprays into the nose as directed. At bedtime Patient taking differently: Place 2 sprays into the nose at bedtime.  11/20/11  Yes Hendricks Limes, MD  levothyroxine (SYNTHROID, LEVOTHROID) 175 MCG tablet Take 1-2 tablets (175-350 mcg total) by mouth daily before breakfast. 1 tab QD except for Tuesday and Friday. 2 tabs on those days. Patient taking differently: Take 175-350 mcg by mouth daily before breakfast. 1 tab every day except for Tuesday and takes 2 tabs 03/19/16  Yes Binnie Rail, MD  LORazepam (ATIVAN) 0.5 MG tablet Take 1 tablet (0.5 mg total) by mouth daily as needed for anxiety. 02/02/16  Yes Binnie Rail, MD  metFORMIN (GLUCOPHAGE) 500 MG tablet TAKE 1 TABLET BY MOUTH TWICE DAILY WITH MEALS 05/25/16  Yes Binnie Rail, MD  Multiple Vitamins-Minerals (MULTIVITAMIN WITH MINERALS) tablet Take 1 tablet by mouth daily.    Yes Historical Provider, MD  OVER THE COUNTER MEDICATION Take 15 mLs by mouth daily. Glucosamine & Chondroitin Liquid   Yes Historical Provider, MD  pantoprazole (PROTONIX) 40 MG tablet TAKE ONE (1) TABLET BY MOUTH EVERY DAY FOR REFLUX 07/02/16  Yes Binnie Rail, MD  pravastatin (PRAVACHOL) 40 MG tablet TAKE 1 TABLET BY MOUTH DAILY Patient taking differently: TAKE 1 TABLET BY MOUTH DAILY AT NIGHT 05/01/16  Yes Binnie Rail, MD  tolterodine (DETROL LA) 4 MG 24 hr capsule Take 4 mg by mouth every evening.    Yes Historical Provider, MD  triamcinolone (KENALOG) 0.025 % ointment Apply 1 application topically 2 (two) times daily. Do not use from more than two weeks at a time Patient taking  differently: Apply 1 application topically daily as needed (RASH). Do not use from more than two weeks at a time 01/13/16  Yes Binnie Rail, MD     Positive ROS: trouble swallowing  All other systems have been reviewed and were otherwise negative with the exception of those mentioned in the HPI and as above.  Physical Exam: There were no vitals filed for this visit.  General: Alert, no acute distress Oral: Normal oral mucosa and tonsils Nasal: Clear nasal passages Neck: No palpable adenopathy or thyroid nodules Ear: Ear canal is clear with normal appearing TMs Cardiovascular: Regular rate and rhythm, no murmur.  Respiratory: Clear to  auscultation Neurologic: Alert and oriented x 3   Assessment/Plan: DYSPHAGIA  Plan for Procedure(s): [place], MD 08/18/2016 7:53 AM

## 2016-08-20 MED ORDER — DEXTROSE 5 % IV SOLN
3.0000 g | INTRAVENOUS | Status: AC
  Administered 2016-08-21: 3 g via INTRAVENOUS
  Filled 2016-08-20: qty 3000

## 2016-08-21 ENCOUNTER — Ambulatory Visit (HOSPITAL_COMMUNITY): Payer: BLUE CROSS/BLUE SHIELD | Admitting: Certified Registered"

## 2016-08-21 ENCOUNTER — Encounter (HOSPITAL_COMMUNITY)
Admission: RE | Disposition: A | Discharge status: Home / Self Care | Payer: Self-pay | Source: Ambulatory Visit | Attending: Otolaryngology

## 2016-08-21 ENCOUNTER — Encounter (HOSPITAL_COMMUNITY): Payer: Self-pay | Admitting: Certified Registered"

## 2016-08-21 ENCOUNTER — Ambulatory Visit (HOSPITAL_COMMUNITY)
Admission: RE | Admit: 2016-08-21 | Discharge: 2016-08-21 | Disposition: A | Discharge status: Home / Self Care | Payer: BLUE CROSS/BLUE SHIELD | Source: Ambulatory Visit | Attending: Otolaryngology | Admitting: Otolaryngology

## 2016-08-21 DIAGNOSIS — K222 Esophageal obstruction: Secondary | ICD-10-CM | POA: Insufficient documentation

## 2016-08-21 DIAGNOSIS — E785 Hyperlipidemia, unspecified: Secondary | ICD-10-CM | POA: Diagnosis not present

## 2016-08-21 DIAGNOSIS — E119 Type 2 diabetes mellitus without complications: Secondary | ICD-10-CM | POA: Diagnosis not present

## 2016-08-21 DIAGNOSIS — J302 Other seasonal allergic rhinitis: Secondary | ICD-10-CM | POA: Diagnosis not present

## 2016-08-21 DIAGNOSIS — Z6841 Body Mass Index (BMI) 40.0 and over, adult: Secondary | ICD-10-CM | POA: Diagnosis not present

## 2016-08-21 DIAGNOSIS — Z7982 Long term (current) use of aspirin: Secondary | ICD-10-CM | POA: Insufficient documentation

## 2016-08-21 DIAGNOSIS — R1314 Dysphagia, pharyngoesophageal phase: Secondary | ICD-10-CM | POA: Diagnosis not present

## 2016-08-21 DIAGNOSIS — I10 Essential (primary) hypertension: Secondary | ICD-10-CM | POA: Insufficient documentation

## 2016-08-21 DIAGNOSIS — R1310 Dysphagia, unspecified: Secondary | ICD-10-CM | POA: Diagnosis not present

## 2016-08-21 DIAGNOSIS — K219 Gastro-esophageal reflux disease without esophagitis: Secondary | ICD-10-CM | POA: Insufficient documentation

## 2016-08-21 DIAGNOSIS — Z87891 Personal history of nicotine dependence: Secondary | ICD-10-CM | POA: Diagnosis not present

## 2016-08-21 DIAGNOSIS — Z9884 Bariatric surgery status: Secondary | ICD-10-CM | POA: Insufficient documentation

## 2016-08-21 DIAGNOSIS — Z9221 Personal history of antineoplastic chemotherapy: Secondary | ICD-10-CM | POA: Insufficient documentation

## 2016-08-21 DIAGNOSIS — F419 Anxiety disorder, unspecified: Secondary | ICD-10-CM | POA: Diagnosis not present

## 2016-08-21 DIAGNOSIS — Z7984 Long term (current) use of oral hypoglycemic drugs: Secondary | ICD-10-CM | POA: Insufficient documentation

## 2016-08-21 DIAGNOSIS — Z8589 Personal history of malignant neoplasm of other organs and systems: Secondary | ICD-10-CM | POA: Insufficient documentation

## 2016-08-21 DIAGNOSIS — Z79899 Other long term (current) drug therapy: Secondary | ICD-10-CM | POA: Insufficient documentation

## 2016-08-21 DIAGNOSIS — E039 Hypothyroidism, unspecified: Secondary | ICD-10-CM | POA: Insufficient documentation

## 2016-08-21 DIAGNOSIS — G4730 Sleep apnea, unspecified: Secondary | ICD-10-CM | POA: Insufficient documentation

## 2016-08-21 DIAGNOSIS — Z800 Family history of malignant neoplasm of digestive organs: Secondary | ICD-10-CM | POA: Diagnosis not present

## 2016-08-21 DIAGNOSIS — Z96651 Presence of right artificial knee joint: Secondary | ICD-10-CM | POA: Insufficient documentation

## 2016-08-21 DIAGNOSIS — G4733 Obstructive sleep apnea (adult) (pediatric): Secondary | ICD-10-CM | POA: Diagnosis not present

## 2016-08-21 DIAGNOSIS — Z923 Personal history of irradiation: Secondary | ICD-10-CM | POA: Diagnosis not present

## 2016-08-21 HISTORY — PX: DIRECT LARYNGOSCOPY: SHX5326

## 2016-08-21 HISTORY — PX: ESOPHAGOSCOPY WITH DILITATION: SHX5618

## 2016-08-21 HISTORY — DX: Dyspnea, unspecified: R06.00

## 2016-08-21 LAB — GLUCOSE, CAPILLARY
GLUCOSE-CAPILLARY: 158 mg/dL — AB (ref 65–99)
GLUCOSE-CAPILLARY: 127 mg/dL — AB (ref 65–99)
Glucose-Capillary: 155 mg/dL — ABNORMAL HIGH (ref 65–99)

## 2016-08-21 LAB — CBC
HEMATOCRIT: 39 % (ref 39.0–52.0)
Hemoglobin: 12.7 g/dL — ABNORMAL LOW (ref 13.0–17.0)
MCH: 28.1 pg (ref 26.0–34.0)
MCHC: 32.6 g/dL (ref 30.0–36.0)
MCV: 86.3 fL (ref 78.0–100.0)
Platelets: 159 10*3/uL (ref 150–400)
RBC: 4.52 MIL/uL (ref 4.22–5.81)
RDW: 14.6 % (ref 11.5–15.5)
WBC: 8.4 10*3/uL (ref 4.0–10.5)

## 2016-08-21 LAB — BASIC METABOLIC PANEL
Anion gap: 10 (ref 5–15)
BUN: 16 mg/dL (ref 6–20)
CHLORIDE: 104 mmol/L (ref 101–111)
CO2: 24 mmol/L (ref 22–32)
Calcium: 8.9 mg/dL (ref 8.9–10.3)
Creatinine, Ser: 1.05 mg/dL (ref 0.61–1.24)
GFR calc Af Amer: >60 mL/min (ref >60)
GFR calc non Af Amer: >60 mL/min (ref >60)
GLUCOSE: 159 mg/dL — AB (ref 65–99)
POTASSIUM: 4.3 mmol/L (ref 3.5–5.1)
Sodium: 138 mmol/L (ref 135–145)

## 2016-08-21 SURGERY — LARYNGOSCOPY, DIRECT
Anesthesia: General | Site: Mouth

## 2016-08-21 MED ORDER — PROPOFOL 10 MG/ML IV BOLUS
INTRAVENOUS | Status: DC | PRN
  Administered 2016-08-21: 50 mg via INTRAVENOUS

## 2016-08-21 MED ORDER — ROCURONIUM BROMIDE 50 MG/5ML IV SOSY
PREFILLED_SYRINGE | INTRAVENOUS | Status: AC
Start: 2016-08-21 — End: ?
  Filled 2016-08-21: qty 5

## 2016-08-21 MED ORDER — LIDOCAINE 2% (20 MG/ML) 5 ML SYRINGE
INTRAMUSCULAR | Status: AC
  Filled 2016-08-21: qty 5

## 2016-08-21 MED ORDER — PROPOFOL 10 MG/ML IV BOLUS
INTRAVENOUS | Status: AC
  Filled 2016-08-21: qty 20

## 2016-08-21 MED ORDER — FENTANYL CITRATE (PF) 100 MCG/2ML IJ SOLN
INTRAMUSCULAR | Status: DC | PRN
  Administered 2016-08-21: 150 ug via INTRAVENOUS

## 2016-08-21 MED ORDER — MIDAZOLAM HCL 2 MG/2ML IJ SOLN
INTRAMUSCULAR | Status: AC
Start: 2016-08-21 — End: ?
  Filled 2016-08-21: qty 2

## 2016-08-21 MED ORDER — PROMETHAZINE HCL 25 MG/ML IJ SOLN: 6.2500 mg | INTRAMUSCULAR | Status: DC | PRN

## 2016-08-21 MED ORDER — ETOMIDATE 2 MG/ML IV SOLN
INTRAVENOUS | Status: DC | PRN
  Administered 2016-08-21: 20 mg via INTRAVENOUS

## 2016-08-21 MED ORDER — ONDANSETRON HCL 4 MG/2ML IJ SOLN
INTRAMUSCULAR | Status: AC
  Filled 2016-08-21: qty 2

## 2016-08-21 MED ORDER — MIDAZOLAM HCL 2 MG/2ML IJ SOLN: 0.5000 mg | Freq: Once | INTRAMUSCULAR | Status: DC | PRN

## 2016-08-21 MED ORDER — PHENYLEPHRINE 40 MCG/ML (10ML) SYRINGE FOR IV PUSH (FOR BLOOD PRESSURE SUPPORT)
PREFILLED_SYRINGE | INTRAVENOUS | Status: DC | PRN
  Administered 2016-08-21 (×4): 80 ug via INTRAVENOUS

## 2016-08-21 MED ORDER — MIDAZOLAM HCL 5 MG/5ML IJ SOLN
INTRAMUSCULAR | Status: DC | PRN
  Administered 2016-08-21: 2 mg via INTRAVENOUS

## 2016-08-21 MED ORDER — ONDANSETRON HCL 4 MG/2ML IJ SOLN
INTRAMUSCULAR | Status: DC | PRN
  Administered 2016-08-21: 4 mg via INTRAVENOUS

## 2016-08-21 MED ORDER — LACTATED RINGERS IV SOLN
INTRAVENOUS | Status: DC
  Administered 2016-08-21: 50 mL/h via INTRAVENOUS

## 2016-08-21 MED ORDER — EPHEDRINE SULFATE-NACL 50-0.9 MG/10ML-% IV SOSY
PREFILLED_SYRINGE | INTRAVENOUS | Status: DC | PRN
  Administered 2016-08-21 (×2): 10 mg via INTRAVENOUS

## 2016-08-21 MED ORDER — 0.9 % SODIUM CHLORIDE (POUR BTL) OPTIME
TOPICAL | Status: DC | PRN
  Administered 2016-08-21: 1000 mL

## 2016-08-21 MED ORDER — FENTANYL CITRATE (PF) 250 MCG/5ML IJ SOLN
INTRAMUSCULAR | Status: AC
  Filled 2016-08-21: qty 5

## 2016-08-21 MED ORDER — SUCCINYLCHOLINE CHLORIDE 200 MG/10ML IV SOSY
PREFILLED_SYRINGE | INTRAVENOUS | Status: DC | PRN
Start: 2016-08-21 — End: 2016-08-21
  Administered 2016-08-21: 200 mg via INTRAVENOUS

## 2016-08-21 MED ORDER — SUCCINYLCHOLINE CHLORIDE 200 MG/10ML IV SOSY
PREFILLED_SYRINGE | INTRAVENOUS | Status: AC
  Filled 2016-08-21: qty 10

## 2016-08-21 MED ORDER — MEPERIDINE HCL 25 MG/ML IJ SOLN: 6.2500 mg | INTRAMUSCULAR | Status: DC | PRN

## 2016-08-21 MED ORDER — FENTANYL CITRATE (PF) 100 MCG/2ML IJ SOLN: 25.0000 ug | INTRAMUSCULAR | Status: DC | PRN

## 2016-08-21 SURGICAL SUPPLY — 35 items
BALLN PULM 15 16.5 18X75 (BALLOONS)
BALLOON PULM 15 16.5 18X75 (BALLOONS) IMPLANT
BLADE SURG 15 STRL LF DISP TIS (BLADE) IMPLANT
BLADE SURG 15 STRL SS (BLADE)
CANISTER SUCT 1200ML W/VALVE (MISCELLANEOUS) ×2 IMPLANT
CANISTER SUCT 3000ML PPV (MISCELLANEOUS) ×2 IMPLANT
CONT SPEC 4OZ CLIKSEAL STRL BL (MISCELLANEOUS) IMPLANT
COVER BACK TABLE 60X90IN (DRAPES) ×2 IMPLANT
DRAPE HALF SHEET 40X57 (DRAPES) ×2 IMPLANT
GAUZE SPONGE 4X4 12PLY STRL (GAUZE/BANDAGES/DRESSINGS) ×2 IMPLANT
GAUZE SPONGE 4X4 16PLY XRAY LF (GAUZE/BANDAGES/DRESSINGS) ×1 IMPLANT
GLOVE SS BIOGEL STRL SZ 7.5 (GLOVE) ×1 IMPLANT
GLOVE SUPERSENSE BIOGEL SZ 7.5 (GLOVE) ×1
GOWN STRL REUS W/ TWL LRG LVL3 (GOWN DISPOSABLE) IMPLANT
GOWN STRL REUS W/ TWL XL LVL3 (GOWN DISPOSABLE) IMPLANT
GOWN STRL REUS W/TWL LRG LVL3 (GOWN DISPOSABLE)
GOWN STRL REUS W/TWL XL LVL3 (GOWN DISPOSABLE)
GUARD TEETH (MISCELLANEOUS) IMPLANT
KIT BASIN OR (CUSTOM PROCEDURE TRAY) ×2 IMPLANT
KIT ROOM TURNOVER OR (KITS) ×2 IMPLANT
NDL 18GX1X1/2 (RX/OR ONLY) (NEEDLE) IMPLANT
NDL HYPO 25GX1X1/2 BEV (NEEDLE) IMPLANT
NEEDLE 18GX1X1/2 (RX/OR ONLY) (NEEDLE) ×2 IMPLANT
NEEDLE HYPO 25GX1X1/2 BEV (NEEDLE) IMPLANT
NS IRRIG 1000ML POUR BTL (IV SOLUTION) ×2 IMPLANT
PAD ARMBOARD 7.5X6 YLW CONV (MISCELLANEOUS) ×4 IMPLANT
PATTIES SURGICAL .5 X3 (DISPOSABLE) IMPLANT
SOLUTION ANTI FOG 6CC (MISCELLANEOUS) IMPLANT
SPECIMEN JAR SMALL (MISCELLANEOUS) ×2 IMPLANT
SPONGE GAUZE 4X4 12PLY STER LF (GAUZE/BANDAGES/DRESSINGS) ×4 IMPLANT
SURGILUBE 2OZ TUBE FLIPTOP (MISCELLANEOUS) ×2 IMPLANT
SYR CONTROL 10ML LL (SYRINGE) ×1 IMPLANT
TOWEL OR 17X24 6PK STRL BLUE (TOWEL DISPOSABLE) ×4 IMPLANT
TUBE CONNECTING 12X1/4 (SUCTIONS) ×2 IMPLANT
TUBE CONNECTING 20X1/4 (TUBING) ×2 IMPLANT

## 2016-08-21 NOTE — Discharge Instructions (Addendum)
Start with soft and liquid diet and advance diet as tolerated Tylenol or motrin prn pain Can use throat lozenges prn sore throat Call office for follow up appt in 2 weeks      [phone]

## 2016-08-21 NOTE — Brief Op Note (Signed)
08/21/2016  10:31 AM  PATIENT:  Patrick Butler  56 y.o. male  PRE-OPERATIVE DIAGNOSIS:  DYSPHAGIA   POST-OPERATIVE DIAGNOSIS:  DYSPHAGIA   PROCEDURE:  Procedure(s): DIRECT LARYNGOSCOPY (N/A) ESOPHAGOSCOPY WITH DILITATION (N/A)Savory dilators 12-20  SURGEON:  Surgeon(s) and Role:    * Rozetta Nunnery, MD - Primary  PHYSICIAN ASSISTANT:   ASSISTANTS: none   ANESTHESIA:   general  EBL:  No intake/output data recorded.  BLOOD ADMINISTERED:none  DRAINS: none   LOCAL MEDICATIONS USED:  NONE  SPECIMEN:  No Specimen  DISPOSITION OF SPECIMEN:  N/A  COUNTS:  YES  TOURNIQUET:  * No tourniquets in log *  DICTATION: .Other Dictation: Dictation Number [id number]  PLAN OF CARE: Discharge to home after PACU  PATIENT DISPOSITION:  PACU - hemodynamically stable.   Delay start of Pharmacological VTE agent (>24hrs) due to surgical blood loss or risk of bleeding: yes

## 2016-08-21 NOTE — Anesthesia Procedure Notes (Signed)
Procedure Name: Intubation Date/Time: 08/21/2016 9:58 AM Performed by: Melina Copa, Colinda Barth R Pre-anesthesia Checklist: Patient identified, Emergency Drugs available, Suction available and Patient being monitored Patient Re-evaluated:Patient Re-evaluated prior to inductionOxygen Delivery Method: Circle System Utilized Preoxygenation: Pre-oxygenation with 100% oxygen Intubation Type: IV induction Ventilation: Mask ventilation without difficulty Laryngoscope Size: Mac and 4 Grade View: Grade I Tube type: Oral Tube size: 7.0 mm Number of attempts: 1 Airway Equipment and Method: Stylet Placement Confirmation: ETT inserted through vocal cords under direct vision,  positive ETCO2 and breath sounds checked- equal and bilateral Secured at: 22 cm Tube secured with: marked. Dental Injury: Teeth and Oropharynx as per pre-operative assessment

## 2016-08-21 NOTE — Anesthesia Postprocedure Evaluation (Addendum)
Anesthesia Post Note  Patient: Patrick Butler  Procedure(s) Performed: Procedure(s) (LRB): DIRECT LARYNGOSCOPY (N/A) ESOPHAGOSCOPY WITH DILITATION (N/A)  Patient location during evaluation: PACU Anesthesia Type: General Level of consciousness: awake and alert, oriented and patient cooperative Pain management: pain level controlled Vital Signs Assessment: post-procedure vital signs reviewed and stable Respiratory status: spontaneous breathing, nonlabored ventilation and respiratory function stable Cardiovascular status: blood pressure returned to baseline and stable Postop Assessment: no signs of nausea or vomiting Anesthetic complications: no       Last Vitals:  Vitals:   08/21/16 1115 08/21/16 1141  BP: (!) 147/79 134/81  Pulse: 83 84  Resp: 17 20  Temp: 36.5 C     Last Pain:  Vitals:   08/21/16 1141  TempSrc:   PainSc: 0-No pain                 Juandedios Dudash,E. Dustyn Dansereau

## 2016-08-21 NOTE — Anesthesia Preprocedure Evaluation (Addendum)
Anesthesia Evaluation  Patient identified by MRN, date of birth, ID band Patient awake    Reviewed: Allergy & Precautions, NPO status , Patient's Chart, lab work & pertinent test results  Airway Mallampati: II  TM Distance: >3 FB Neck ROM: Full    Dental  (+) Edentulous Upper, Edentulous Lower   Pulmonary sleep apnea and Continuous Positive Airway Pressure Ventilation , former smoker,    breath sounds clear to auscultation       Cardiovascular hypertension, Pt. on medications and Pt. on home beta blockers  Rhythm:Regular Rate:Normal     Neuro/Psych    GI/Hepatic Neg liver ROS, GERD  Controlled,h/o gastric bypass   Endo/Other  diabetes (glu 155), Type 2, Oral Hypoglycemic AgentsHypothyroidism Morbid obesity  Renal/GU negative Renal ROS     Musculoskeletal  (+) Arthritis ,   Abdominal (+) + obese,   Peds  Hematology   Anesthesia Other Findings h/o tonsillar cancer: Chemo, XRT  Reproductive/Obstetrics                           Anesthesia Physical Anesthesia Plan  ASA: III  Anesthesia Plan: General   Post-op Pain Management:    Induction: Intravenous  Airway Management Planned: Oral ETT  Additional Equipment: None  Intra-op Plan:   Post-operative Plan: Extubation in OR  Informed Consent: I have reviewed the patients History and Physical, chart, labs and discussed the procedure including the risks, benefits and alternatives for the proposed anesthesia with the patient or authorized representative who has indicated his/her understanding and acceptance.     Plan Discussed with: Anesthesiologist, Surgeon and CRNA  Anesthesia Plan Comments: (Plan routine monitors, GETA)       Anesthesia Quick Evaluation

## 2016-08-21 NOTE — Transfer of Care (Signed)
Immediate Anesthesia Transfer of Care Note  Patient: Patrick Butler  Procedure(s) Performed: Procedure(s): DIRECT LARYNGOSCOPY (N/A) ESOPHAGOSCOPY WITH DILITATION (N/A)  Patient Location: PACU  Anesthesia Type:General  Level of Consciousness: awake, oriented and patient cooperative  Airway & Oxygen Therapy: Patient Spontanous Breathing and [name]  Post-op Assessment: Report given to RN, Post -op Vital signs reviewed and stable and Patient moving all extremities  Post vital signs: Reviewed and stable  Last Vitals:  Vitals:   08/21/16 0706 08/21/16 0708  BP:  (!) 156/80  Pulse: 86   Resp: 18   Temp: 36.8 C     Last Pain:  Vitals:   08/21/16 0706  TempSrc: Oral      Patients Stated Pain Goal: 3 (58/83/25 4982)  Complications: No apparent anesthesia complications

## 2016-08-21 NOTE — Progress Notes (Signed)
Report given to grace rn as caregiver 

## 2016-08-22 ENCOUNTER — Encounter (HOSPITAL_COMMUNITY): Payer: Self-pay | Admitting: Otolaryngology

## 2016-08-22 NOTE — Op Note (Signed)
NAME:  RAGHAV, VERRILLI                      ACCOUNT NO.:  MEDICAL RECORD NO.:  [id number]  LOCATION:                                 FACILITY:  PHYSICIAN:  Leonides Sake. Lucia Gaskins, M.D. DATE OF BIRTH:  DATE OF PROCEDURE:  08/21/2016 DATE OF DISCHARGE:                              OPERATIVE REPORT   PREOPERATIVE DIAGNOSIS: Dysphagia with upper esophageal stricture.  POSTOPERATIVE DIAGNOSIS:  Dysphagia with upper esophageal stricture.  OPERATION PERFORMED:  Direct laryngoscopy.  Esophagoscopy with dilation with Savary dilators #12 to #20.  SURGEON:  Leonides Sake. Lucia Gaskins, M.D.  ANESTHESIA:  General endotracheal.  COMPLICATIONS:  None.  BRIEF CLINICAL NOTE:  The patient is a 56 year old gentleman, who had previous treatment of a tonsil cancer over 12 years ago with radiation chemotherapy.  He has had problems with some dysphagia and food getting caught in the upper cervical esophagus region.  He had previous dilation performed 2 years ago and started having little bit more trouble swallowing with food getting stuck in the upper cervical esophageal region.  He was taken to the operating room at this time for direct laryngoscopy and dilation of the upper cervical esophagus with the Savary dilators.  DESCRIPTION OF PROCEDURE:  After adequate endotracheal anesthesia, first direct laryngoscopy was performed.  The tonsil regions appeared clear. Base of tongue was clear.  Vallecular epiglottis were normal.  Palpation of base of tongue was soft.  The AE folds, epiglottis, and vocal cords were clear.  Both piriform sinuses were clear.  He did have some thick mucus down in the hypopharyngeal supraglottic area.  Next, the cervical esophagoscope was used to try to pass through the upper cervical esophagus and this really could not pass very well, it was not forced. There was some narrowing of the upper cervical esophagus as well as some dry thick mucus.  Starting with the Savary dilators  using __________. The dilators started with #12 and was sequentially dilated up to a #20 dilator.  Following use of the 20 dilator, the cervical esophagoscope was again passed and passed easily.  There was no significant trauma to the upper cervical esophagus except very proximal upper esophageal sphincter where there was a little bit of tearing of the mucosa, but no perforations noted.  This completed the procedure.  The patient was awoken from anesthesia and transferred to recovery room, postop doing well.  DISPOSITION:  The patient is discharged home later this morning on a soft diet initially and advance his diet as tolerated.  Suggest taking Tylenol and Motrin p.r.n. pain; and we will follow up in my office in 2- 3 weeks for recheck.          ______________________________ Leonides Sake. Lucia Gaskins, M.D.     CEN/MEDQ  D:  08/21/2016  T:  08/22/2016  Job:  [id number]  cc:   Leonides Sake. Lucia Gaskins, M.D.

## 2016-09-03 ENCOUNTER — Other Ambulatory Visit (INDEPENDENT_AMBULATORY_CARE_PROVIDER_SITE_OTHER): Payer: BLUE CROSS/BLUE SHIELD

## 2016-09-03 DIAGNOSIS — E039 Hypothyroidism, unspecified: Secondary | ICD-10-CM

## 2016-09-03 LAB — TSH: TSH: 0.28 u[IU]/mL — ABNORMAL LOW (ref 0.35–4.50)

## 2016-09-04 ENCOUNTER — Other Ambulatory Visit: Payer: Self-pay | Admitting: Emergency Medicine

## 2016-09-04 ENCOUNTER — Telehealth: Payer: Self-pay | Admitting: Emergency Medicine

## 2016-09-04 DIAGNOSIS — G4733 Obstructive sleep apnea (adult) (pediatric): Secondary | ICD-10-CM | POA: Diagnosis not present

## 2016-09-04 DIAGNOSIS — E039 Hypothyroidism, unspecified: Secondary | ICD-10-CM

## 2016-09-04 MED ORDER — LEVOTHYROXINE SODIUM 200 MCG PO TABS: 200.0000 ug | ORAL_TABLET | Freq: Every day | ORAL | 0 refills | Status: DC

## 2016-09-04 MED ORDER — LEVOTHYROXINE SODIUM 175 MCG PO TABS: 175.0000 ug | ORAL_TABLET | Freq: Every day | ORAL | 1 refills | Status: DC

## 2016-09-04 NOTE — Telephone Encounter (Signed)
Yes, 175 mcg daily.  The old dose on his med list was not updated.  New rx sent.

## 2016-09-04 NOTE — Telephone Encounter (Signed)
Spoke with pt to inform.  

## 2016-09-04 NOTE — Telephone Encounter (Signed)
Spoke with pt, he states that his RX was changed to 175 mcg six days a week and 350 mcg once a week. This adds up to be the same amount as 200 mcg daily.  Would you like him to just take 175 daily? Please advise.

## 2016-10-02 ENCOUNTER — Other Ambulatory Visit: Payer: Self-pay | Admitting: Internal Medicine

## 2016-10-16 DIAGNOSIS — G4733 Obstructive sleep apnea (adult) (pediatric): Secondary | ICD-10-CM | POA: Diagnosis not present

## 2016-10-23 ENCOUNTER — Encounter: Payer: Self-pay | Admitting: Internal Medicine

## 2016-10-23 ENCOUNTER — Other Ambulatory Visit (INDEPENDENT_AMBULATORY_CARE_PROVIDER_SITE_OTHER): Payer: BLUE CROSS/BLUE SHIELD

## 2016-10-23 DIAGNOSIS — E039 Hypothyroidism, unspecified: Secondary | ICD-10-CM | POA: Diagnosis not present

## 2016-10-23 LAB — TSH: TSH: 2.5 u[IU]/mL (ref 0.35–4.50)

## 2016-10-25 ENCOUNTER — Other Ambulatory Visit: Payer: Self-pay | Admitting: Internal Medicine

## 2016-10-31 ENCOUNTER — Other Ambulatory Visit: Payer: Self-pay | Admitting: Internal Medicine

## 2016-11-02 DIAGNOSIS — G43909 Migraine, unspecified, not intractable, without status migrainosus: Secondary | ICD-10-CM | POA: Diagnosis not present

## 2016-11-02 DIAGNOSIS — E119 Type 2 diabetes mellitus without complications: Secondary | ICD-10-CM | POA: Diagnosis not present

## 2016-11-02 DIAGNOSIS — H52203 Unspecified astigmatism, bilateral: Secondary | ICD-10-CM | POA: Diagnosis not present

## 2016-11-02 DIAGNOSIS — H2513 Age-related nuclear cataract, bilateral: Secondary | ICD-10-CM | POA: Diagnosis not present

## 2016-11-02 LAB — HM DIABETES EYE EXAM

## 2016-11-13 ENCOUNTER — Encounter: Payer: Self-pay | Admitting: Internal Medicine

## 2016-11-13 NOTE — Progress Notes (Signed)
Result abstracted and sent to scan ° °

## 2016-11-15 DIAGNOSIS — G4733 Obstructive sleep apnea (adult) (pediatric): Secondary | ICD-10-CM | POA: Diagnosis not present

## 2016-11-21 DIAGNOSIS — G4733 Obstructive sleep apnea (adult) (pediatric): Secondary | ICD-10-CM | POA: Diagnosis not present

## 2016-11-22 NOTE — Addendum Note (Signed)
Addendum  created 11/22/16 1742 by Annye Asa, MD   Sign clinical note

## 2016-12-16 DIAGNOSIS — G4733 Obstructive sleep apnea (adult) (pediatric): Secondary | ICD-10-CM | POA: Diagnosis not present

## 2017-01-02 ENCOUNTER — Other Ambulatory Visit: Payer: Self-pay | Admitting: Internal Medicine

## 2017-01-03 NOTE — Telephone Encounter (Signed)
Please advise, last filled on 02/02/16. Last ov 07/2016, has upcoming appt. Not in controlled data base. I will call it in if okay to fill.

## 2017-01-03 NOTE — Telephone Encounter (Signed)
Ok to fill. Thanks

## 2017-01-15 NOTE — Patient Instructions (Addendum)
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
## 2017-01-16 ENCOUNTER — Encounter: Payer: Self-pay | Admitting: Internal Medicine

## 2017-01-16 ENCOUNTER — Other Ambulatory Visit (INDEPENDENT_AMBULATORY_CARE_PROVIDER_SITE_OTHER): Payer: BLUE CROSS/BLUE SHIELD

## 2017-01-16 ENCOUNTER — Other Ambulatory Visit: Payer: Self-pay | Admitting: Internal Medicine

## 2017-01-16 ENCOUNTER — Ambulatory Visit (INDEPENDENT_AMBULATORY_CARE_PROVIDER_SITE_OTHER)
Admission: RE | Admit: 2017-01-16 | Discharge: 2017-01-16 | Disposition: A | Discharge status: Home / Self Care | Payer: BLUE CROSS/BLUE SHIELD | Source: Ambulatory Visit | Attending: Internal Medicine | Admitting: Internal Medicine

## 2017-01-16 ENCOUNTER — Ambulatory Visit (INDEPENDENT_AMBULATORY_CARE_PROVIDER_SITE_OTHER): Payer: BLUE CROSS/BLUE SHIELD | Admitting: Internal Medicine

## 2017-01-16 VITALS — BP 164/96 | HR 86 | Temp 98.1°F | Resp 16 | Ht 72.0 in | Wt 380.0 lb

## 2017-01-16 DIAGNOSIS — I10 Essential (primary) hypertension: Secondary | ICD-10-CM

## 2017-01-16 DIAGNOSIS — E1141 Type 2 diabetes mellitus with diabetic mononeuropathy: Secondary | ICD-10-CM

## 2017-01-16 DIAGNOSIS — K219 Gastro-esophageal reflux disease without esophagitis: Secondary | ICD-10-CM | POA: Diagnosis not present

## 2017-01-16 DIAGNOSIS — E039 Hypothyroidism, unspecified: Secondary | ICD-10-CM

## 2017-01-16 DIAGNOSIS — E7800 Pure hypercholesterolemia, unspecified: Secondary | ICD-10-CM

## 2017-01-16 DIAGNOSIS — J90 Pleural effusion, not elsewhere classified: Secondary | ICD-10-CM

## 2017-01-16 DIAGNOSIS — R0781 Pleurodynia: Secondary | ICD-10-CM | POA: Diagnosis not present

## 2017-01-16 DIAGNOSIS — G4733 Obstructive sleep apnea (adult) (pediatric): Secondary | ICD-10-CM | POA: Diagnosis not present

## 2017-01-16 DIAGNOSIS — Z23 Encounter for immunization: Secondary | ICD-10-CM | POA: Diagnosis not present

## 2017-01-16 DIAGNOSIS — F419 Anxiety disorder, unspecified: Secondary | ICD-10-CM | POA: Diagnosis not present

## 2017-01-16 DIAGNOSIS — R0789 Other chest pain: Secondary | ICD-10-CM | POA: Insufficient documentation

## 2017-01-16 DIAGNOSIS — S2242XA Multiple fractures of ribs, left side, initial encounter for closed fracture: Secondary | ICD-10-CM | POA: Diagnosis not present

## 2017-01-16 LAB — CBC WITH DIFFERENTIAL/PLATELET
BASOS ABS: 0.1 10*3/uL (ref 0.0–0.1)
BASOS PCT: 0.5 % (ref 0.0–3.0)
EOS ABS: 0.2 10*3/uL (ref 0.0–0.7)
Eosinophils Relative: 2.1 % (ref 0.0–5.0)
HEMATOCRIT: 42.4 % (ref 39.0–52.0)
HEMOGLOBIN: 13.8 g/dL (ref 13.0–17.0)
LYMPHS PCT: 35.1 % (ref 12.0–46.0)
Lymphs Abs: 3.4 10*3/uL (ref 0.7–4.0)
MCHC: 32.5 g/dL (ref 30.0–36.0)
MCV: 88.6 fl (ref 78.0–100.0)
MONOS PCT: 6.7 % (ref 3.0–12.0)
Monocytes Absolute: 0.6 10*3/uL (ref 0.1–1.0)
Neutro Abs: 5.3 10*3/uL (ref 1.4–7.7)
Neutrophils Relative %: 55.6 % (ref 43.0–77.0)
Platelets: 219 10*3/uL (ref 150.0–400.0)
RBC: 4.78 Mil/uL (ref 4.22–5.81)
RDW: 14.7 % (ref 11.5–15.5)
WBC: 9.6 10*3/uL (ref 4.0–10.5)

## 2017-01-16 LAB — HEMOGLOBIN A1C: HEMOGLOBIN A1C: 7 % — AB (ref 4.6–6.5)

## 2017-01-16 LAB — LIPID PANEL
CHOL/HDL RATIO: 3
Cholesterol: 127 mg/dL (ref 0–200)
HDL: 43.8 mg/dL (ref >39.00)
LDL CALC: 43 mg/dL (ref 0–99)
NONHDL: 83.38
TRIGLYCERIDES: 200 mg/dL — AB (ref 0.0–149.0)
VLDL: 40 mg/dL (ref 0.0–40.0)

## 2017-01-16 LAB — COMPREHENSIVE METABOLIC PANEL
ALT: 21 U/L (ref 0–53)
AST: 18 U/L (ref 0–37)
Albumin: 4 g/dL (ref 3.5–5.2)
Alkaline Phosphatase: 65 U/L (ref 39–117)
BILIRUBIN TOTAL: 0.5 mg/dL (ref 0.2–1.2)
BUN: 15 mg/dL (ref 6–23)
CALCIUM: 9.7 mg/dL (ref 8.4–10.5)
CO2: 26 mEq/L (ref 19–32)
CREATININE: 1.19 mg/dL (ref 0.40–1.50)
Chloride: 103 mEq/L (ref 96–112)
GFR: 66.68 mL/min (ref >60.00)
Glucose, Bld: 138 mg/dL — ABNORMAL HIGH (ref 70–99)
Potassium: 5.1 mEq/L (ref 3.5–5.1)
Sodium: 139 mEq/L (ref 135–145)
Total Protein: 7.5 g/dL (ref 6.0–8.3)

## 2017-01-16 LAB — TSH: TSH: 1.29 u[IU]/mL (ref 0.35–4.50)

## 2017-01-16 NOTE — Assessment & Plan Note (Signed)
Takes ativan only as needed Will continue as needed

## 2017-01-16 NOTE — Assessment & Plan Note (Signed)
Check lipid panel  Continue daily statin Regular exercise and healthy diet encouraged  

## 2017-01-16 NOTE — Assessment & Plan Note (Signed)
Will check xray of left sided ribs and chest xray Pain controlled with otc pain medications Discussed risk of PNA

## 2017-01-16 NOTE — Assessment & Plan Note (Signed)
Check a1c Low sugar / carb diet Stressed regular exercise   

## 2017-01-16 NOTE — Assessment & Plan Note (Signed)
BP very high today - likely related to pain   BP Readings from Last 3 Encounters:  01/16/17 (!) 164/96  08/21/16 134/81  07/12/16 (!) 162/96   Advised him to monitor BP at home Continue current medications cmp

## 2017-01-16 NOTE — Assessment & Plan Note (Signed)
GERD controlled Continue daily medication  

## 2017-01-16 NOTE — Assessment & Plan Note (Signed)
Check tsh  Titrate med dose if needed  

## 2017-01-17 ENCOUNTER — Encounter: Payer: Self-pay | Admitting: Internal Medicine

## 2017-01-22 ENCOUNTER — Ambulatory Visit (INDEPENDENT_AMBULATORY_CARE_PROVIDER_SITE_OTHER)
Admission: RE | Admit: 2017-01-22 | Discharge: 2017-01-22 | Disposition: A | Discharge status: Home / Self Care | Payer: BLUE CROSS/BLUE SHIELD | Source: Ambulatory Visit | Attending: Pulmonary Disease | Admitting: Pulmonary Disease

## 2017-01-22 ENCOUNTER — Ambulatory Visit (INDEPENDENT_AMBULATORY_CARE_PROVIDER_SITE_OTHER): Payer: BLUE CROSS/BLUE SHIELD | Admitting: Pulmonary Disease

## 2017-01-22 ENCOUNTER — Encounter: Payer: Self-pay | Admitting: Pulmonary Disease

## 2017-01-22 VITALS — BP 144/82 | HR 81 | Ht 72.0 in | Wt 378.0 lb

## 2017-01-22 DIAGNOSIS — J942 Hemothorax: Secondary | ICD-10-CM | POA: Diagnosis not present

## 2017-01-22 DIAGNOSIS — M79604 Pain in right leg: Secondary | ICD-10-CM

## 2017-01-22 DIAGNOSIS — L03115 Cellulitis of right lower limb: Secondary | ICD-10-CM | POA: Diagnosis not present

## 2017-01-22 DIAGNOSIS — S2242XA Multiple fractures of ribs, left side, initial encounter for closed fracture: Secondary | ICD-10-CM | POA: Diagnosis not present

## 2017-01-22 DIAGNOSIS — M7989 Other specified soft tissue disorders: Secondary | ICD-10-CM | POA: Diagnosis not present

## 2017-01-22 MED ORDER — DOXYCYCLINE HYCLATE 100 MG PO TABS: 100.0000 mg | ORAL_TABLET | Freq: Two times a day (BID) | ORAL | 0 refills | Status: DC
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
## 2017-01-22 NOTE — Addendum Note (Signed)
Addended by: Len Blalock on: 01/22/2017 02:18 PM   Modules accepted: Orders

## 2017-01-22 NOTE — Progress Notes (Signed)
   Subjective:    Patient ID: Patrick Butler, male    DOB: 11-26-1960, 56 y.o.   MRN: [id number]  HPI    Review of Systems  Constitutional: Negative for fever and unexpected weight change.  HENT: Positive for congestion. Negative for dental problem, ear pain, nosebleeds, postnasal drip, rhinorrhea, sinus pressure, sneezing, sore throat and trouble swallowing.   Eyes: Negative for redness and itching.  Respiratory: Positive for cough, chest tightness and shortness of breath. Negative for wheezing.   Cardiovascular: Negative for palpitations and leg swelling.  Gastrointestinal: Negative for nausea and vomiting.  Genitourinary: Negative for dysuria.  Musculoskeletal: Negative for joint swelling.  Skin: Negative for rash.  Neurological: Negative for headaches.  Hematological: Does not bruise/bleed easily.  Psychiatric/Behavioral: Negative for dysphoric mood. The patient is not nervous/anxious.        Objective:   Physical Exam        Assessment & Plan:

## 2017-01-22 NOTE — Patient Instructions (Signed)
For  leg redness and swelling: We will treat this as cellulitis with doxycycline for a week, be sure to use sunscreen when you're taking the doxycycline because it can give you a bad sunburn We will check a lower extremity ultrasound to make sure you do not have a blood clot  For the fluid collection in the left lung with broken ribs: I am concerned that this is a hemothorax or blood in the chest related to trauma We will get a CT scan as soon as possible We will refer you to thoracic surgery as soon as possible, you may need to have this removed surgically  We will see you back in one week to see how things are going, nurse practitioner visit

## 2017-01-23 ENCOUNTER — Ambulatory Visit (HOSPITAL_COMMUNITY)
Admission: RE | Admit: 2017-01-23 | Discharge: 2017-01-23 | Disposition: A | Discharge status: Home / Self Care | Payer: BLUE CROSS/BLUE SHIELD | Source: Ambulatory Visit | Attending: Vascular Surgery | Admitting: Vascular Surgery

## 2017-01-23 DIAGNOSIS — M7989 Other specified soft tissue disorders: Secondary | ICD-10-CM | POA: Diagnosis not present

## 2017-01-23 DIAGNOSIS — M79604 Pain in right leg: Secondary | ICD-10-CM | POA: Insufficient documentation

## 2017-01-23 DIAGNOSIS — M7121 Synovial cyst of popliteal space [Baker], right knee: Secondary | ICD-10-CM | POA: Insufficient documentation

## 2017-01-24 ENCOUNTER — Other Ambulatory Visit: Payer: Self-pay | Admitting: Internal Medicine

## 2017-01-29 ENCOUNTER — Other Ambulatory Visit: Payer: Self-pay | Admitting: Internal Medicine

## 2017-01-30 ENCOUNTER — Encounter: Payer: Self-pay | Admitting: Adult Health

## 2017-01-30 ENCOUNTER — Ambulatory Visit (INDEPENDENT_AMBULATORY_CARE_PROVIDER_SITE_OTHER): Payer: BLUE CROSS/BLUE SHIELD | Admitting: Adult Health

## 2017-01-30 DIAGNOSIS — S2242XD Multiple fractures of ribs, left side, subsequent encounter for fracture with routine healing: Secondary | ICD-10-CM | POA: Diagnosis not present

## 2017-01-30 DIAGNOSIS — L0390 Cellulitis, unspecified: Secondary | ICD-10-CM | POA: Insufficient documentation

## 2017-01-30 DIAGNOSIS — S2239XA Fracture of one rib, unspecified side, initial encounter for closed fracture: Secondary | ICD-10-CM | POA: Insufficient documentation

## 2017-01-30 DIAGNOSIS — S2249XA Multiple fractures of ribs, unspecified side, initial encounter for closed fracture: Secondary | ICD-10-CM | POA: Insufficient documentation

## 2017-01-30 DIAGNOSIS — L03115 Cellulitis of right lower limb: Secondary | ICD-10-CM | POA: Diagnosis not present
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
## 2017-01-30 NOTE — Progress Notes (Signed)
Reviewed, agree 

## 2017-01-30 NOTE — Patient Instructions (Signed)
Keep legs elevated.  Saline nasal rinses As needed   Mucinex Twice daily As needed  Congestion  Follow up with .Dr. [place] Bells As needed   Keep follow up with Primary MD as planned and As needed   Follow up with Dr. Elsworth Soho for sleep apnea as planned .

## 2017-01-30 NOTE — Assessment & Plan Note (Signed)
RLE cellulitis improved after abx   Plan  Patient Instructions  Keep legs elevated.  Saline nasal rinses As needed   Mucinex Twice daily As needed  Congestion  Follow up with .Dr. [place] Bells As needed   Keep follow up with Primary MD as planned and As needed   Follow up with Dr. Elsworth Soho for sleep apnea as planned .

## 2017-02-05 ENCOUNTER — Telehealth: Payer: BLUE CROSS/BLUE SHIELD | Admitting: Family

## 2017-02-05 DIAGNOSIS — R350 Frequency of micturition: Secondary | ICD-10-CM

## 2017-02-05 MED ORDER — CIPROFLOXACIN HCL 500 MG PO TABS: 500.0000 mg | ORAL_TABLET | Freq: Two times a day (BID) | ORAL | 0 refills | Status: DC

## 2017-02-05 NOTE — Progress Notes (Signed)
We are sorry that you are not feeling well.  Here is how we plan to help!  Male bladder infections are not very common.  We worry about prostate or kidney conditions.  The standard of care is to examine the abdomen and kidneys, and to do a urine and blood test to make sure that something more serious is not going on.  We recommend that you see a provider today.  If your doctor's office is closed [facility] has the following Urgent Cares:  If you need care fast and have a high deductible or no insurance consider:   [email or web address]  [phone]  [address], [place], [place] [address] 8 am to 8 pm Monday-Friday 10 am to 4 pm Saturday-Sunday   The following sites will take your  insurance:    . [facility] [place] a Provider at this Location  [address] [place], [place] [address] . 10 am to 8 pm Monday-Friday . 12 pm to 8 pm Saturday-Sunday   . [facility] [place] a Provider at this Location  [address], [place], [place] [address] . 8 am to 8 pm Monday-Friday . 9 am to 6 pm Saturday . 11 am to 6 pm Sunday   . [facility] at [place] Get Driving Directions  [address] [place], [place] [address] . 8 am to 8 pm Monday-Friday . 8 am to 4 pm Saturday-Sunday   . [facility] (a walk in primary care provider)  [place] a Provider at this Location  [address], [place] [address] . 8 am to 8:30 pm Monday-Thursday . 8 am to 6 pm Friday . 8 am to 4 pm Saturday-Sunday   Your e-visit answers were reviewed by a board certified advanced clinical practitioner to complete your personal care plan.  Thank you for using e-Visits.

## 2017-02-05 NOTE — Addendum Note (Signed)
Addended by: Dutch Quint B on: 02/05/2017 03:25 PM   Modules accepted: Orders

## 2017-02-11 ENCOUNTER — Other Ambulatory Visit: Payer: Self-pay | Admitting: Internal Medicine

## 2017-02-11 NOTE — Telephone Encounter (Signed)
Please advise on dose.

## 2017-02-15 DIAGNOSIS — G4733 Obstructive sleep apnea (adult) (pediatric): Secondary | ICD-10-CM | POA: Diagnosis not present

## 2017-02-16 ENCOUNTER — Other Ambulatory Visit: Payer: Self-pay | Admitting: Internal Medicine

## 2017-03-08 DIAGNOSIS — R21 Rash and other nonspecific skin eruption: Secondary | ICD-10-CM | POA: Diagnosis not present

## 2017-03-18 DIAGNOSIS — G4733 Obstructive sleep apnea (adult) (pediatric): Secondary | ICD-10-CM | POA: Diagnosis not present

## 2017-04-09 ENCOUNTER — Encounter: Payer: Self-pay | Admitting: Pulmonary Disease

## 2017-04-10 ENCOUNTER — Telehealth: Payer: Self-pay | Admitting: Pulmonary Disease

## 2017-04-10 DIAGNOSIS — G4733 Obstructive sleep apnea (adult) (pediatric): Secondary | ICD-10-CM

## 2017-04-10 NOTE — Telephone Encounter (Signed)
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

## 2017-04-10 NOTE — Telephone Encounter (Signed)
Called pt. He states his pressure may be too high and he states it is on auto and the DME cannot change it until we send another prescription. It dries him up and uses all the water each night. RA should we change pressure settings? Pt states the pressure is too high. I will place download in your folder.

## 2017-04-10 NOTE — Telephone Encounter (Signed)
I reviewed his download, events well-controlled onset pressure of 16 cm with good compliance and has large leak. Changed settings to auto 10-16 cm and recheck download in 1 month

## 2017-04-18 DIAGNOSIS — G4733 Obstructive sleep apnea (adult) (pediatric): Secondary | ICD-10-CM | POA: Diagnosis not present

## 2017-06-04 ENCOUNTER — Encounter: Payer: Self-pay | Admitting: Pulmonary Disease

## 2017-06-04 ENCOUNTER — Ambulatory Visit: Payer: BLUE CROSS/BLUE SHIELD | Admitting: Pulmonary Disease

## 2017-06-04 DIAGNOSIS — G4733 Obstructive sleep apnea (adult) (pediatric): Secondary | ICD-10-CM | POA: Diagnosis not present

## 2017-06-04 DIAGNOSIS — I10 Essential (primary) hypertension: Secondary | ICD-10-CM

## 2017-06-04 DIAGNOSIS — S2232XD Fracture of one rib, left side, subsequent encounter for fracture with routine healing: Secondary | ICD-10-CM

## 2017-06-04 NOTE — Patient Instructions (Signed)
CPAP is working well on auto settings. Rib fracture does appear to have healed

## 2017-06-04 NOTE — Assessment & Plan Note (Signed)
Healed by now

## 2017-06-04 NOTE — Assessment & Plan Note (Signed)
CPAP is working well on auto settings supplies will be renewed for a year.  He seems to have adjusted well to nasal pillows  Weight loss encouraged, compliance with goal of at least 4-6 hrs every night is the expectation. Advised against medications with sedative side effects Cautioned against driving when sleepy - understanding that sleepiness will vary on a day to day basis
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
## 2017-06-04 NOTE — Assessment & Plan Note (Signed)
Slight high today, he will follow-up with PCP

## 2017-06-20 ENCOUNTER — Other Ambulatory Visit: Payer: Self-pay | Admitting: Internal Medicine

## 2017-06-20 DIAGNOSIS — G4733 Obstructive sleep apnea (adult) (pediatric): Secondary | ICD-10-CM | POA: Diagnosis not present

## 2017-06-20 NOTE — Telephone Encounter (Signed)
[place] Controlled Substance Database checked. Last filled on 01/03/17

## 2017-06-27 DIAGNOSIS — G4733 Obstructive sleep apnea (adult) (pediatric): Secondary | ICD-10-CM | POA: Diagnosis not present

## 2017-07-17 ENCOUNTER — Ambulatory Visit: Payer: BLUE CROSS/BLUE SHIELD | Admitting: Internal Medicine

## 2017-07-17 NOTE — Patient Instructions (Addendum)
  Test(s) ordered today. Your results will be released to MyChart (or called to you) after review, usually within 72hours after test completion. If any changes need to be made, you will be notified at that same time.  Medications reviewed and updated.  No changes recommended at this time.    Please followup in 6 months   

## 2017-07-17 NOTE — Progress Notes (Signed)
Subjective:    Patient ID: Patrick Butler, male    DOB: 1960-11-06, 57 y.o.   MRN: [id number]  HPI The patient is here for follow up.  Hypertension: He is taking his medication daily. He is compliant with a low sodium diet.  Right leg edema since accident.  He denies chest pain, palpitations, shortness of breath and regular headaches. He is not exercising regularly.      Diabetes: He is taking his medication daily as prescribed. He is compliant with a diabetic diet. He is not exercising regularly.  He is up-to-date with an ophthalmology examination.   Hypothyroidism:  He is taking his medication daily.  He denies any recent changes in energy or weight that are unexplained.   GERD:  He is taking his medication daily as prescribed.  He denies any GERD symptoms and feels his GERD is well controlled.   Anxiety: He is taking his medication daily as prescribed. He denies any side effects from the medication. He feels his anxiety is well controlled and he is happy with his current dose of medication.   He has decreased his calorie intake and has lost weight.    Medications and allergies reviewed with patient and updated if appropriate.  Patient Active Problem List   Diagnosis Date Noted  . Rib fractures 01/30/2017  . Cellulitis 01/30/2017  . Rib pain on left side 01/16/2017  . Anxiety 07/12/2016  . Rash and nonspecific skin eruption 01/13/2016  . Urinary frequency 01/13/2016  . Acute medial meniscus tear of left knee 10/28/2015  . History of colonic polyps   . History of cancer tonsil 07/13/2015  . Erectile dysfunction 07/13/2015  . Type 2 diabetes mellitus ([place]) 01/12/2014  . Severe obesity (BMI >= 40) ([place]) 10/08/2013  . Nonspecific abnormal electrocardiogram (ECG) (EKG) 09/22/2012  . OSA (obstructive sleep apnea) 08/08/2011  . Lumbar disc prolapse with compression radiculopathy 05/24/2011  . GERD 01/18/2009  . HYPERPLASIA PROSTATE UNS W/O UR OBST & OTH LUTS 01/18/2009  .  Hypothyroidism 03/23/2008  . Hyperlipidemia 03/23/2008  . Essential hypertension 03/23/2008    Current Outpatient Medications on File Prior to Visit  Medication Sig Dispense Refill  . amLODipine-benazepril (LOTREL) 5-40 MG capsule TAKE 1 CAPSULE BY MOUTH DAILY 90 capsule 1  . aspirin EC 81 MG tablet Take 81 mg by mouth every evening.     Marland Kitchen atenolol (TENORMIN) 100 MG tablet Take 1 tablet (100 mg total) by mouth daily. 90 tablet 3  . cetirizine (ZYRTEC) 10 MG tablet Take 10 mg by mouth daily.    . Cyanocobalamin (B-12 DOTS) 500 MCG SUBL Place 500 mcg under the tongue daily.     . Doxylamine-Phenylephrine-APAP (SINUS & CONGESTION DAY/NIGHT PO) Take 2 tablets by mouth daily as needed (SINUS HEADACHE).    . fluticasone (FLONASE) 50 MCG/ACT nasal spray Place 2 sprays into the nose as directed. At bedtime (Patient taking differently: Place 2 sprays into the nose at bedtime. ) 16 g 5  . levothyroxine (SYNTHROID, LEVOTHROID) 200 MCG tablet TAKE 1 TABLET BY MOUTH DAILY BEFORE BREAKFAST 90 tablet 1  . LORazepam (ATIVAN) 0.5 MG tablet TAKE 1 TABLET BY MOUTH DAILY AS NEEDED FOR ANXIETY 30 tablet 0  . metFORMIN (GLUCOPHAGE) 500 MG tablet TAKE 1 TABLET BY MOUTH TWICE DAILY WITH MEALS 180 tablet 1  . Multiple Vitamins-Minerals (MULTIVITAMIN WITH MINERALS) tablet Take 1 tablet by mouth daily.     Marland Kitchen OVER THE COUNTER MEDICATION Take 15 mLs by mouth daily. Glucosamine &  Chondroitin Liquid    . pantoprazole (PROTONIX) 40 MG tablet TAKE 1 TABLET BY MOUTH DAILY FOR REFLUX 90 tablet 3  . pravastatin (PRAVACHOL) 40 MG tablet TAKE 1 TABLET BY MOUTH DAILY 90 tablet 1  . tolterodine (DETROL LA) 4 MG 24 hr capsule Take 4 mg by mouth every evening.     . triamcinolone (KENALOG) 0.025 % ointment Apply 1 application topically 2 (two) times daily. Do not use from more than two weeks at a time (Patient taking differently: Apply 1 application topically daily as needed (RASH). Do not use from more than two weeks at a time) 30 g 0    No current facility-administered medications on file prior to visit.     Past Medical History:  Diagnosis Date  . Anxiety   . Arthritis    right knee  . Cancer ([place]) 2005   tonsils/Chemo and radiation  . Dysphagia 08/2014  . Dyspnea    with exertion  . Full dentures   . GERD (gastroesophageal reflux disease)   . History of chemotherapy   . History of radiation therapy    neck  . Hyperlipidemia   . Hypertension    states under control with med., has been on med. ~ 5 yr.  . Hypothyroidism   . Non-insulin dependent type 2 diabetes mellitus ([place])   . Posterior neck pain   . Seasonal allergies   . Sleep apnea    uses CPAP nightly    Past Surgical History:  Procedure Laterality Date  . COLONOSCOPY  2012  . COLONOSCOPY WITH PROPOFOL N/A 10/04/2015   Procedure: COLONOSCOPY WITH PROPOFOL;  Surgeon: Doran Stabler, MD;  Location: [facility];  Service: Gastroenterology;  Laterality: N/A;  . DIRECT LARYNGOSCOPY N/A 08/20/2014   Procedure: DIRECT LARYNGOSCOPY;  Surgeon: Rozetta Nunnery, MD;  Location: [facility];  Service: ENT;  Laterality: N/A;  . DIRECT LARYNGOSCOPY N/A 08/21/2016   Procedure: DIRECT LARYNGOSCOPY;  Surgeon: Rozetta Nunnery, MD;  Location: [facility];  Service: ENT;  Laterality: N/A;  . ESOPHAGOSCOPY WITH DILITATION N/A 08/20/2014   Procedure: ESOPHAGOSCOPY WITH DILATATION;  Surgeon: Rozetta Nunnery, MD;  Location: [facility];  Service: ENT;  Laterality: N/A;  . ESOPHAGOSCOPY WITH DILITATION N/A 08/21/2016   Procedure: ESOPHAGOSCOPY WITH DILITATION;  Surgeon: Rozetta Nunnery, MD;  Location: [facility];  Service: ENT;  Laterality: N/A;  . KNEE ARTHROPLASTY Right   . KNEE ARTHROSCOPY Right    x 3  . KNEE ARTHROSCOPY Left 10/28/2015   Procedure: ARTHROSCOPY KNEE;  Surgeon: Dorna Leitz, MD;  Location: [facility];  Service: Orthopedics;  Laterality: Left;  left knee partial medial meniscectomy and patellofemoral chondroplasty  .  LAPAROSCOPIC GASTRIC BYPASS  2004  . LUMBAR LAMINECTOMY/DECOMPRESSION MICRODISCECTOMY  05/24/2011   Procedure: LUMBAR LAMINECTOMY/DECOMPRESSION MICRODISCECTOMY;  Surgeon: Winfield Cunas, MD;  Location: [facility];  Service: Neurosurgery;  Laterality: Right;  RIGHT L5-S1 Laminectomy, discectomy  . LYMPHADENECTOMY Right    cervical  . MULTIPLE TOOTH EXTRACTIONS    . PORT-A-CATH REMOVAL    . PORTACATH PLACEMENT    . REPLACEMENT TOTAL KNEE Right 03/22/2004  . SHOULDER ARTHROSCOPY Left   . SHOULDER OPEN ROTATOR CUFF REPAIR Right     Social History   Socioeconomic History  . Marital status: Married    Spouse name: [name]  . Number of children: None  . Years of education: None  . Highest education level: None  Social Needs  . Financial resource strain:  None  . Food insecurity - worry: None  . Food insecurity - inability: None  . Transportation needs - medical: None  . Transportation needs - non-medical: None  Occupational History  . Occupation: Information systems manager: [employer]  Tobacco Use  . Smoking status: Former Smoker    Packs/day: 0.00    Years: 25.00    Pack years: 0.00    Last attempt to quit: 10/25/2002    Years since quitting: 14.7  . Smokeless tobacco: Never Used  Substance and Sexual Activity  . Alcohol use: No    Alcohol/week: 1.8 - 2.4 oz    Types: 3 - 4 Standard drinks or equivalent per week    Comment: 08/17/16- " no desire in past 3- 4 months  . Drug use: No  . Sexual activity: Yes  Other Topics Concern  . None  Social History Narrative  . None    Family History  Problem Relation Age of Onset  . Colon cancer Paternal Uncle   . Heart attack Maternal Uncle         X 2; both pre 55  . Hypertension Mother   . Breast cancer Mother   . Coronary artery disease Mother   . Brain cancer Paternal Uncle   . Diabetes Maternal Grandfather   . Hypertension Maternal Grandfather        ? metastatic; ? primary  . Diabetes Father   . Stroke Father 65     Review of Systems  Constitutional: Negative for chills and fever.  Respiratory: Negative for cough, shortness of breath and wheezing.   Cardiovascular: Positive for leg swelling (right leg intermittent since accident). Negative for chest pain and palpitations.  Musculoskeletal: Positive for arthralgias.  Skin:       Intermittent itching throughout body - can last two weeks  Neurological: Negative for light-headedness and headaches.       Objective:   Vitals:   07/18/17 0938  BP: 130/80  Pulse: 83  Resp: 16  Temp: 98.3 F (36.8 C)  SpO2: 94%   BP Readings from Last 3 Encounters:  07/18/17 130/80  06/04/17 (!) 142/92  01/30/17 140/84   Wt Readings from Last 3 Encounters:  07/18/17 (!) 363 lb (164.7 kg)  06/04/17 (!) 379 lb (171.9 kg)  01/30/17 (!) 376 lb 3.2 oz (170.6 kg)   Body mass index is 49.92 kg/m.   Physical Exam    Constitutional: Appears well-developed and well-nourished. No distress.  HENT:  Head: Normocephalic and atraumatic.  Neck: Neck supple. No tracheal deviation present. No thyromegaly present.  No cervical lymphadenopathy Cardiovascular: Normal rate, regular rhythm and normal heart sounds.   No murmur heard. No carotid bruit .  Trace RLE edema Pulmonary/Chest: Effort normal and breath sounds normal. No respiratory distress. No has no wheezes. No rales.  Skin: Skin is warm and dry. Not diaphoretic.  Psychiatric: Normal mood and affect. Behavior is normal.      Assessment & Plan:    See Problem List for Assessment and Plan of chronic medical problems.

## 2017-07-18 ENCOUNTER — Other Ambulatory Visit (INDEPENDENT_AMBULATORY_CARE_PROVIDER_SITE_OTHER): Payer: BLUE CROSS/BLUE SHIELD

## 2017-07-18 ENCOUNTER — Encounter: Payer: Self-pay | Admitting: Internal Medicine

## 2017-07-18 ENCOUNTER — Ambulatory Visit: Payer: BLUE CROSS/BLUE SHIELD | Admitting: Internal Medicine

## 2017-07-18 VITALS — BP 130/80 | HR 83 | Temp 98.3°F | Resp 16 | Wt 363.0 lb

## 2017-07-18 DIAGNOSIS — I10 Essential (primary) hypertension: Secondary | ICD-10-CM

## 2017-07-18 DIAGNOSIS — E039 Hypothyroidism, unspecified: Secondary | ICD-10-CM

## 2017-07-18 DIAGNOSIS — E1141 Type 2 diabetes mellitus with diabetic mononeuropathy: Secondary | ICD-10-CM | POA: Diagnosis not present

## 2017-07-18 DIAGNOSIS — K219 Gastro-esophageal reflux disease without esophagitis: Secondary | ICD-10-CM

## 2017-07-18 DIAGNOSIS — E7849 Other hyperlipidemia: Secondary | ICD-10-CM | POA: Diagnosis not present

## 2017-07-18 DIAGNOSIS — F419 Anxiety disorder, unspecified: Secondary | ICD-10-CM | POA: Diagnosis not present

## 2017-07-18 LAB — COMPREHENSIVE METABOLIC PANEL
ALT: 31 U/L (ref 0–53)
AST: 26 U/L (ref 0–37)
Albumin: 4.1 g/dL (ref 3.5–5.2)
Alkaline Phosphatase: 71 U/L (ref 39–117)
BUN: 18 mg/dL (ref 6–23)
CHLORIDE: 102 meq/L (ref 96–112)
CO2: 28 meq/L (ref 19–32)
Calcium: 9.9 mg/dL (ref 8.4–10.5)
Creatinine, Ser: 1.22 mg/dL (ref 0.40–1.50)
GFR: 64.68 mL/min (ref >60.00)
Glucose, Bld: 132 mg/dL — ABNORMAL HIGH (ref 70–99)
POTASSIUM: 4.9 meq/L (ref 3.5–5.1)
SODIUM: 137 meq/L (ref 135–145)
Total Bilirubin: 0.4 mg/dL (ref 0.2–1.2)
Total Protein: 7.6 g/dL (ref 6.0–8.3)

## 2017-07-18 LAB — HEMOGLOBIN A1C: HEMOGLOBIN A1C: 6.9 % — AB (ref 4.6–6.5)

## 2017-07-18 LAB — LIPID PANEL
Cholesterol: 138 mg/dL (ref 0–200)
HDL: 36.9 mg/dL — ABNORMAL LOW (ref >39.00)
LDL CALC: 64 mg/dL (ref 0–99)
NonHDL: 101.39
TRIGLYCERIDES: 186 mg/dL — AB (ref 0.0–149.0)
Total CHOL/HDL Ratio: 4
VLDL: 37.2 mg/dL (ref 0.0–40.0)

## 2017-07-18 LAB — TSH: TSH: 0.83 u[IU]/mL (ref 0.35–4.50)

## 2017-07-18 NOTE — Assessment & Plan Note (Signed)
Controlled, stable Continue current dose of medication - takes ativan only prn

## 2017-07-18 NOTE — Assessment & Plan Note (Signed)
Check lipid panel  Continue daily statin Regular exercise and healthy diet encouraged  

## 2017-07-18 NOTE — Assessment & Plan Note (Signed)
Check tsh  Titrate med dose if needed  

## 2017-07-18 NOTE — Assessment & Plan Note (Signed)
Check a1c Low sugar / carb diet Stressed regular exercise, weight loss  

## 2017-07-18 NOTE — Assessment & Plan Note (Signed)
BP well controlled Current regimen effective and well tolerated Continue current medications at current doses cmp  

## 2017-07-18 NOTE — Assessment & Plan Note (Signed)
GERD controlled Continue daily medication  

## 2017-07-19 ENCOUNTER — Encounter: Payer: Self-pay | Admitting: Internal Medicine

## 2017-07-22 ENCOUNTER — Other Ambulatory Visit: Payer: Self-pay | Admitting: Internal Medicine

## 2017-07-25 DIAGNOSIS — G4733 Obstructive sleep apnea (adult) (pediatric): Secondary | ICD-10-CM | POA: Diagnosis not present

## 2017-07-30 ENCOUNTER — Other Ambulatory Visit: Payer: Self-pay | Admitting: Internal Medicine

## 2017-08-12 ENCOUNTER — Other Ambulatory Visit: Payer: Self-pay | Admitting: Internal Medicine

## 2017-08-15 ENCOUNTER — Other Ambulatory Visit: Payer: Self-pay | Admitting: Internal Medicine

## 2017-08-22 ENCOUNTER — Other Ambulatory Visit: Payer: Self-pay | Admitting: Internal Medicine

## 2017-08-25 DIAGNOSIS — G4733 Obstructive sleep apnea (adult) (pediatric): Secondary | ICD-10-CM | POA: Diagnosis not present

## 2017-09-24 DIAGNOSIS — G4733 Obstructive sleep apnea (adult) (pediatric): Secondary | ICD-10-CM | POA: Diagnosis not present

## 2017-10-03 ENCOUNTER — Other Ambulatory Visit: Payer: Self-pay | Admitting: Internal Medicine

## 2017-10-15 DIAGNOSIS — G4733 Obstructive sleep apnea (adult) (pediatric): Secondary | ICD-10-CM | POA: Diagnosis not present

## 2017-10-25 DIAGNOSIS — G4733 Obstructive sleep apnea (adult) (pediatric): Secondary | ICD-10-CM | POA: Diagnosis not present

## 2017-11-18 DIAGNOSIS — H2513 Age-related nuclear cataract, bilateral: Secondary | ICD-10-CM | POA: Diagnosis not present

## 2017-11-18 DIAGNOSIS — H5201 Hypermetropia, right eye: Secondary | ICD-10-CM | POA: Diagnosis not present

## 2017-11-18 DIAGNOSIS — E119 Type 2 diabetes mellitus without complications: Secondary | ICD-10-CM | POA: Diagnosis not present

## 2017-11-18 LAB — HM DIABETES EYE EXAM

## 2017-11-20 ENCOUNTER — Encounter: Payer: Self-pay | Admitting: Internal Medicine

## 2017-11-24 DIAGNOSIS — G4733 Obstructive sleep apnea (adult) (pediatric): Secondary | ICD-10-CM | POA: Diagnosis not present

## 2018-01-20 NOTE — Progress Notes (Signed)
Subjective:    Patient ID: Patrick Butler, male    DOB: [birth year], 59 y.o.   MRN: [id number]  HPI The patient is here for follow up.  Hypertension: He is taking his medication daily. He is compliant with a low sodium diet.  He denies chest pain, palpitations, edema, shortness of breath and regular headaches. He is not exercising regularly.  He does not monitor his blood pressure at home.    Diabetes: He is taking his medication daily as prescribed. He is compliant with a diabetic diet. He is not exercising regularly. He is up-to-date with an ophthalmology examination.   Hypothyroidism:  He is taking his medication daily.  He denies any recent changes in energy or weight that are unexplained.   GERD:  He is taking his medication daily as prescribed.  He denies any GERD symptoms and feels his GERD is well controlled.   Anxiety: He is taking the ativan only prn. He denies any side effects from the medication. He feels his anxiety is well controlled and he is happy with his current dose of medication.    Medications and allergies reviewed with patient and updated if appropriate.  Patient Active Problem List   Diagnosis Date Noted  . Rib fractures 01/30/2017  . Cellulitis 01/30/2017  . Rib pain on left side 01/16/2017  . Anxiety 07/12/2016  . Rash and nonspecific skin eruption 01/13/2016  . Urinary frequency 01/13/2016  . Acute medial meniscus tear of left knee 10/28/2015  . History of colonic polyps   . History of cancer tonsil 07/13/2015  . Erectile dysfunction 07/13/2015  . Type 2 diabetes mellitus ([place]) 01/12/2014  . Severe obesity (BMI >= 40) ([place]) 10/08/2013  . Nonspecific abnormal electrocardiogram (ECG) (EKG) 09/22/2012  . OSA (obstructive sleep apnea) 08/08/2011  . Lumbar disc prolapse with compression radiculopathy 05/24/2011  . GERD 01/18/2009  . HYPERPLASIA PROSTATE UNS W/O UR OBST & OTH LUTS 01/18/2009  . Hypothyroidism 03/23/2008  . Hyperlipidemia 03/23/2008  .  Essential hypertension 03/23/2008    Current Outpatient Medications on File Prior to Visit  Medication Sig Dispense Refill  . amLODipine-benazepril (LOTREL) 5-40 MG capsule TAKE 1 CAPSULE BY MOUTH DAILY 90 capsule 3  . aspirin EC 81 MG tablet Take 81 mg by mouth every evening.     Marland Kitchen atenolol (TENORMIN) 100 MG tablet TAKE 1 TABLET BY MOUTH DAILY 90 tablet 1  . cetirizine (ZYRTEC) 10 MG tablet Take 10 mg by mouth daily.    . Doxylamine-Phenylephrine-APAP (SINUS & CONGESTION DAY/NIGHT PO) Take 2 tablets by mouth daily as needed (SINUS HEADACHE).    . fluticasone (FLONASE) 50 MCG/ACT nasal spray Place 2 sprays into the nose as directed. At bedtime (Patient taking differently: Place 2 sprays into the nose at bedtime. ) 16 g 5  . levothyroxine (SYNTHROID, LEVOTHROID) 200 MCG tablet TAKE 1 TABLET BY MOUTH DAILY BEFORE BREAKFAST 90 tablet 1  . LORazepam (ATIVAN) 0.5 MG tablet TAKE 1 TABLET BY MOUTH DAILY AS NEEDED FOR ANXIETY 30 tablet 0  . metFORMIN (GLUCOPHAGE) 500 MG tablet TAKE 1 TABLET BY MOUTH TWICE DAILY WITH MEALS 180 tablet 1  . Multiple Vitamins-Minerals (MULTIVITAMIN WITH MINERALS) tablet Take 1 tablet by mouth daily.     Marland Kitchen OVER THE COUNTER MEDICATION Take 15 mLs by mouth daily. Glucosamine & Chondroitin Liquid    . pantoprazole (PROTONIX) 40 MG tablet TAKE 1 TABLET BY MOUTH DAILY FOR REFLUX 90 tablet 3  . pravastatin (PRAVACHOL) 40 MG tablet TAKE 1  TABLET BY MOUTH DAILY 90 tablet 1  . tolterodine (DETROL LA) 4 MG 24 hr capsule Take 4 mg by mouth every evening.     . triamcinolone (KENALOG) 0.025 % ointment Apply 1 application topically 2 (two) times daily. Do not use from more than two weeks at a time (Patient taking differently: Apply 1 application topically daily as needed (RASH). Do not use from more than two weeks at a time) 30 g 0   No current facility-administered medications on file prior to visit.     Past Medical History:  Diagnosis Date  . Anxiety   . Arthritis    right knee   . Cancer ([place]) 2005   tonsils/Chemo and radiation  . Dysphagia 08/2014  . Dyspnea    with exertion  . Full dentures   . GERD (gastroesophageal reflux disease)   . History of chemotherapy   . History of radiation therapy    neck  . Hyperlipidemia   . Hypertension    states under control with med., has been on med. ~ 5 yr.  . Hypothyroidism   . Non-insulin dependent type 2 diabetes mellitus ([place])   . Posterior neck pain   . Seasonal allergies   . Sleep apnea    uses CPAP nightly    Past Surgical History:  Procedure Laterality Date  . COLONOSCOPY  2012  . COLONOSCOPY WITH PROPOFOL N/A 10/04/2015   Procedure: COLONOSCOPY WITH PROPOFOL;  Surgeon: Doran Stabler, MD;  Location: [facility];  Service: Gastroenterology;  Laterality: N/A;  . DIRECT LARYNGOSCOPY N/A 08/20/2014   Procedure: DIRECT LARYNGOSCOPY;  Surgeon: Rozetta Nunnery, MD;  Location: [facility];  Service: ENT;  Laterality: N/A;  . DIRECT LARYNGOSCOPY N/A 08/21/2016   Procedure: DIRECT LARYNGOSCOPY;  Surgeon: Rozetta Nunnery, MD;  Location: [facility];  Service: ENT;  Laterality: N/A;  . ESOPHAGOSCOPY WITH DILITATION N/A 08/20/2014   Procedure: ESOPHAGOSCOPY WITH DILATATION;  Surgeon: Rozetta Nunnery, MD;  Location: [facility];  Service: ENT;  Laterality: N/A;  . ESOPHAGOSCOPY WITH DILITATION N/A 08/21/2016   Procedure: ESOPHAGOSCOPY WITH DILITATION;  Surgeon: Rozetta Nunnery, MD;  Location: [facility];  Service: ENT;  Laterality: N/A;  . KNEE ARTHROPLASTY Right   . KNEE ARTHROSCOPY Right    x 3  . KNEE ARTHROSCOPY Left 10/28/2015   Procedure: ARTHROSCOPY KNEE;  Surgeon: Dorna Leitz, MD;  Location: [facility];  Service: Orthopedics;  Laterality: Left;  left knee partial medial meniscectomy and patellofemoral chondroplasty  . LAPAROSCOPIC GASTRIC BYPASS  2004  . LUMBAR LAMINECTOMY/DECOMPRESSION MICRODISCECTOMY  05/24/2011   Procedure: LUMBAR LAMINECTOMY/DECOMPRESSION MICRODISCECTOMY;   Surgeon: Winfield Cunas, MD;  Location: [facility];  Service: Neurosurgery;  Laterality: Right;  RIGHT L5-S1 Laminectomy, discectomy  . LYMPHADENECTOMY Right    cervical  . MULTIPLE TOOTH EXTRACTIONS    . PORT-A-CATH REMOVAL    . PORTACATH PLACEMENT    . REPLACEMENT TOTAL KNEE Right 03/22/2004  . SHOULDER ARTHROSCOPY Left   . SHOULDER OPEN ROTATOR CUFF REPAIR Right     Social History   Socioeconomic History  . Marital status: Married    Spouse name: [name]  . Number of children: Not on file  . Years of education: Not on file  . Highest education level: Not on file  Occupational History  . Occupation: Information systems manager: [employer]  Social Needs  . Financial resource strain: Not on file  . Food insecurity:  Worry: Not on file    Inability: Not on file  . Transportation needs:    Medical: Not on file    Non-medical: Not on file  Tobacco Use  . Smoking status: Former Smoker    Packs/day: 0.00    Years: 25.00    Pack years: 0.00    Last attempt to quit: 10/25/2002    Years since quitting: 15.2  . Smokeless tobacco: Never Used  Substance and Sexual Activity  . Alcohol use: No    Alcohol/week: 3.0 - 4.0 standard drinks    Types: 3 - 4 Standard drinks or equivalent per week    Comment: 08/17/16- " no desire in past 3- 4 months  . Drug use: No  . Sexual activity: Yes  Lifestyle  . Physical activity:    Days per week: Not on file    Minutes per session: Not on file  . Stress: Not on file  Relationships  . Social connections:    Talks on phone: Not on file    Gets together: Not on file    Attends religious service: Not on file    Active member of club or organization: Not on file    Attends meetings of clubs or organizations: Not on file    Relationship status: Not on file  Other Topics Concern  . Not on file  Social History Narrative  . Not on file    Family History  Problem Relation Age of Onset  . Colon cancer Paternal Uncle   . Heart  attack Maternal Uncle         X 2; both pre 55  . Hypertension Mother   . Breast cancer Mother   . Coronary artery disease Mother   . Brain cancer Paternal Uncle   . Diabetes Maternal Grandfather   . Hypertension Maternal Grandfather        ? metastatic; ? primary  . Diabetes Father   . Stroke Father 45    Review of Systems  Constitutional: Negative for chills and fever.  Respiratory: Negative for cough, shortness of breath and wheezing.   Cardiovascular: Positive for leg swelling (RLE > LLE). Negative for chest pain and palpitations.  Neurological: Positive for headaches (sinus ). Negative for light-headedness.       Objective:   Vitals:   01/21/18 0923  BP: (!) 150/94  Pulse: 78  Resp: 18  Temp: 97.9 F (36.6 C)  SpO2: 96%   BP Readings from Last 3 Encounters:  01/21/18 (!) 150/94  07/18/17 130/80  06/04/17 (!) 142/92   Wt Readings from Last 3 Encounters:  01/21/18 (!) 355 lb (161 kg)  07/18/17 (!) 363 lb (164.7 kg)  06/04/17 (!) 379 lb (171.9 kg)   Body mass index is 48.82 kg/m.   Physical Exam    Constitutional: Appears well-developed and well-nourished. No distress.  HENT:  Head: Normocephalic and atraumatic.  Neck: Neck supple. No tracheal deviation present. No thyromegaly present.  No cervical lymphadenopathy Cardiovascular: Normal rate, regular rhythm and normal heart sounds.   No murmur heard. No carotid bruit .  B/l LE trace edema Pulmonary/Chest: Effort normal and breath sounds normal. No respiratory distress. No has no wheezes. No rales.  Skin: Skin is warm and dry. Not diaphoretic.  Psychiatric: Normal mood and affect. Behavior is normal.      Assessment & Plan:    See Problem List for Assessment and Plan of chronic medical problems.

## 2018-01-20 NOTE — Patient Instructions (Addendum)
  Test(s) ordered today. Your results will be released to [facility] (or called to you) after review, usually within 72hours after test completion. If any changes need to be made, you will be notified at that same time.  Flu immunization administered today.    Medications reviewed and updated.  Changes include starting ozempic injection once a week and we will decrease the metformin to once a day.   Your prescription(s) have been submitted to your pharmacy. Please take as directed and contact our office if you believe you are having problem(s) with the medication(s).  Please followup in 6 months

## 2018-01-21 ENCOUNTER — Ambulatory Visit: Payer: BLUE CROSS/BLUE SHIELD | Admitting: Internal Medicine

## 2018-01-21 ENCOUNTER — Encounter: Payer: Self-pay | Admitting: Internal Medicine

## 2018-01-21 ENCOUNTER — Other Ambulatory Visit (INDEPENDENT_AMBULATORY_CARE_PROVIDER_SITE_OTHER): Payer: BLUE CROSS/BLUE SHIELD

## 2018-01-21 VITALS — BP 150/94 | HR 78 | Temp 97.9°F | Resp 18 | Ht 71.5 in | Wt 355.0 lb

## 2018-01-21 DIAGNOSIS — Z125 Encounter for screening for malignant neoplasm of prostate: Secondary | ICD-10-CM

## 2018-01-21 DIAGNOSIS — E1141 Type 2 diabetes mellitus with diabetic mononeuropathy: Secondary | ICD-10-CM

## 2018-01-21 DIAGNOSIS — K219 Gastro-esophageal reflux disease without esophagitis: Secondary | ICD-10-CM | POA: Diagnosis not present

## 2018-01-21 DIAGNOSIS — Z23 Encounter for immunization: Secondary | ICD-10-CM

## 2018-01-21 DIAGNOSIS — E7849 Other hyperlipidemia: Secondary | ICD-10-CM

## 2018-01-21 DIAGNOSIS — I10 Essential (primary) hypertension: Secondary | ICD-10-CM

## 2018-01-21 DIAGNOSIS — E039 Hypothyroidism, unspecified: Secondary | ICD-10-CM

## 2018-01-21 DIAGNOSIS — F419 Anxiety disorder, unspecified: Secondary | ICD-10-CM

## 2018-01-21 LAB — LIPID PANEL
CHOL/HDL RATIO: 4
Cholesterol: 129 mg/dL (ref 0–200)
HDL: 30.1 mg/dL — ABNORMAL LOW (ref >39.00)
LDL Cholesterol: 62 mg/dL (ref 0–99)
NONHDL: 99.05
TRIGLYCERIDES: 184 mg/dL — AB (ref 0.0–149.0)
VLDL: 36.8 mg/dL (ref 0.0–40.0)

## 2018-01-21 LAB — COMPREHENSIVE METABOLIC PANEL
ALBUMIN: 4 g/dL (ref 3.5–5.2)
ALK PHOS: 66 U/L (ref 39–117)
ALT: 15 U/L (ref 0–53)
AST: 16 U/L (ref 0–37)
BILIRUBIN TOTAL: 0.5 mg/dL (ref 0.2–1.2)
BUN: 24 mg/dL — ABNORMAL HIGH (ref 6–23)
CALCIUM: 9.8 mg/dL (ref 8.4–10.5)
CO2: 26 meq/L (ref 19–32)
Chloride: 101 mEq/L (ref 96–112)
Creatinine, Ser: 1.22 mg/dL (ref 0.40–1.50)
GFR: 64.57 mL/min (ref >60.00)
Glucose, Bld: 131 mg/dL — ABNORMAL HIGH (ref 70–99)
Potassium: 5.2 mEq/L — ABNORMAL HIGH (ref 3.5–5.1)
Sodium: 137 mEq/L (ref 135–145)
TOTAL PROTEIN: 7.7 g/dL (ref 6.0–8.3)

## 2018-01-21 LAB — TSH: TSH: 0.63 u[IU]/mL (ref 0.35–4.50)

## 2018-01-21 LAB — HEMOGLOBIN A1C: HEMOGLOBIN A1C: 7 % — AB (ref 4.6–6.5)

## 2018-01-21 MED ORDER — SEMAGLUTIDE(0.25 OR 0.5MG/DOS) 2 MG/1.5ML ~~LOC~~ SOPN: PEN_INJECTOR | SUBCUTANEOUS | 5 refills | Status: AC

## 2018-01-21 MED ORDER — METFORMIN HCL 500 MG PO TABS: 500.0000 mg | ORAL_TABLET | Freq: Every day | ORAL | 1 refills | Status: DC

## 2018-01-21 NOTE — Assessment & Plan Note (Signed)
Check lipid panel  Continue daily statin Regular exercise and healthy diet encouraged  

## 2018-01-21 NOTE — Assessment & Plan Note (Signed)
Check A1c Continue weight loss efforts Will add Ozempic to maintain good sugar control and also aid in weight loss Decrease metformin to once daily since we are adding a second medication

## 2018-01-21 NOTE — Assessment & Plan Note (Signed)
Due for prostate cancer screening PSA ordered

## 2018-01-21 NOTE — Assessment & Plan Note (Signed)
GERD controlled Continue daily medication  

## 2018-01-21 NOTE — Addendum Note (Signed)
Addended by: Delice Bison E on: 01/21/2018 01:50 PM   Modules accepted: Orders

## 2018-01-21 NOTE — Assessment & Plan Note (Signed)
Blood pressure slightly elevated here today Working on weight loss Encouraged increased exercise We will continue to monitor blood pressure-no changes of medication today CMP

## 2018-01-21 NOTE — Assessment & Plan Note (Signed)
Clinically euthyroid Check tsh  Titrate med dose if needed  

## 2018-01-21 NOTE — Assessment & Plan Note (Signed)
Controlled with as needed lorazepam Continue

## 2018-01-22 ENCOUNTER — Encounter: Payer: Self-pay | Admitting: Internal Medicine

## 2018-01-22 LAB — PSA, TOTAL AND FREE
PSA, % FREE: 25 % — AB (ref >25)
PSA, FREE: 0.1 ng/mL
PSA, TOTAL: 0.4 ng/mL (ref <=4.0)

## 2018-01-27 ENCOUNTER — Other Ambulatory Visit: Payer: Self-pay | Admitting: Internal Medicine

## 2018-02-10 ENCOUNTER — Other Ambulatory Visit: Payer: Self-pay | Admitting: Internal Medicine

## 2018-03-20 ENCOUNTER — Other Ambulatory Visit: Payer: Self-pay | Admitting: Internal Medicine

## 2018-04-17 DIAGNOSIS — G4733 Obstructive sleep apnea (adult) (pediatric): Secondary | ICD-10-CM | POA: Diagnosis not present

## 2018-04-28 DIAGNOSIS — R350 Frequency of micturition: Secondary | ICD-10-CM | POA: Diagnosis not present

## 2018-04-28 DIAGNOSIS — N5201 Erectile dysfunction due to arterial insufficiency: Secondary | ICD-10-CM | POA: Diagnosis not present

## 2018-04-28 DIAGNOSIS — R3915 Urgency of urination: Secondary | ICD-10-CM | POA: Diagnosis not present

## 2018-06-03 ENCOUNTER — Encounter: Payer: Self-pay | Admitting: Pulmonary Disease

## 2018-06-03 ENCOUNTER — Ambulatory Visit: Payer: BLUE CROSS/BLUE SHIELD | Admitting: Pulmonary Disease

## 2018-06-03 DIAGNOSIS — G4733 Obstructive sleep apnea (adult) (pediatric): Secondary | ICD-10-CM | POA: Diagnosis not present

## 2018-06-03 NOTE — Assessment & Plan Note (Signed)
Congratulated him on his weight loss.

## 2018-06-03 NOTE — Assessment & Plan Note (Signed)
CPAP is working well on current auto settings. CPAP supplies will be renewed for a year  Weight loss encouraged, compliance with goal of at least 4-6 hrs every night is the expectation. Advised against medications with sedative side effects Cautioned against driving when sleepy - understanding that sleepiness will vary on a day to day basis

## 2018-06-03 NOTE — Progress Notes (Signed)
   Subjective:    Patient ID: Patrick Butler, male    DOB: July 02, 1960, 58 y.o.   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 Follow-up    38yr f/u for OSA. States his OSA has been well since last visit.    He is doing well, has adjusted to nasal pillows.  Pressure is fine.  He has a mild leak and will admits that he has to change his questions. No problems with pressure. Good compliance by report. Objective data reviewed which shows great usage more than 8 hours every night, average pressure of 11 cm on auto settings 10 to 16 cm with moderate leak  He has lost 30 pounds over the past year  His co-pay is very high for a specialist about  $150.  Significant tests/ events reviewed  NPSG 08/2011: AHI 21/hr, nasal cpap titrated to 16cm  Review of Systems Patient denies significant dyspnea,cough, hemoptysis,  chest pain, palpitations, pedal edema, orthopnea, paroxysmal nocturnal dyspnea, lightheadedness, nausea, vomiting, abdominal or  leg pains      Objective:   Physical Exam  Gen. Pleasant, obese, in no distress ENT - no lesions, no post nasal drip Neck: No JVD, no thyromegaly, no carotid bruits Lungs: no use of accessory muscles, no dullness to percussion, decreased without rales or rhonchi  Cardiovascular: Rhythm regular, heart sounds  normal, no murmurs or gallops, no peripheral edema Musculoskeletal: No deformities, no cyanosis or clubbing , no tremors       Assessment & Plan:

## 2018-06-03 NOTE — Patient Instructions (Signed)
CPAP is working well on current settings. CPAP supplies will be renewed for a year  Congratulations on weight loss!

## 2018-06-04 ENCOUNTER — Ambulatory Visit: Payer: BLUE CROSS/BLUE SHIELD | Admitting: Adult Health

## 2018-07-04 DIAGNOSIS — L218 Other seborrheic dermatitis: Secondary | ICD-10-CM | POA: Diagnosis not present

## 2018-07-13 ENCOUNTER — Encounter (HOSPITAL_COMMUNITY): Payer: Self-pay | Admitting: Emergency Medicine

## 2018-07-13 ENCOUNTER — Other Ambulatory Visit: Payer: Self-pay

## 2018-07-13 ENCOUNTER — Emergency Department (HOSPITAL_COMMUNITY)
Admission: EM | Admit: 2018-07-13 | Discharge: 2018-07-13 | Disposition: A | Discharge status: Home / Self Care | Payer: BLUE CROSS/BLUE SHIELD | Attending: Emergency Medicine | Admitting: Emergency Medicine

## 2018-07-13 ENCOUNTER — Emergency Department (HOSPITAL_COMMUNITY): Payer: BLUE CROSS/BLUE SHIELD

## 2018-07-13 DIAGNOSIS — Z87891 Personal history of nicotine dependence: Secondary | ICD-10-CM | POA: Insufficient documentation

## 2018-07-13 DIAGNOSIS — Z7984 Long term (current) use of oral hypoglycemic drugs: Secondary | ICD-10-CM | POA: Insufficient documentation

## 2018-07-13 DIAGNOSIS — K625 Hemorrhage of anus and rectum: Secondary | ICD-10-CM | POA: Diagnosis not present

## 2018-07-13 DIAGNOSIS — K529 Noninfective gastroenteritis and colitis, unspecified: Secondary | ICD-10-CM | POA: Diagnosis not present

## 2018-07-13 DIAGNOSIS — E785 Hyperlipidemia, unspecified: Secondary | ICD-10-CM | POA: Insufficient documentation

## 2018-07-13 DIAGNOSIS — Z7982 Long term (current) use of aspirin: Secondary | ICD-10-CM | POA: Insufficient documentation

## 2018-07-13 DIAGNOSIS — E119 Type 2 diabetes mellitus without complications: Secondary | ICD-10-CM | POA: Insufficient documentation

## 2018-07-13 DIAGNOSIS — E039 Hypothyroidism, unspecified: Secondary | ICD-10-CM | POA: Insufficient documentation

## 2018-07-13 DIAGNOSIS — Z79899 Other long term (current) drug therapy: Secondary | ICD-10-CM | POA: Diagnosis not present

## 2018-07-13 DIAGNOSIS — I10 Essential (primary) hypertension: Secondary | ICD-10-CM | POA: Insufficient documentation

## 2018-07-13 DIAGNOSIS — R197 Diarrhea, unspecified: Secondary | ICD-10-CM | POA: Diagnosis not present

## 2018-07-13 LAB — CBC
HCT: 47.8 % (ref 39.0–52.0)
Hemoglobin: 15.3 g/dL (ref 13.0–17.0)
MCH: 27.6 pg (ref 26.0–34.0)
MCHC: 32 g/dL (ref 30.0–36.0)
MCV: 86.1 fL (ref 80.0–100.0)
Platelets: 216 10*3/uL (ref 150–400)
RBC: 5.55 MIL/uL (ref 4.22–5.81)
RDW: 14.1 % (ref 11.5–15.5)
WBC: 23.7 10*3/uL — ABNORMAL HIGH (ref 4.0–10.5)
nRBC: 0 % (ref 0.0–0.2)

## 2018-07-13 LAB — COMPREHENSIVE METABOLIC PANEL
ALT: 18 U/L (ref 0–44)
AST: 20 U/L (ref 15–41)
Albumin: 3.4 g/dL — ABNORMAL LOW (ref 3.5–5.0)
Alkaline Phosphatase: 60 U/L (ref 38–126)
Anion gap: 11 (ref 5–15)
BILIRUBIN TOTAL: 0.7 mg/dL (ref 0.3–1.2)
BUN: 24 mg/dL — ABNORMAL HIGH (ref 6–20)
CO2: 22 mmol/L (ref 22–32)
Calcium: 9.2 mg/dL (ref 8.9–10.3)
Chloride: 103 mmol/L (ref 98–111)
Creatinine, Ser: 1.43 mg/dL — ABNORMAL HIGH (ref 0.61–1.24)
GFR calc Af Amer: >60 mL/min (ref >60)
GFR calc non Af Amer: 53 mL/min — ABNORMAL LOW (ref >60)
Glucose, Bld: 183 mg/dL — ABNORMAL HIGH (ref 70–99)
Potassium: 4.6 mmol/L (ref 3.5–5.1)
Sodium: 136 mmol/L (ref 135–145)
Total Protein: 7.3 g/dL (ref 6.5–8.1)

## 2018-07-13 LAB — URINALYSIS, ROUTINE W REFLEX MICROSCOPIC
Bacteria, UA: NONE SEEN
Bilirubin Urine: NEGATIVE
Glucose, UA: NEGATIVE mg/dL
HGB URINE DIPSTICK: NEGATIVE
Ketones, ur: NEGATIVE mg/dL
LEUKOCYTE UA: NEGATIVE
Nitrite: NEGATIVE
Protein, ur: 30 mg/dL — AB
Specific Gravity, Urine: 1.028 (ref 1.005–1.030)
pH: 5 (ref 5.0–8.0)

## 2018-07-13 LAB — TYPE AND SCREEN
ABO/RH(D): A POS
Antibody Screen: NEGATIVE

## 2018-07-13 LAB — ABO/RH: ABO/RH(D): A POS

## 2018-07-13 LAB — LIPASE, BLOOD: Lipase: 27 U/L (ref 11–51)

## 2018-07-13 LAB — POC OCCULT BLOOD, ED: Fecal Occult Bld: POSITIVE — AB

## 2018-07-13 MED ORDER — METRONIDAZOLE 500 MG PO TABS: 500.0000 mg | ORAL_TABLET | Freq: Two times a day (BID) | ORAL | 0 refills | Status: DC

## 2018-07-13 MED ORDER — DICYCLOMINE HCL 10 MG PO CAPS
10.0000 mg | ORAL_CAPSULE | Freq: Once | ORAL | Status: AC
  Administered 2018-07-13: 10 mg via ORAL
  Filled 2018-07-13: qty 1

## 2018-07-13 MED ORDER — METRONIDAZOLE 500 MG PO TABS: 500.0000 mg | ORAL_TABLET | Freq: Two times a day (BID) | ORAL | 0 refills | Status: AC

## 2018-07-13 MED ORDER — SODIUM CHLORIDE 0.9 % IV BOLUS
1000.0000 mL | Freq: Once | INTRAVENOUS | Status: AC
  Administered 2018-07-13: 1000 mL via INTRAVENOUS

## 2018-07-13 MED ORDER — CIPROFLOXACIN HCL 500 MG PO TABS: 500.0000 mg | ORAL_TABLET | Freq: Two times a day (BID) | ORAL | 0 refills | Status: DC

## 2018-07-13 MED ORDER — FENTANYL CITRATE (PF) 100 MCG/2ML IJ SOLN
25.0000 ug | Freq: Once | INTRAMUSCULAR | Status: AC
  Administered 2018-07-13: 25 ug via INTRAVENOUS
  Filled 2018-07-13: qty 2

## 2018-07-13 MED ORDER — IOHEXOL 300 MG/ML  SOLN
100.0000 mL | Freq: Once | INTRAMUSCULAR | Status: AC | PRN
  Administered 2018-07-13: 100 mL via INTRAVENOUS

## 2018-07-13 MED ORDER — ONDANSETRON HCL 4 MG/2ML IJ SOLN
4.0000 mg | Freq: Once | INTRAMUSCULAR | Status: AC
  Administered 2018-07-13: 4 mg via INTRAVENOUS
  Filled 2018-07-13: qty 2

## 2018-07-13 MED ORDER — CIPROFLOXACIN HCL 500 MG PO TABS: 500.0000 mg | ORAL_TABLET | Freq: Two times a day (BID) | ORAL | 0 refills | Status: AC

## 2018-07-13 NOTE — ED Provider Notes (Signed)
[facility] EMERGENCY DEPARTMENT Provider Note   CSN: [id number] Arrival date & time: 07/13/18  4944    History   Chief Complaint Chief Complaint  Patient presents with  . Rectal Bleeding    HPI Patrick Butler is a 58 y.o. male.     58 y.o male with a PMH Anxiety, CA, GERD presents to the ED with a chief complaint of rectal bleeding along with abdominal pain since yesterday morning.  Patient reports having around 10 bowel movements, describes them as bloody with multiple clots on them.  Patient reports the last meal he had was yesterday which was pancakes which his wife also ate and she is had no symptoms.  Also endorses some nausea but no vomiting.  Patient's history is positive for a previous surgical history of gastric bypass 16 years ago along with tonsillar carcinoma.  Patient reports his last bowel movement was this morning and it was loose.  He denies any fever, chest pain, shortness of breath, urinary symptoms.  Denies any blood thinner use, he is currently on daily aspirin.     Past Medical History:  Diagnosis Date  . Anxiety   . Arthritis    right knee  . Cancer ([place]) 2005   tonsils/Chemo and radiation  . Dysphagia 08/2014  . Dyspnea    with exertion  . Full dentures   . GERD (gastroesophageal reflux disease)   . History of chemotherapy   . History of radiation therapy    neck  . Hyperlipidemia   . Hypertension    states under control with med., has been on med. ~ 5 yr.  . Hypothyroidism   . Non-insulin dependent type 2 diabetes mellitus ([place])   . Posterior neck pain   . Seasonal allergies   . Sleep apnea    uses CPAP nightly    Patient Active Problem List   Diagnosis Date Noted  . Encounter for prostate cancer screening 01/21/2018  . Rib fractures 01/30/2017  . Cellulitis 01/30/2017  . Rib pain on left side 01/16/2017  . Anxiety 07/12/2016  . Rash and nonspecific skin eruption 01/13/2016  . Urinary frequency 01/13/2016  . Acute  medial meniscus tear of left knee 10/28/2015  . History of colonic polyps   . History of cancer tonsil 07/13/2015  . Erectile dysfunction 07/13/2015  . Type 2 diabetes mellitus ([place]) 01/12/2014  . Severe obesity (BMI >= 40) ([place]) 10/08/2013  . Nonspecific abnormal electrocardiogram (ECG) (EKG) 09/22/2012  . OSA (obstructive sleep apnea) 08/08/2011  . Lumbar disc prolapse with compression radiculopathy 05/24/2011  . GERD 01/18/2009  . HYPERPLASIA PROSTATE UNS W/O UR OBST & OTH LUTS 01/18/2009  . Hypothyroidism 03/23/2008  . Hyperlipidemia 03/23/2008  . Essential hypertension 03/23/2008    Past Surgical History:  Procedure Laterality Date  . COLONOSCOPY  2012  . COLONOSCOPY WITH PROPOFOL N/A 10/04/2015   Procedure: COLONOSCOPY WITH PROPOFOL;  Surgeon: Doran Stabler, MD;  Location: [facility];  Service: Gastroenterology;  Laterality: N/A;  . DIRECT LARYNGOSCOPY N/A 08/20/2014   Procedure: DIRECT LARYNGOSCOPY;  Surgeon: Rozetta Nunnery, MD;  Location: [facility];  Service: ENT;  Laterality: N/A;  . DIRECT LARYNGOSCOPY N/A 08/21/2016   Procedure: DIRECT LARYNGOSCOPY;  Surgeon: Rozetta Nunnery, MD;  Location: [place];  Service: ENT;  Laterality: N/A;  . ESOPHAGOSCOPY WITH DILITATION N/A 08/20/2014   Procedure: ESOPHAGOSCOPY WITH DILATATION;  Surgeon: Rozetta Nunnery, MD;  Location: [facility];  Service: ENT;  Laterality: N/A;  . ESOPHAGOSCOPY WITH DILITATION N/A 08/21/2016   Procedure: ESOPHAGOSCOPY WITH DILITATION;  Surgeon: Rozetta Nunnery, MD;  Location: [facility];  Service: ENT;  Laterality: N/A;  . KNEE ARTHROPLASTY Right   . KNEE ARTHROSCOPY Right    x 3  . KNEE ARTHROSCOPY Left 10/28/2015   Procedure: ARTHROSCOPY KNEE;  Surgeon: Dorna Leitz, MD;  Location: [facility];  Service: Orthopedics;  Laterality: Left;  left knee partial medial meniscectomy and patellofemoral chondroplasty  . LAPAROSCOPIC GASTRIC BYPASS  2004  . LUMBAR  LAMINECTOMY/DECOMPRESSION MICRODISCECTOMY  05/24/2011   Procedure: LUMBAR LAMINECTOMY/DECOMPRESSION MICRODISCECTOMY;  Surgeon: Winfield Cunas, MD;  Location: [facility];  Service: Neurosurgery;  Laterality: Right;  RIGHT L5-S1 Laminectomy, discectomy  . LYMPHADENECTOMY Right    cervical  . MULTIPLE TOOTH EXTRACTIONS    . PORT-A-CATH REMOVAL    . PORTACATH PLACEMENT    . REPLACEMENT TOTAL KNEE Right 03/22/2004  . SHOULDER ARTHROSCOPY Left   . SHOULDER OPEN ROTATOR CUFF REPAIR Right         Home Medications    Prior to Admission medications   Medication Sig Start Date End Date Taking? Authorizing Provider  amLODipine-benazepril (LOTREL) 5-40 MG capsule TAKE 1 CAPSULE BY MOUTH DAILY 07/22/17  Yes Burns, Claudina Lick, MD  aspirin EC 81 MG tablet Take 81 mg by mouth every evening.    Yes [provider]  atenolol (TENORMIN) 100 MG tablet TAKE 1 TABLET BY MOUTH DAILY Patient taking differently: Take 100 mg by mouth daily.  02/10/18  Yes Burns, Claudina Lick, MD  cetirizine (ZYRTEC) 10 MG tablet Take 10 mg by mouth daily.   Yes [provider]  fluticasone (FLONASE) 50 MCG/ACT nasal spray Place 2 sprays into the nose as directed. At bedtime Patient taking differently: Place 2 sprays into the nose at bedtime.  11/20/11  Yes Hendricks Limes, MD  ketoconazole (NIZORAL) 2 % shampoo Apply 1 application topically 2 (two) times a week. 07/04/18  Yes [provider]  levothyroxine (SYNTHROID, LEVOTHROID) 200 MCG tablet TAKE 1 TABLET BY MOUTH DAILY BEFORE BREAKFAST Patient taking differently: Take 200 mcg by mouth daily before breakfast.  02/10/18  Yes Burns, Claudina Lick, MD  LORazepam (ATIVAN) 0.5 MG tablet TAKE 1 TABLET BY MOUTH DAILY AS NEEDED FOR ANXIETY Patient taking differently: Take 0.5 mg by mouth daily as needed for anxiety.  06/20/17  Yes Burns, Claudina Lick, MD  metFORMIN (GLUCOPHAGE) 500 MG tablet Take 1 tablet (500 mg total) by mouth daily with breakfast. 01/21/18  Yes Burns, Claudina Lick,  MD  Multiple Vitamins-Minerals (MULTIVITAMIN WITH MINERALS) tablet Take 1 tablet by mouth daily.    Yes [provider]  OVER THE COUNTER MEDICATION Take 15 mLs by mouth daily. Glucosamine & Chondroitin Liquid   Yes [provider]  OZEMPIC, 0.25 OR 0.5 MG/DOSE, 2 MG/1.5ML SOPN Inject 0.5 mg into the skin once a week. Wednesday 06/25/18  Yes [provider]  pantoprazole (PROTONIX) 40 MG tablet TAKE 1 TABLET BY MOUTH DAILY FOR REFLUX Patient taking differently: Take 40 mg by mouth daily.  10/03/17  Yes Burns, Claudina Lick, MD  Phenyleph-Doxylamine-DM-APAP (NYQUIL SEVERE COLD/FLU) 5-6.25-10-325 MG/15ML LIQD Take 15 mLs by mouth at bedtime as needed (cold symptoms).   Yes [provider]  pravastatin (PRAVACHOL) 40 MG tablet TAKE 1 TABLET BY MOUTH DAILY Patient taking differently: Take 40 mg by mouth daily.  01/27/18  Yes Burns, Claudina Lick, MD  tolterodine (DETROL LA) 4 MG 24 hr capsule  Take 4 mg by mouth every evening.    Yes [provider]  triamcinolone (KENALOG) 0.025 % ointment Apply 1 application topically 2 (two) times daily. Do not use from more than two weeks at a time Patient taking differently: Apply 1 application topically daily as needed (RASH). Do not use from more than two weeks at a time 01/13/16  Yes Burns, Claudina Lick, MD  ciprofloxacin (CIPRO) 500 MG tablet Take 1 tablet (500 mg total) by mouth 2 (two) times daily for 7 days. 07/13/18 07/20/18  Janeece Fitting, PA-C  metroNIDAZOLE (FLAGYL) 500 MG tablet Take 1 tablet (500 mg total) by mouth 2 (two) times daily for 7 days. 07/13/18 07/20/18  Janeece Fitting, PA-C    Family History Family History  Problem Relation Age of Onset  . Colon cancer Paternal Uncle   . Heart attack Maternal Uncle         X 2; both pre 55  . Hypertension Mother   . Breast cancer Mother   . Coronary artery disease Mother   . Brain cancer Paternal Uncle   . Diabetes Maternal Grandfather   . Hypertension Maternal Grandfather        ?  metastatic; ? primary  . Diabetes Father   . Stroke Father 39    Social History Social History   Tobacco Use  . Smoking status: Former Smoker    Packs/day: 0.00    Years: 25.00    Pack years: 0.00    Last attempt to quit: 10/25/2002    Years since quitting: 15.7  . Smokeless tobacco: Never Used  Substance Use Topics  . Alcohol use: No    Alcohol/week: 3.0 - 4.0 standard drinks    Types: 3 - 4 Standard drinks or equivalent per week    Comment: 08/17/16- " no desire in past 3- 4 months  . Drug use: No     Allergies   Sulfa antibiotics   Review of Systems Review of Systems  Constitutional: Negative for chills and fever.  HENT: Negative for ear pain and sore throat.   Eyes: Negative for pain and visual disturbance.  Respiratory: Negative for cough and shortness of breath.   Cardiovascular: Negative for chest pain and palpitations.  Gastrointestinal: Positive for abdominal pain, blood in stool, diarrhea and nausea. Negative for vomiting.  Genitourinary: Negative for dysuria and hematuria.  Musculoskeletal: Negative for arthralgias and back pain.  Skin: Negative for color change and rash.  Neurological: Negative for seizures and syncope.  All other systems reviewed and are negative.    Physical Exam Updated Vital Signs BP 114/77   Pulse (!) 105   Temp 97.8 F (36.6 C) (Oral)   Resp (!) 23   SpO2 96%   Physical Exam Vitals signs and nursing note reviewed.  Constitutional:      Appearance: He is well-developed. He is obese. He is not ill-appearing.  HENT:     Head: Normocephalic and atraumatic.  Eyes:     General: No scleral icterus.    Pupils: Pupils are equal, round, and reactive to light.  Neck:     Musculoskeletal: Normal range of motion.  Cardiovascular:     Rate and Rhythm: Regular rhythm. Tachycardia present.     Heart sounds: Normal heart sounds.  Pulmonary:     Effort: Pulmonary effort is normal.     Breath sounds: Normal breath sounds. No  wheezing.  Chest:     Chest wall: No tenderness.  Abdominal:     General:  Bowel sounds are normal. There is no distension.     Palpations: Abdomen is soft.     Tenderness: There is abdominal tenderness. There is no right CVA tenderness or left CVA tenderness.     Comments: Generalized tenderness mostly present on lower abdomen region.  Genitourinary:    Rectum: Guaiac result positive. No mass, anal fissure, external hemorrhoid or internal hemorrhoid. Normal anal tone.     Comments: Rectal exam performed with RN Raquel Sarna at the bedside.  Bloody in nature. Musculoskeletal:        General: No tenderness or deformity.  Skin:    General: Skin is warm and dry.  Neurological:     Mental Status: He is alert and oriented to person, place, and time.      ED Treatments / Results  Labs (all labs ordered are listed, but only abnormal results are displayed) Labs Reviewed  COMPREHENSIVE METABOLIC PANEL - Abnormal; Notable for the following components:      Result Value   Glucose, Bld 183 (*)    BUN 24 (*)    Creatinine, Ser 1.43 (*)    Albumin 3.4 (*)    GFR calc non Af Amer 53 (*)    All other components within normal limits  CBC - Abnormal; Notable for the following components:   WBC 23.7 (*)    All other components within normal limits  URINALYSIS, ROUTINE W REFLEX MICROSCOPIC - Abnormal; Notable for the following components:   Color, Urine AMBER (*)    APPearance HAZY (*)    Protein, ur 30 (*)    All other components within normal limits  POC OCCULT BLOOD, ED - Abnormal; Notable for the following components:   Fecal Occult Bld POSITIVE (*)    All other components within normal limits  LIPASE, BLOOD  POC OCCULT BLOOD, ED  TYPE AND SCREEN  ABO/RH    EKG None  Radiology Ct Abdomen Pelvis W Contrast  Result Date: 07/13/2018 CLINICAL DATA:  Crampy abdominal pain, diarrhea EXAM: CT ABDOMEN AND PELVIS WITHOUT AND WITH CONTRAST TECHNIQUE: Multidetector CT imaging of the abdomen and  pelvis was performed following the standard protocol before and following the bolus administration of intravenous contrast. CONTRAST:  117mL OMNIPAQUE IOHEXOL 300 MG/ML  SOLN COMPARISON:  01/29/2006 FINDINGS: Lower chest: Minor dependent basilar atelectasis. Normal heart size. No pericardial or pleural effusion. Degenerative changes of the lower thoracic spine. Hepatobiliary: No focal liver abnormality is seen. No gallstones, gallbladder wall thickening, or biliary dilatation. Pancreas: Unremarkable. No pancreatic ductal dilatation or surrounding inflammatory changes. Spleen: Spleen is normal in size. Trace perisplenic free fluid noted in the left upper quadrant. Adrenals/Urinary Tract: Stable hypodense adrenal nodularity bilaterally, suspect adenomas. No acute renal obstruction, hydronephrosis, hydroureter, or ureteral calculus. Small hypodense right kidney cortical cyst measures 8 mm. Ureters are symmetric and decompressed. Bladder under distended. Stomach/Bowel: Previous gastric bypass. Stable postoperative appearance of the proximal stomach and the gastrojejunostomy. Negative for bowel obstruction, significant dilatation, ileus, or free air. Appendix not visualized. There is colonic wall thickening and stranding pericolonic edema beginning in the mid transverse colon extending to involve the entire left descending colon compatible with acute colitis. No associated pneumatosis, abscess, obstruction pattern. Vascular/Lymphatic: Aorta is atherosclerotic. Negative for aneurysm. No occlusive process. Mesenteric and renal vasculature remain patent. No significant vascular occlusive disease. No veno-occlusive process. No bulky adenopathy. Reproductive: No significant finding by CT. Other: Trace Peri splenic and pelvic ascites likely related to the acute colitis described above. Intact abdominal wall.  No hernia. Musculoskeletal: Degenerative changes of the spine. Lower lumbar facet arthropathy. No acute osseous  finding. IMPRESSION: Acute colitis extending from the mid transverse colon involving the entire descending colon to the left lower quadrant. Suspect infectious/inflammatory. Associated perisplenic and pelvic ascites secondary to the acute colitis. Abdominal atherosclerosis without aneurysm or occlusive disease. Electronically Signed   By: Jerilynn Mages.  Shick M.D.   On: 07/13/2018 13:18    Procedures Procedures (including critical care time)  Medications Ordered in ED Medications  sodium chloride 0.9 % bolus 1,000 mL (1,000 mLs Intravenous New Bag/Given 07/13/18 1139)  ondansetron (ZOFRAN) injection 4 mg (4 mg Intravenous Given 07/13/18 1139)  dicyclomine (BENTYL) capsule 10 mg (10 mg Oral Given 07/13/18 1106)  iohexol (OMNIPAQUE) 300 MG/ML solution 100 mL (100 mLs Intravenous Contrast Given 07/13/18 1205)  fentaNYL (SUBLIMAZE) injection 25 mcg (25 mcg Intravenous Given 07/13/18 1309)     Initial Impression / Assessment and Plan / ED Course  I have reviewed the triage vital signs and the nursing notes.  Pertinent labs & imaging results that were available during my care of the patient were reviewed by me and considered in my medical decision making (see chart for details).       Patient prior is history of gastric bypass presents to the ED with abdominal pain, diarrhea x24 hours with bloody stools.  Reports noticing multiple clots during his stool, has continued to worsen.  Patient reports having a homemade pizza the day prior to, reports his last meal was yesterday morning some pancakes and has not eaten much but has had multiple episodes of diarrhea with bloody stools.  Denies any fever, dizziness.  Patient is a previous history of gastric bypass.  CBC showed a elevation in white blood cell count at 23.7, hemoglobin remains unremarkable.  CMP showed a slight elevation on creatinine compared to his last visit despite this is due to dehydration.  UA showed some protein in the urine.  Hemoccult was positive, vital  signs have remained stable during visit pressures were soft on arrival but have improved with fluid resuscitation.  Differential diagnoses include diverticulitis, viral illness, partial obstruction.  Bowel sounds are diminished during exam.  Will order CT along with fluid resuscitation and pain medication to help with symptoms. CT abdomen and pelvis showed: Acute colitis extending from the mid transverse colon involving the  entire descending colon to the left lower quadrant. Suspect  infectious/inflammatory.    Associated perisplenic and pelvic ascites secondary to the acute  colitis.     1:58 PM patient has been provided with results of his CT, he is likely suffering from colitis due to his rectal bleeding along with symptoms we will treat as diverticulitis, will be placed on double therapy for Cipro and Flagyl to treat his symptoms.  Patient is unable to take ibuprofen as told by his PCP.  We will have him take Tylenol to help with symptoms.  Patient understands and agrees with management.  Return precautions provided at length with wife at the bedside.  Discharged from the ED with in a stable condition.  Final Clinical Impressions(s) / ED Diagnoses   Final diagnoses:  Acute colitis  Rectal bleeding    ED Discharge Orders         Ordered    ciprofloxacin (CIPRO) 500 MG tablet  2 times daily     07/13/18 1342    metroNIDAZOLE (FLAGYL) 500 MG tablet  2 times daily     07/13/18 1342  Janeece Fitting, PA-C 07/13/18 1359    Carmin Muskrat, MD 07/13/18 [phone]

## 2018-07-13 NOTE — ED Triage Notes (Signed)
Patient reports abd cramping that began yesterday afternoon, he states he began having diarrhea that has now progressed to being bright red. He denies any blood thinners but reports taking 81 mg asa daily.

## 2018-07-13 NOTE — Discharge Instructions (Signed)
I have prescribed antibiotics to treat your colitis, please take 1 tablet of each antibiotic twice a day for the next 7 days.  If you experience worsening bleeding, lightheaded, dizzy please return to the emergency department.  I have also provided a gastroenterologist follow-up please schedule an appointment for further management of resolution of your symptoms.

## 2018-07-17 ENCOUNTER — Other Ambulatory Visit: Payer: Self-pay | Admitting: Internal Medicine

## 2018-07-22 NOTE — Progress Notes (Signed)
Subjective:    Patient ID: Patrick Butler, male    DOB: 17-May-1960, 58 y.o.   MRN: [id number]  HPI The patient is here for follow up.  Hypertension: He is taking his medication daily. He is compliant with a low sodium diet.  He denies chest pain, palpitations, edema, shortness of breath and regular headaches. He is not exercising regularly.  He does not monitor his blood pressure at home.    Diabetes: We started Ozempic at his last visit.  He is taking his medication daily as prescribed. He is compliant with a diabetic diet. He is not exercising regularly.  He has cut down on how much he is eating and is losing weight.  His sugars have been controlled at home.   He checks his feet daily and denies foot lesions. He is up-to-date with an ophthalmology examination.   Hyperlipidemia: He is taking his medication daily. He is compliant with a low fat/cholesterol diet. He is not exercising regularly. He denies myalgias.   Hypothyroidism:  He is taking his medication daily.  He denies any recent changes in energy or weight that are unexplained.   Anxiety: He is taking  the Ativan daily only as needed. He denies any side effects from the medication. He feels his anxiety is well controlled and he is happy with his current dose of medication.   GERD:  He is taking his medication daily as prescribed.  He denies any GERD symptoms and feels his GERD is well controlled.   Colitis:  About 10 days ago he went to the ED for abdominal pain, diarrhea with blood.  He was diagnosed with colitis and was cipro/flagyl. He finished them three days ago.  He had 4-5 episodes of diarrhea yesterday.  He denies abdominal pain.  He denies any blood in the stool.  He denies fevers or chills and feels much better.  Medications and allergies reviewed with patient and updated if appropriate.  Patient Active Problem List   Diagnosis Date Noted  . Colitis 07/23/2018  . Encounter for prostate cancer screening 01/21/2018  .  Rib fractures 01/30/2017  . Cellulitis 01/30/2017  . Rib pain on left side 01/16/2017  . Anxiety 07/12/2016  . Rash and nonspecific skin eruption 01/13/2016  . Urinary frequency 01/13/2016  . Acute medial meniscus tear of left knee 10/28/2015  . History of colonic polyps   . History of cancer tonsil 07/13/2015  . Erectile dysfunction 07/13/2015  . Type 2 diabetes mellitus ([place]) 01/12/2014  . Severe obesity (BMI >= 40) ([place]) 10/08/2013  . Nonspecific abnormal electrocardiogram (ECG) (EKG) 09/22/2012  . OSA (obstructive sleep apnea) 08/08/2011  . Lumbar disc prolapse with compression radiculopathy 05/24/2011  . GERD 01/18/2009  . HYPERPLASIA PROSTATE UNS W/O UR OBST & OTH LUTS 01/18/2009  . Hypothyroidism 03/23/2008  . Hyperlipidemia 03/23/2008  . Essential hypertension 03/23/2008    Current Outpatient Medications on File Prior to Visit  Medication Sig Dispense Refill  . amLODipine-benazepril (LOTREL) 5-40 MG capsule TAKE 1 CAPSULE BY MOUTH DAILY 90 capsule 1  . aspirin EC 81 MG tablet Take 81 mg by mouth every evening.     Marland Kitchen atenolol (TENORMIN) 100 MG tablet TAKE 1 TABLET BY MOUTH DAILY (Patient taking differently: Take 100 mg by mouth daily. ) 90 tablet 1  . cetirizine (ZYRTEC) 10 MG tablet Take 10 mg by mouth daily.    . fluticasone (FLONASE) 50 MCG/ACT nasal spray Place 2 sprays into the nose as directed. At bedtime (  Patient taking differently: Place 2 sprays into the nose at bedtime. ) 16 g 5  . ketoconazole (NIZORAL) 2 % shampoo Apply 1 application topically 2 (two) times a week.    . levothyroxine (SYNTHROID, LEVOTHROID) 200 MCG tablet TAKE 1 TABLET BY MOUTH DAILY BEFORE BREAKFAST (Patient taking differently: Take 200 mcg by mouth daily before breakfast. ) 90 tablet 1  . LORazepam (ATIVAN) 0.5 MG tablet TAKE 1 TABLET BY MOUTH DAILY AS NEEDED FOR ANXIETY (Patient taking differently: Take 0.5 mg by mouth daily as needed for anxiety. ) 30 tablet 0  . metFORMIN (GLUCOPHAGE) 500 MG  tablet Take 1 tablet (500 mg total) by mouth daily with breakfast. 90 tablet 1  . Multiple Vitamins-Minerals (MULTIVITAMIN WITH MINERALS) tablet Take 1 tablet by mouth daily.     Marland Kitchen OVER THE COUNTER MEDICATION Take 15 mLs by mouth daily. Glucosamine & Chondroitin Liquid    . OZEMPIC, 0.25 OR 0.5 MG/DOSE, 2 MG/1.5ML SOPN Inject 0.5 mg into the skin once a week. Wednesday    . pantoprazole (PROTONIX) 40 MG tablet TAKE 1 TABLET BY MOUTH DAILY FOR REFLUX (Patient taking differently: Take 40 mg by mouth daily. ) 90 tablet 3  . pravastatin (PRAVACHOL) 40 MG tablet TAKE 1 TABLET BY MOUTH DAILY (Patient taking differently: Take 40 mg by mouth daily. ) 90 tablet 1  . tolterodine (DETROL LA) 4 MG 24 hr capsule Take 4 mg by mouth every evening.     . triamcinolone (KENALOG) 0.025 % ointment Apply 1 application topically 2 (two) times daily. Do not use from more than two weeks at a time (Patient taking differently: Apply 1 application topically daily as needed (RASH). Do not use from more than two weeks at a time) 30 g 0   No current facility-administered medications on file prior to visit.     Past Medical History:  Diagnosis Date  . Anxiety   . Arthritis    right knee  . Cancer ([place]) 2005   tonsils/Chemo and radiation  . Dysphagia 08/2014  . Dyspnea    with exertion  . Full dentures   . GERD (gastroesophageal reflux disease)   . History of chemotherapy   . History of radiation therapy    neck  . Hyperlipidemia   . Hypertension    states under control with med., has been on med. ~ 5 yr.  . Hypothyroidism   . Non-insulin dependent type 2 diabetes mellitus ([place])   . Posterior neck pain   . Seasonal allergies   . Sleep apnea    uses CPAP nightly    Past Surgical History:  Procedure Laterality Date  . COLONOSCOPY  2012  . COLONOSCOPY WITH PROPOFOL N/A 10/04/2015   Procedure: COLONOSCOPY WITH PROPOFOL;  Surgeon: Doran Stabler, MD;  Location: [facility];  Service: Gastroenterology;   Laterality: N/A;  . DIRECT LARYNGOSCOPY N/A 08/20/2014   Procedure: DIRECT LARYNGOSCOPY;  Surgeon: Rozetta Nunnery, MD;  Location: [facility];  Service: ENT;  Laterality: N/A;  . DIRECT LARYNGOSCOPY N/A 08/21/2016   Procedure: DIRECT LARYNGOSCOPY;  Surgeon: Rozetta Nunnery, MD;  Location: [facility];  Service: ENT;  Laterality: N/A;  . ESOPHAGOSCOPY WITH DILITATION N/A 08/20/2014   Procedure: ESOPHAGOSCOPY WITH DILATATION;  Surgeon: Rozetta Nunnery, MD;  Location: [facility] [facility];  Service: ENT;  Laterality: N/A;  . ESOPHAGOSCOPY WITH DILITATION N/A 08/21/2016   Procedure: ESOPHAGOSCOPY WITH DILITATION;  Surgeon: Rozetta Nunnery, MD;  Location: [facility];  Service:  ENT;  Laterality: N/A;  . KNEE ARTHROPLASTY Right   . KNEE ARTHROSCOPY Right    x 3  . KNEE ARTHROSCOPY Left 10/28/2015   Procedure: ARTHROSCOPY KNEE;  Surgeon: Dorna Leitz, MD;  Location: [facility];  Service: Orthopedics;  Laterality: Left;  left knee partial medial meniscectomy and patellofemoral chondroplasty  . LAPAROSCOPIC GASTRIC BYPASS  2004  . LUMBAR LAMINECTOMY/DECOMPRESSION MICRODISCECTOMY  05/24/2011   Procedure: LUMBAR LAMINECTOMY/DECOMPRESSION MICRODISCECTOMY;  Surgeon: Winfield Cunas, MD;  Location: [facility];  Service: Neurosurgery;  Laterality: Right;  RIGHT L5-S1 Laminectomy, discectomy  . LYMPHADENECTOMY Right    cervical  . MULTIPLE TOOTH EXTRACTIONS    . PORT-A-CATH REMOVAL    . PORTACATH PLACEMENT    . REPLACEMENT TOTAL KNEE Right 03/22/2004  . SHOULDER ARTHROSCOPY Left   . SHOULDER OPEN ROTATOR CUFF REPAIR Right     Social History   Socioeconomic History  . Marital status: Married    Spouse name: [name]  . Number of children: Not on file  . Years of education: Not on file  . Highest education level: Not on file  Occupational History  . Occupation: Information systems manager: [employer]  Social Needs  . Financial resource strain: Not on file  . Food  insecurity:    Worry: Not on file    Inability: Not on file  . Transportation needs:    Medical: Not on file    Non-medical: Not on file  Tobacco Use  . Smoking status: Former Smoker    Packs/day: 0.00    Years: 25.00    Pack years: 0.00    Last attempt to quit: 10/25/2002    Years since quitting: 15.7  . Smokeless tobacco: Never Used  Substance and Sexual Activity  . Alcohol use: No    Alcohol/week: 3.0 - 4.0 standard drinks    Types: 3 - 4 Standard drinks or equivalent per week    Comment: 08/17/16- " no desire in past 3- 4 months  . Drug use: No  . Sexual activity: Yes  Lifestyle  . Physical activity:    Days per week: Not on file    Minutes per session: Not on file  . Stress: Not on file  Relationships  . Social connections:    Talks on phone: Not on file    Gets together: Not on file    Attends religious service: Not on file    Active member of club or organization: Not on file    Attends meetings of clubs or organizations: Not on file    Relationship status: Not on file  Other Topics Concern  . Not on file  Social History Narrative  . Not on file    Family History  Problem Relation Age of Onset  . Colon cancer Paternal Uncle   . Heart attack Maternal Uncle         X 2; both pre 55  . Hypertension Mother   . Breast cancer Mother   . Coronary artery disease Mother   . Brain cancer Paternal Uncle   . Diabetes Maternal Grandfather   . Hypertension Maternal Grandfather        ? metastatic; ? primary  . Diabetes Father   . Stroke Father 45    Review of Systems  Constitutional: Negative for appetite change, chills and fever.  Respiratory: Negative for cough, shortness of breath and wheezing.   Cardiovascular: Negative for chest pain, palpitations and leg swelling.  Gastrointestinal: Positive for  diarrhea. Negative for abdominal pain, blood in stool and nausea.  Neurological: Positive for dizziness (occ). Negative for light-headedness and headaches.        Objective:   Vitals:   07/23/18 0911  BP: (!) 162/94  Pulse: 93  Resp: 16  Temp: 98.2 F (36.8 C)  SpO2: 95%   BP Readings from Last 3 Encounters:  07/23/18 (!) 162/94  07/13/18 (!) 111/59  06/03/18 140/84   Wt Readings from Last 3 Encounters:  07/23/18 (!) 336 lb (152.4 kg)  06/03/18 (!) 348 lb 4.8 oz (158 kg)  01/21/18 (!) 355 lb (161 kg)   Body mass index is 46.21 kg/m.   Physical Exam    Constitutional: Appears well-developed and well-nourished. No distress.  HENT:  Head: Normocephalic and atraumatic.  Neck: Neck supple. No tracheal deviation present. No thyromegaly present.  No cervical lymphadenopathy Cardiovascular: Normal rate, regular rhythm and normal heart sounds.   No murmur heard. No carotid bruit .  No edema Pulmonary/Chest: Effort normal and breath sounds normal. No respiratory distress. No has no wheezes. No rales.  Abdomen: Obese, soft, nontender Skin: Skin is warm and dry. Not diaphoretic.  Psychiatric: Normal mood and affect. Behavior is normal.    Diabetic Foot Exam - Simple   Simple Foot Form Diabetic Foot exam was performed with the following findings:  Yes   Visual Inspection No deformities, no ulcerations, no other skin breakdown bilaterally:  Yes Sensation Testing Intact to touch and monofilament testing bilaterally:  Yes Pulse Check Posterior Tibialis and Dorsalis pulse intact bilaterally:  Yes Comments       Assessment & Plan:    See Problem List for Assessment and Plan of chronic medical problems.

## 2018-07-22 NOTE — Patient Instructions (Addendum)
  Tests ordered today. Your results will be released to [facility] (or called to you) after review, usually within 72hours after test completion. If any changes need to be made, you will be notified at that same time.   Tetanus immunization administered today.   Medications reviewed and updated.  Changes include :   Start amlodipine 5 mg daily.  Continue your other medications.    A referral was ordered for GI - they will call you.    Monitor your BP at home.   Please followup in 6 months

## 2018-07-23 ENCOUNTER — Encounter: Payer: Self-pay | Admitting: Internal Medicine

## 2018-07-23 ENCOUNTER — Other Ambulatory Visit: Payer: Self-pay

## 2018-07-23 ENCOUNTER — Ambulatory Visit: Payer: BLUE CROSS/BLUE SHIELD | Admitting: Internal Medicine

## 2018-07-23 ENCOUNTER — Other Ambulatory Visit (INDEPENDENT_AMBULATORY_CARE_PROVIDER_SITE_OTHER): Payer: BLUE CROSS/BLUE SHIELD

## 2018-07-23 VITALS — BP 162/94 | HR 93 | Temp 98.2°F | Resp 16 | Ht 71.5 in | Wt 336.0 lb

## 2018-07-23 DIAGNOSIS — E1141 Type 2 diabetes mellitus with diabetic mononeuropathy: Secondary | ICD-10-CM | POA: Diagnosis not present

## 2018-07-23 DIAGNOSIS — F419 Anxiety disorder, unspecified: Secondary | ICD-10-CM

## 2018-07-23 DIAGNOSIS — I10 Essential (primary) hypertension: Secondary | ICD-10-CM

## 2018-07-23 DIAGNOSIS — Z23 Encounter for immunization: Secondary | ICD-10-CM | POA: Diagnosis not present

## 2018-07-23 DIAGNOSIS — K219 Gastro-esophageal reflux disease without esophagitis: Secondary | ICD-10-CM | POA: Diagnosis not present

## 2018-07-23 DIAGNOSIS — E7849 Other hyperlipidemia: Secondary | ICD-10-CM

## 2018-07-23 DIAGNOSIS — K529 Noninfective gastroenteritis and colitis, unspecified: Secondary | ICD-10-CM | POA: Diagnosis not present

## 2018-07-23 DIAGNOSIS — E039 Hypothyroidism, unspecified: Secondary | ICD-10-CM

## 2018-07-23 LAB — COMPREHENSIVE METABOLIC PANEL
ALT: 22 U/L (ref 0–53)
AST: 14 U/L (ref 0–37)
Albumin: 3.7 g/dL (ref 3.5–5.2)
Alkaline Phosphatase: 55 U/L (ref 39–117)
BUN: 18 mg/dL (ref 6–23)
CO2: 26 mEq/L (ref 19–32)
Calcium: 9.2 mg/dL (ref 8.4–10.5)
Chloride: 101 mEq/L (ref 96–112)
Creatinine, Ser: 1.11 mg/dL (ref 0.40–1.50)
GFR: 67.63 mL/min (ref >60.00)
Glucose, Bld: 122 mg/dL — ABNORMAL HIGH (ref 70–99)
Potassium: 4.7 mEq/L (ref 3.5–5.1)
Sodium: 135 mEq/L (ref 135–145)
Total Bilirubin: 0.3 mg/dL (ref 0.2–1.2)
Total Protein: 7.6 g/dL (ref 6.0–8.3)

## 2018-07-23 LAB — CBC WITH DIFFERENTIAL/PLATELET
Basophils Absolute: 0.1 10*3/uL (ref 0.0–0.1)
Basophils Relative: 0.8 % (ref 0.0–3.0)
EOS ABS: 0.2 10*3/uL (ref 0.0–0.7)
Eosinophils Relative: 1.7 % (ref 0.0–5.0)
HEMATOCRIT: 41.9 % (ref 39.0–52.0)
Hemoglobin: 13.7 g/dL (ref 13.0–17.0)
Lymphocytes Relative: 25.7 % (ref 12.0–46.0)
Lymphs Abs: 3.4 10*3/uL (ref 0.7–4.0)
MCHC: 32.5 g/dL (ref 30.0–36.0)
MCV: 86.4 fl (ref 78.0–100.0)
Monocytes Absolute: 0.9 10*3/uL (ref 0.1–1.0)
Monocytes Relative: 6.6 % (ref 3.0–12.0)
Neutro Abs: 8.7 10*3/uL — ABNORMAL HIGH (ref 1.4–7.7)
Neutrophils Relative %: 65.2 % (ref 43.0–77.0)
Platelets: 372 10*3/uL (ref 150.0–400.0)
RBC: 4.86 Mil/uL (ref 4.22–5.81)
RDW: 15.3 % (ref 11.5–15.5)
WBC: 13.4 10*3/uL — ABNORMAL HIGH (ref 4.0–10.5)

## 2018-07-23 LAB — LIPID PANEL
Cholesterol: 122 mg/dL (ref 0–200)
HDL: 29 mg/dL — AB (ref >39.00)
LDL Cholesterol: 57 mg/dL (ref 0–99)
NONHDL: 93.38
TRIGLYCERIDES: 183 mg/dL — AB (ref 0.0–149.0)
Total CHOL/HDL Ratio: 4
VLDL: 36.6 mg/dL (ref 0.0–40.0)

## 2018-07-23 LAB — TSH: TSH: 3.85 u[IU]/mL (ref 0.35–4.50)

## 2018-07-23 LAB — HEMOGLOBIN A1C: Hgb A1c MFr Bld: 6.7 % — ABNORMAL HIGH (ref 4.6–6.5)

## 2018-07-23 MED ORDER — AMLODIPINE BESYLATE 5 MG PO TABS: 5.0000 mg | ORAL_TABLET | Freq: Every day | ORAL | 3 refills | Status: DC

## 2018-07-23 NOTE — Addendum Note (Signed)
Addended by: Delice Bison E on: 07/23/2018 04:17 PM   Modules accepted: Orders

## 2018-07-23 NOTE — Assessment & Plan Note (Signed)
Sugars controlled at home Check A1c Continue current medications Continue to work on weight loss Ideally start exercising

## 2018-07-23 NOTE — Assessment & Plan Note (Signed)
Clinically euthyroid Check tsh  Titrate med dose if needed  

## 2018-07-23 NOTE — Assessment & Plan Note (Signed)
Taking Ativan as needed only Continue

## 2018-07-23 NOTE — Assessment & Plan Note (Signed)
Blood pressure elevated here today and has been somewhat labile He does not check it regularly, but advised him to start monitoring it more regularly at home Continue current medications Add 5 mg amlodipine daily CMP

## 2018-07-23 NOTE — Assessment & Plan Note (Signed)
GERD controlled Continue daily medication  

## 2018-07-23 NOTE — Assessment & Plan Note (Signed)
Check lipid panel, TSH, CMP Continue daily statin Regular exercise and healthy diet encouraged  

## 2018-07-23 NOTE — Assessment & Plan Note (Signed)
He went to the ED about 10 days ago for abdominal pain, diarrhea with blood and was diagnosed with colitis Likely infectious or inflammatory in nature Prescribed Flagyl/Cipro, which she completed Symptoms much improved Still having some residual diarrhea, but no abdominal pain or blood Due for colonoscopy in May 2020 Will refer to GI for follow-up of colitis and to schedule colonoscopy

## 2018-07-26 ENCOUNTER — Other Ambulatory Visit: Payer: Self-pay | Admitting: Internal Medicine

## 2018-07-31 ENCOUNTER — Ambulatory Visit (INDEPENDENT_AMBULATORY_CARE_PROVIDER_SITE_OTHER): Payer: BLUE CROSS/BLUE SHIELD | Admitting: Gastroenterology

## 2018-07-31 ENCOUNTER — Other Ambulatory Visit: Payer: Self-pay

## 2018-07-31 ENCOUNTER — Encounter: Payer: Self-pay | Admitting: Gastroenterology

## 2018-07-31 DIAGNOSIS — K559 Vascular disorder of intestine, unspecified: Secondary | ICD-10-CM | POA: Diagnosis not present

## 2018-07-31 DIAGNOSIS — K5909 Other constipation: Secondary | ICD-10-CM | POA: Diagnosis not present

## 2018-07-31 DIAGNOSIS — Z86010 Personal history of colonic polyps: Secondary | ICD-10-CM

## 2018-07-31 DIAGNOSIS — K921 Melena: Secondary | ICD-10-CM

## 2018-07-31 NOTE — Progress Notes (Signed)
This patient contacted our office requesting a physician telemedicine video consultation regarding clinical questions and/or test results.  If new patient, they were referred by Billey Gosling, MD (after recent ED visit)  Participants on the phone call myself and the patient  The patient consented to phone consultation and was aware that a charge will be placed through their insurance.  I was in my office and the patient was at home   Encounter time:  Total time 25 minutes, with 20 minutes spent with patient on webex    Wilfrid Lund, MD   _____________________________________________________________________________________________               [facility] Progress Note  Chief Complaint: Colitis  Subjective  History:  Patrick Butler reports having had acute onset of crampy lower abdominal pain followed by bright red blood per rectum on the evening prior to his ED visit.  Symptoms were still going on the next morning, so he came to the ED at the urging of his wife.  For about 2 weeks prior he had been using DayQuil and NyQuil for a persistent head cold, and also some episodes of constipation had occurred with stool that was hard balls with some straining.  He never had anything like this happen before.  Since then, he is slowly returning to normal.  His bowel habits are occasionally soft but mostly formed.  The bleeding stopped a couple days after the acute episode.  He no longer has abdominal pain.  He is feeling somewhat bloated and gassy lately.  His appetite is returned to normal and his weight is stable.  I last saw him for surveillance colonoscopy in May 2017; subcentimeter adenoma removed Primary care clinic visit note reviewed from 07/23/2018  ROS: Cardiovascular:  no chest pain Respiratory: no dyspnea  The patient's Past Medical, Family and Social History were reviewed and are on file in the EMR.  Objective:  Med list reviewed  Current Outpatient Medications:  .   amLODipine (NORVASC) 5 MG tablet, Take 1 tablet (5 mg total) by mouth daily., Disp: 90 tablet, Rfl: 3 .  amLODipine-benazepril (LOTREL) 5-40 MG capsule, TAKE 1 CAPSULE BY MOUTH DAILY, Disp: 90 capsule, Rfl: 1 .  aspirin EC 81 MG tablet, Take 81 mg by mouth every evening. , Disp: , Rfl:  .  atenolol (TENORMIN) 100 MG tablet, TAKE 1 TABLET BY MOUTH DAILY (Patient taking differently: Take 100 mg by mouth daily. ), Disp: 90 tablet, Rfl: 1 .  cetirizine (ZYRTEC) 10 MG tablet, Take 10 mg by mouth daily., Disp: , Rfl:  .  fluticasone (FLONASE) 50 MCG/ACT nasal spray, Place 2 sprays into the nose as directed. At bedtime (Patient taking differently: Place 2 sprays into the nose at bedtime. ), Disp: 16 g, Rfl: 5 .  ketoconazole (NIZORAL) 2 % shampoo, Apply 1 application topically 2 (two) times a week., Disp: , Rfl:  .  levothyroxine (SYNTHROID, LEVOTHROID) 200 MCG tablet, TAKE 1 TABLET BY MOUTH DAILY BEFORE BREAKFAST (Patient taking differently: Take 200 mcg by mouth daily before breakfast. ), Disp: 90 tablet, Rfl: 1 .  LORazepam (ATIVAN) 0.5 MG tablet, TAKE 1 TABLET BY MOUTH DAILY AS NEEDED FOR ANXIETY (Patient taking differently: Take 0.5 mg by mouth daily as needed for anxiety. ), Disp: 30 tablet, Rfl: 0 .  metFORMIN (GLUCOPHAGE) 500 MG tablet, Take 1 tablet (500 mg total) by mouth daily with breakfast., Disp: 90 tablet, Rfl: 1 .  Multiple Vitamins-Minerals (MULTIVITAMIN WITH MINERALS) tablet, Take 1 tablet by mouth  daily. , Disp: , Rfl:  .  OVER THE COUNTER MEDICATION, Take 15 mLs by mouth daily. Glucosamine & Chondroitin Liquid, Disp: , Rfl:  .  OZEMPIC, 0.25 OR 0.5 MG/DOSE, 2 MG/1.5ML SOPN, Inject 0.5 mg into the skin once a week. Wednesday, Disp: , Rfl:  .  pantoprazole (PROTONIX) 40 MG tablet, TAKE 1 TABLET BY MOUTH DAILY FOR REFLUX (Patient taking differently: Take 40 mg by mouth daily. ), Disp: 90 tablet, Rfl: 3 .  pravastatin (PRAVACHOL) 40 MG tablet, Take 1 tablet (40 mg total) by mouth daily., Disp:  90 tablet, Rfl: 1 .  tolterodine (DETROL LA) 4 MG 24 hr capsule, Take 4 mg by mouth every evening. , Disp: , Rfl:  .  triamcinolone (KENALOG) 0.025 % ointment, Apply 1 application topically 2 (two) times daily. Do not use from more than two weeks at a time (Patient taking differently: Apply 1 application topically daily as needed (RASH). Do not use from more than two weeks at a time), Disp: 30 g, Rfl: 0     Recent Labs:  CBC Latest Ref Rng & Units 07/23/2018 07/13/2018 01/16/2017  WBC 4.0 - 10.5 K/uL 13.4(H) 23.7(H) 9.6  Hemoglobin 13.0 - 17.0 g/dL 13.7 15.3 13.8  Hematocrit 39.0 - 52.0 % 41.9 47.8 42.4  Platelets 150.0 - 400.0 K/uL 372.0 216 219.0   CMP Latest Ref Rng & Units 07/23/2018 07/13/2018 01/21/2018  Glucose 70 - 99 mg/dL 122(H) 183(H) 131(H)  BUN 6 - 23 mg/dL 18 24(H) 24(H)  Creatinine 0.40 - 1.50 mg/dL 1.11 1.43(H) 1.22  Sodium 135 - 145 mEq/L 135 136 137  Potassium 3.5 - 5.1 mEq/L 4.7 4.6 5.2(H)  Chloride 96 - 112 mEq/L 101 103 101  CO2 19 - 32 mEq/L 26 22 26   Calcium 8.4 - 10.5 mg/dL 9.2 9.2 9.8  Total Protein 6.0 - 8.3 g/dL 7.6 7.3 7.7  Total Bilirubin 0.2 - 1.2 mg/dL 0.3 0.7 0.5  Alkaline Phos 39 - 117 U/L 55 60 66  AST 0 - 37 U/L 14 20 16   ALT 0 - 53 U/L 22 18 15    No stool studies resulted  Radiologic studies:  CLINICAL DATA:  Crampy abdominal pain, diarrhea   EXAM: CT ABDOMEN AND PELVIS WITHOUT AND WITH CONTRAST   TECHNIQUE: Multidetector CT imaging of the abdomen and pelvis was performed following the standard protocol before and following the bolus administration of intravenous contrast.   CONTRAST:  117mL OMNIPAQUE IOHEXOL 300 MG/ML  SOLN   COMPARISON:  01/29/2006   FINDINGS: Lower chest: Minor dependent basilar atelectasis. Normal heart size. No pericardial or pleural effusion. Degenerative changes of the lower thoracic spine.   Hepatobiliary: No focal liver abnormality is seen. No gallstones, gallbladder wall thickening, or biliary dilatation.    Pancreas: Unremarkable. No pancreatic ductal dilatation or surrounding inflammatory changes.   Spleen: Spleen is normal in size. Trace perisplenic free fluid noted in the left upper quadrant.   Adrenals/Urinary Tract: Stable hypodense adrenal nodularity bilaterally, suspect adenomas.   No acute renal obstruction, hydronephrosis, hydroureter, or ureteral calculus. Small hypodense right kidney cortical cyst measures 8 mm. Ureters are symmetric and decompressed. Bladder under distended.   Stomach/Bowel: Previous gastric bypass. Stable postoperative appearance of the proximal stomach and the gastrojejunostomy.   Negative for bowel obstruction, significant dilatation, ileus, or free air. Appendix not visualized.   There is colonic wall thickening and stranding pericolonic edema beginning in the mid transverse colon extending to involve the entire left descending colon compatible with acute  colitis. No associated pneumatosis, abscess, obstruction pattern.   Vascular/Lymphatic: Aorta is atherosclerotic. Negative for aneurysm. No occlusive process. Mesenteric and renal vasculature remain patent. No significant vascular occlusive disease.   No veno-occlusive process.   No bulky adenopathy.   Reproductive: No significant finding by CT.   Other: Trace Peri splenic and pelvic ascites likely related to the acute colitis described above.   Intact abdominal wall.  No hernia.   Musculoskeletal: Degenerative changes of the spine. Lower lumbar facet arthropathy. No acute osseous finding.   IMPRESSION: Acute colitis extending from the mid transverse colon involving the entire descending colon to the left lower quadrant. Suspect infectious/inflammatory.   Associated perisplenic and pelvic ascites secondary to the acute colitis.   Abdominal atherosclerosis without aneurysm or occlusive disease.     Electronically Signed   By: Jerilynn Mages.  Shick M.D.   On: 07/13/2018 13:18  (I personally  reviewed the scan images.  With standing the lack of oral contrast, the area in question does appear to have significant wall thickening and pericolonic stranding without proximal dilatation)  @ASSESSMENTPLANBEGIN @ Assessment: Encounter Diagnoses  Name Primary?  . Ischemic colitis ([place]) Yes  . Hematochezia   . Personal history of colonic polyps   . Constipation, chronic     This appears to been acute ischemic colitis, perhaps triggered by constipation and/or vasoconstrictive effect of cold medicines (there was pseudoephedrine/phenylephrine). He has underlying risk factors for vascular disease. He has made a near complete recovery at this point, no further testing appears necessary.  Based on recent engine polyp surveillance guidelines, he is not yet due for surveillance colonoscopy.  We will send him a reminder letter at the appropriate time.   [name]

## 2018-08-04 ENCOUNTER — Telehealth: Payer: BLUE CROSS/BLUE SHIELD | Admitting: Gastroenterology

## 2018-08-06 ENCOUNTER — Other Ambulatory Visit: Payer: Self-pay | Admitting: Internal Medicine

## 2018-08-08 ENCOUNTER — Telehealth: Payer: BLUE CROSS/BLUE SHIELD | Admitting: Gastroenterology

## 2018-08-11 ENCOUNTER — Other Ambulatory Visit: Payer: Self-pay | Admitting: Internal Medicine

## 2018-08-18 ENCOUNTER — Other Ambulatory Visit: Payer: Self-pay | Admitting: Internal Medicine

## 2018-09-25 ENCOUNTER — Other Ambulatory Visit: Payer: Self-pay | Admitting: Internal Medicine

## 2018-10-01 ENCOUNTER — Other Ambulatory Visit: Payer: Self-pay | Admitting: Internal Medicine

## 2018-10-14 ENCOUNTER — Telehealth: Payer: Self-pay | Admitting: Gastroenterology

## 2018-10-14 ENCOUNTER — Encounter: Payer: Self-pay | Admitting: Gastroenterology

## 2018-10-14 NOTE — Telephone Encounter (Signed)
Left message for pt to call back. Pts BMI based on last wt is under 50, might be able to have procedure done in the [place]. Calling to confirm weight.

## 2018-10-14 NOTE — Telephone Encounter (Signed)
Pt called to schedule hospital colon.

## 2018-10-16 ENCOUNTER — Encounter: Payer: Self-pay | Admitting: Internal Medicine

## 2018-10-16 MED ORDER — AMLODIPINE BESY-BENAZEPRIL HCL 10-40 MG PO CAPS: 1.0000 | ORAL_CAPSULE | Freq: Every day | ORAL | 1 refills | Status: DC

## 2018-10-17 DIAGNOSIS — G4733 Obstructive sleep apnea (adult) (pediatric): Secondary | ICD-10-CM | POA: Diagnosis not present

## 2018-10-20 NOTE — Telephone Encounter (Signed)
Patient return called. Sated he just received the message.

## 2018-10-20 NOTE — Telephone Encounter (Signed)
Patient notified of the recommendations Recall updated.

## 2018-10-20 NOTE — Telephone Encounter (Signed)
His original recall was for June 2020 (after 2017 exam).  However, with recent revision of guidelines, his recall should be moved to June 2022 (if it has not been changed already).  Apologies for the confusion.

## 2018-10-20 NOTE — Telephone Encounter (Signed)
Per recall he is due for repeat procedure 10/06/18.  Your last office note indicates he was not due at the time. Patient is confused, he believes you told him that he does not need a repeat procedure.  Please advise if recall is in fact due at this time.

## 2018-10-20 NOTE — Telephone Encounter (Signed)
It looks like this is Dr. Loletha Carrow' patient

## 2018-10-20 NOTE — Telephone Encounter (Signed)
Pt returned phone call.  

## 2018-11-03 ENCOUNTER — Other Ambulatory Visit: Payer: Self-pay | Admitting: Internal Medicine

## 2018-11-24 DIAGNOSIS — E113291 Type 2 diabetes mellitus with mild nonproliferative diabetic retinopathy without macular edema, right eye: Secondary | ICD-10-CM | POA: Diagnosis not present

## 2018-11-24 DIAGNOSIS — H5201 Hypermetropia, right eye: Secondary | ICD-10-CM | POA: Diagnosis not present

## 2018-11-24 DIAGNOSIS — H2513 Age-related nuclear cataract, bilateral: Secondary | ICD-10-CM | POA: Diagnosis not present

## 2018-11-24 DIAGNOSIS — H25013 Cortical age-related cataract, bilateral: Secondary | ICD-10-CM | POA: Diagnosis not present

## 2018-11-24 LAB — HM DIABETES EYE EXAM

## 2018-11-26 ENCOUNTER — Other Ambulatory Visit: Payer: Self-pay | Admitting: Internal Medicine

## 2018-11-27 ENCOUNTER — Encounter: Payer: Self-pay | Admitting: Internal Medicine

## 2018-12-25 ENCOUNTER — Other Ambulatory Visit: Payer: Self-pay | Admitting: Internal Medicine

## 2019-01-22 NOTE — Patient Instructions (Addendum)
  Tests ordered today. Your results will be released to [facility] (or called to you) after review.  If any changes need to be made, you will be notified at that same time.   Flu immunization administered today.    Medications reviewed and updated.  Changes include :   Increase ozempic to 1 mg weekly  Your prescription(s) have been submitted to your pharmacy.  I told them to keep this on hold.    Please followup in 6 months

## 2019-01-22 NOTE — Progress Notes (Signed)
Subjective:    Patient ID: Patrick Butler, male    DOB: 1960-05-19, 58 y.o.   MRN: [id number]  HPI The patient is here for follow up.  He is not exercising regularly - he is limited by his joint pain.    Hypertension: He is taking his medication daily. He is compliant with a low sodium diet.  He denies chest pain, palpitations, shortness of breath and regular headaches.     Diabetes: He is taking his medication daily as prescribed. He is compliant with a diabetic diet.  He is trying to lose weight.  He is up-to-date with an ophthalmology examination.   Hyperlipidemia: He is taking his medication daily. He is compliant with a low fat/cholesterol diet. He denies myalgias.   Hypothyroidism:  He is taking his medication daily.  He denies any recent changes in energy or weight that are unexplained. He has hit a plateau with his weight.  GERD:  He is taking his medication daily as prescribed.  He denies any GERD symptoms and feels his GERD is well controlled.   Anxiety: He is taking his Ativan as needed as prescribed. He denies any side effects from the medication. He feels his anxiety is well controlled and he is happy with his current dose of medication.    Medications and allergies reviewed with patient and updated if appropriate.  Patient Active Problem List   Diagnosis Date Noted  . Colitis 07/23/2018  . Encounter for prostate cancer screening 01/21/2018  . Cellulitis 01/30/2017  . Anxiety 07/12/2016  . Rash and nonspecific skin eruption 01/13/2016  . Urinary frequency 01/13/2016  . Acute medial meniscus tear of left knee 10/28/2015  . History of colonic polyps   . History of cancer tonsil 07/13/2015  . Erectile dysfunction 07/13/2015  . Type 2 diabetes mellitus ([place]) 01/12/2014  . Severe obesity (BMI >= 40) ([place]) 10/08/2013  . Nonspecific abnormal electrocardiogram (ECG) (EKG) 09/22/2012  . OSA (obstructive sleep apnea) 08/08/2011  . Lumbar disc prolapse with compression  radiculopathy 05/24/2011  . GERD 01/18/2009  . HYPERPLASIA PROSTATE UNS W/O UR OBST & OTH LUTS 01/18/2009  . Hypothyroidism 03/23/2008  . Hyperlipidemia 03/23/2008  . Essential hypertension 03/23/2008    Current Outpatient Medications on File Prior to Visit  Medication Sig Dispense Refill  . amLODipine-benazepril (LOTREL) 10-40 MG capsule Take 1 capsule by mouth daily. 90 capsule 1  . aspirin EC 81 MG tablet Take 81 mg by mouth every evening.     Marland Kitchen atenolol (TENORMIN) 100 MG tablet Take 1 tablet (100 mg total) by mouth daily. 90 tablet 1  . cetirizine (ZYRTEC) 10 MG tablet Take 10 mg by mouth daily.    . fluticasone (FLONASE) 50 MCG/ACT nasal spray Place 2 sprays into the nose as directed. At bedtime (Patient taking differently: Place 2 sprays into the nose at bedtime. ) 16 g 5  . ketoconazole (NIZORAL) 2 % shampoo Apply 1 application topically 2 (two) times a week.    . levothyroxine (SYNTHROID) 200 MCG tablet TAKE 1 TABLET BY MOUTH DAILY BEFORE BREAKFAST 90 tablet 0  . LORazepam (ATIVAN) 0.5 MG tablet TAKE 1 TABLET BY MOUTH DAILY AS NEEDED FOR ANXIETY 30 tablet 0  . metFORMIN (GLUCOPHAGE) 500 MG tablet Take 1 tablet (500 mg total) by mouth daily with breakfast. 90 tablet 1  . Multiple Vitamins-Minerals (MULTIVITAMIN WITH MINERALS) tablet Take 1 tablet by mouth daily.     Marland Kitchen OVER THE COUNTER MEDICATION Take 15 mLs by mouth  daily. Glucosamine & Chondroitin Liquid    . OZEMPIC, 0.25 OR 0.5 MG/DOSE, 2 MG/1.5ML SOPN INJECT 0.5 MG ONCE WEEKLY 1.5 mL 0  . pantoprazole (PROTONIX) 40 MG tablet Take 1 tablet (40 mg total) by mouth daily. 90 tablet 1  . pravastatin (PRAVACHOL) 40 MG tablet Take 1 tablet (40 mg total) by mouth daily. 90 tablet 1  . tolterodine (DETROL LA) 4 MG 24 hr capsule Take 4 mg by mouth every evening.      No current facility-administered medications on file prior to visit.     Past Medical History:  Diagnosis Date  . Anxiety   . Arthritis    right knee  . Cancer ([place])  2005   tonsils/Chemo and radiation  . Dysphagia 08/2014  . Dyspnea    with exertion  . Full dentures   . GERD (gastroesophageal reflux disease)   . History of chemotherapy   . History of radiation therapy    neck  . Hyperlipidemia   . Hypertension    states under control with med., has been on med. ~ 5 yr.  . Hypothyroidism   . Non-insulin dependent type 2 diabetes mellitus ([place])   . Posterior neck pain   . Seasonal allergies   . Sleep apnea    uses CPAP nightly    Past Surgical History:  Procedure Laterality Date  . COLONOSCOPY  2012  . COLONOSCOPY WITH PROPOFOL N/A 10/04/2015   Procedure: COLONOSCOPY WITH PROPOFOL;  Surgeon: Doran Stabler, MD;  Location: [facility];  Service: Gastroenterology;  Laterality: N/A;  . DIRECT LARYNGOSCOPY N/A 08/20/2014   Procedure: DIRECT LARYNGOSCOPY;  Surgeon: Rozetta Nunnery, MD;  Location: [place];  Service: ENT;  Laterality: N/A;  . DIRECT LARYNGOSCOPY N/A 08/21/2016   Procedure: DIRECT LARYNGOSCOPY;  Surgeon: Rozetta Nunnery, MD;  Location: [place];  Service: ENT;  Laterality: N/A;  . ESOPHAGOSCOPY WITH DILITATION N/A 08/20/2014   Procedure: ESOPHAGOSCOPY WITH DILATATION;  Surgeon: Rozetta Nunnery, MD;  Location: [facility];  Service: ENT;  Laterality: N/A;  . ESOPHAGOSCOPY WITH DILITATION N/A 08/21/2016   Procedure: ESOPHAGOSCOPY WITH DILITATION;  Surgeon: Rozetta Nunnery, MD;  Location: [place];  Service: ENT;  Laterality: N/A;  . KNEE ARTHROPLASTY Right   . KNEE ARTHROSCOPY Right    x 3  . KNEE ARTHROSCOPY Left 10/28/2015   Procedure: ARTHROSCOPY KNEE;  Surgeon: Dorna Leitz, MD;  Location: [facility];  Service: Orthopedics;  Laterality: Left;  left knee partial medial meniscectomy and patellofemoral chondroplasty  . LAPAROSCOPIC GASTRIC BYPASS  2004  . LUMBAR LAMINECTOMY/DECOMPRESSION MICRODISCECTOMY  05/24/2011   Procedure: LUMBAR LAMINECTOMY/DECOMPRESSION MICRODISCECTOMY;  Surgeon: [place] Cunas, MD;  Location: [facility];  Service: Neurosurgery;  Laterality: Right;  RIGHT L5-S1 Laminectomy, discectomy  . LYMPHADENECTOMY Right    cervical  . MULTIPLE TOOTH EXTRACTIONS    . PORT-A-CATH REMOVAL    . PORTACATH PLACEMENT    . REPLACEMENT TOTAL KNEE Right 03/22/2004  . SHOULDER ARTHROSCOPY Left   . SHOULDER OPEN ROTATOR CUFF REPAIR Right     Social History   Socioeconomic History  . Marital status: Married    Spouse name: [name]  . Number of children: Not on file  . Years of education: Not on file  . Highest education level: Not on file  Occupational History  . Occupation: Information systems manager: [employer]  Social Needs  . Financial resource strain: Not on file  .  Food insecurity    Worry: Not on file    Inability: Not on file  . Transportation needs    Medical: Not on file    Non-medical: Not on file  Tobacco Use  . Smoking status: Former Smoker    Packs/day: 0.00    Years: 25.00    Pack years: 0.00    Quit date: 10/25/2002    Years since quitting: 16.2  . Smokeless tobacco: Never Used  Substance and Sexual Activity  . Alcohol use: No    Alcohol/week: 3.0 - 4.0 standard drinks    Types: 3 - 4 Standard drinks or equivalent per week    Comment: 08/17/16- " no desire in past 3- 4 months  . Drug use: No  . Sexual activity: Yes  Lifestyle  . Physical activity    Days per week: Not on file    Minutes per session: Not on file  . Stress: Not on file  Relationships  . Social Herbalist on phone: Not on file    Gets together: Not on file    Attends religious service: Not on file    Active member of club or organization: Not on file    Attends meetings of clubs or organizations: Not on file    Relationship status: Not on file  Other Topics Concern  . Not on file  Social History Narrative  . Not on file    Family History  Problem Relation Age of Onset  . Colon cancer Paternal Uncle   . Heart attack Maternal Uncle         X 2;  both pre 55  . Hypertension Mother   . Breast cancer Mother   . Coronary artery disease Mother   . Brain cancer Paternal Uncle   . Diabetes Maternal Grandfather   . Hypertension Maternal Grandfather        ? metastatic; ? primary  . Diabetes Father   . Stroke Father 53    Review of Systems  Constitutional: Negative for chills and fever.  Respiratory: Negative for cough, shortness of breath and wheezing.   Cardiovascular: Positive for leg swelling (occ). Negative for chest pain and palpitations.  Musculoskeletal: Positive for arthralgias.  Neurological: Positive for dizziness (occ) and headaches (occ). Negative for light-headedness.       Objective:   Vitals:   01/23/19 0828  BP: (!) 146/88  Pulse: 100  Resp: 16  Temp: 98 F (36.7 C)  SpO2: 96%   BP Readings from Last 3 Encounters:  01/23/19 (!) 146/88  07/23/18 (!) 162/94  07/13/18 (!) 111/59   Wt Readings from Last 3 Encounters:  01/23/19 (!) 337 lb (152.9 kg)  07/23/18 (!) 336 lb (152.4 kg)  06/03/18 (!) 348 lb 4.8 oz (158 kg)   Body mass index is 46.35 kg/m.   Physical Exam    Constitutional: Appears well-developed and well-nourished. No distress.  HENT:  Head: Normocephalic and atraumatic.  Neck: Neck supple. No tracheal deviation present. No thyromegaly present.  No cervical lymphadenopathy Cardiovascular: Normal rate, regular rhythm and normal heart sounds.  No murmur heard. No carotid bruit .  Trace b/l LE edema Pulmonary/Chest: Effort normal and breath sounds normal. No respiratory distress. No has no wheezes. No rales.  Skin: Skin is warm and dry. Not diaphoretic.  Psychiatric: Normal mood and affect. Behavior is normal.      Assessment & Plan:    See Problem List for Assessment and Plan of chronic medical problems.

## 2019-01-23 ENCOUNTER — Other Ambulatory Visit: Payer: Self-pay

## 2019-01-23 ENCOUNTER — Other Ambulatory Visit (INDEPENDENT_AMBULATORY_CARE_PROVIDER_SITE_OTHER): Payer: BC Managed Care – PPO

## 2019-01-23 ENCOUNTER — Ambulatory Visit (INDEPENDENT_AMBULATORY_CARE_PROVIDER_SITE_OTHER): Payer: BC Managed Care – PPO | Admitting: Internal Medicine

## 2019-01-23 ENCOUNTER — Encounter: Payer: Self-pay | Admitting: Internal Medicine

## 2019-01-23 VITALS — BP 146/88 | HR 100 | Temp 98.0°F | Resp 16 | Ht 71.5 in | Wt 337.0 lb

## 2019-01-23 DIAGNOSIS — K219 Gastro-esophageal reflux disease without esophagitis: Secondary | ICD-10-CM

## 2019-01-23 DIAGNOSIS — E7849 Other hyperlipidemia: Secondary | ICD-10-CM

## 2019-01-23 DIAGNOSIS — F419 Anxiety disorder, unspecified: Secondary | ICD-10-CM

## 2019-01-23 DIAGNOSIS — Z125 Encounter for screening for malignant neoplasm of prostate: Secondary | ICD-10-CM | POA: Diagnosis not present

## 2019-01-23 DIAGNOSIS — E1141 Type 2 diabetes mellitus with diabetic mononeuropathy: Secondary | ICD-10-CM | POA: Diagnosis not present

## 2019-01-23 DIAGNOSIS — E039 Hypothyroidism, unspecified: Secondary | ICD-10-CM

## 2019-01-23 DIAGNOSIS — I10 Essential (primary) hypertension: Secondary | ICD-10-CM | POA: Diagnosis not present

## 2019-01-23 DIAGNOSIS — Z23 Encounter for immunization: Secondary | ICD-10-CM | POA: Diagnosis not present

## 2019-01-23 LAB — CBC WITH DIFFERENTIAL/PLATELET
Basophils Absolute: 0 10*3/uL (ref 0.0–0.1)
Basophils Relative: 0.3 % (ref 0.0–3.0)
Eosinophils Absolute: 0.1 10*3/uL (ref 0.0–0.7)
Eosinophils Relative: 0.7 % (ref 0.0–5.0)
HCT: 45.2 % (ref 39.0–52.0)
Hemoglobin: 14.7 g/dL (ref 13.0–17.0)
Lymphocytes Relative: 37.5 % (ref 12.0–46.0)
Lymphs Abs: 5 10*3/uL — ABNORMAL HIGH (ref 0.7–4.0)
MCHC: 32.6 g/dL (ref 30.0–36.0)
MCV: 85.9 fl (ref 78.0–100.0)
Monocytes Absolute: 1 10*3/uL (ref 0.1–1.0)
Monocytes Relative: 7.8 % (ref 3.0–12.0)
Neutro Abs: 7.2 10*3/uL (ref 1.4–7.7)
Neutrophils Relative %: 53.7 % (ref 43.0–77.0)
Platelets: 199 10*3/uL (ref 150.0–400.0)
RBC: 5.26 Mil/uL (ref 4.22–5.81)
RDW: 14.9 % (ref 11.5–15.5)
WBC: 13.4 10*3/uL — ABNORMAL HIGH (ref 4.0–10.5)

## 2019-01-23 LAB — COMPREHENSIVE METABOLIC PANEL
ALT: 15 U/L (ref 0–53)
AST: 14 U/L (ref 0–37)
Albumin: 4.3 g/dL (ref 3.5–5.2)
Alkaline Phosphatase: 70 U/L (ref 39–117)
BUN: 24 mg/dL — ABNORMAL HIGH (ref 6–23)
CO2: 27 mEq/L (ref 19–32)
Calcium: 10.1 mg/dL (ref 8.4–10.5)
Chloride: 102 mEq/L (ref 96–112)
Creatinine, Ser: 1.3 mg/dL (ref 0.40–1.50)
GFR: 56.26 mL/min — ABNORMAL LOW (ref >60.00)
Glucose, Bld: 107 mg/dL — ABNORMAL HIGH (ref 70–99)
Potassium: 4.9 mEq/L (ref 3.5–5.1)
Sodium: 138 mEq/L (ref 135–145)
Total Bilirubin: 0.5 mg/dL (ref 0.2–1.2)
Total Protein: 8.3 g/dL (ref 6.0–8.3)

## 2019-01-23 LAB — TSH: TSH: 1.23 u[IU]/mL (ref 0.35–4.50)

## 2019-01-23 LAB — LIPID PANEL
Cholesterol: 140 mg/dL (ref 0–200)
HDL: 38.7 mg/dL — ABNORMAL LOW (ref >39.00)
LDL Cholesterol: 64 mg/dL (ref 0–99)
NonHDL: 101.71
Total CHOL/HDL Ratio: 4
Triglycerides: 188 mg/dL — ABNORMAL HIGH (ref 0.0–149.0)
VLDL: 37.6 mg/dL (ref 0.0–40.0)

## 2019-01-23 LAB — HEMOGLOBIN A1C: Hgb A1c MFr Bld: 6.5 % (ref 4.6–6.5)

## 2019-01-23 MED ORDER — OZEMPIC (1 MG/DOSE) 2 MG/1.5ML ~~LOC~~ SOPN: 1.0000 mg | PEN_INJECTOR | SUBCUTANEOUS | 5 refills | Status: DC

## 2019-01-23 NOTE — Assessment & Plan Note (Signed)
Clinically euthyroid Check tsh  Titrate med dose if needed  

## 2019-01-23 NOTE — Assessment & Plan Note (Signed)
Very controlled Continue metformin Increase ozempic to 1 mg weekly to aid in weight loss Check a1c

## 2019-01-23 NOTE — Assessment & Plan Note (Addendum)
BP a little high here, BP at home 135/80's, occ high, occ low Bring cuff in to next visit Continue weight loss efforts cmp

## 2019-01-23 NOTE — Assessment & Plan Note (Signed)
GERD controlled Continue daily medication  

## 2019-01-23 NOTE — Assessment & Plan Note (Signed)
psa annually

## 2019-01-23 NOTE — Assessment & Plan Note (Signed)
Controlled, stable Continue ativan prn

## 2019-01-23 NOTE — Assessment & Plan Note (Signed)
Check lipid panel  Continue daily statin Regular exercise and healthy diet encouraged  

## 2019-01-26 LAB — PSA, TOTAL AND FREE
PSA, % Free: 40 % (calc) (ref >25)
PSA, Free: 0.2 ng/mL
PSA, Total: 0.5 ng/mL (ref <=4.0)

## 2019-01-27 ENCOUNTER — Encounter: Payer: Self-pay | Admitting: Internal Medicine

## 2019-02-02 ENCOUNTER — Other Ambulatory Visit: Payer: Self-pay | Admitting: Internal Medicine

## 2019-02-09 ENCOUNTER — Other Ambulatory Visit: Payer: Self-pay | Admitting: Internal Medicine

## 2019-03-19 ENCOUNTER — Other Ambulatory Visit: Payer: Self-pay | Admitting: Internal Medicine

## 2019-03-25 ENCOUNTER — Other Ambulatory Visit: Payer: Self-pay | Admitting: Internal Medicine

## 2019-04-14 ENCOUNTER — Other Ambulatory Visit: Payer: Self-pay | Admitting: Internal Medicine

## 2019-04-28 DIAGNOSIS — G4733 Obstructive sleep apnea (adult) (pediatric): Secondary | ICD-10-CM | POA: Diagnosis not present

## 2019-05-04 ENCOUNTER — Other Ambulatory Visit: Payer: Self-pay | Admitting: Internal Medicine

## 2019-05-12 DIAGNOSIS — N3281 Overactive bladder: Secondary | ICD-10-CM | POA: Diagnosis not present

## 2019-05-12 DIAGNOSIS — N5201 Erectile dysfunction due to arterial insufficiency: Secondary | ICD-10-CM | POA: Diagnosis not present

## 2019-05-28 ENCOUNTER — Other Ambulatory Visit: Payer: Self-pay | Admitting: Internal Medicine

## 2019-07-22 ENCOUNTER — Other Ambulatory Visit: Payer: Self-pay | Admitting: Internal Medicine

## 2019-07-23 DIAGNOSIS — D72829 Elevated white blood cell count, unspecified: Secondary | ICD-10-CM | POA: Insufficient documentation

## 2019-07-23 NOTE — Progress Notes (Signed)
Subjective:    Patient ID: Patrick Butler, male    DOB: Jan 25, 1961, 60 y.o.   MRN: [id number]  HPI The patient is here for follow up of their chronic medical problems, including hypertension, diabetes, hyperlipidemia, hypothyroidism, GERD, anxiety, obesity  He is taking all of his medications as prescribed.    He is not exercising regularly due to joint pain.   He thinks is fairly compliant with a diabetic diet, but admits he has had some cake recently.  He does not monitor his blood pressure at home.    Medications and allergies reviewed with patient and updated if appropriate.  Patient Active Problem List   Diagnosis Date Noted  . Leukocytosis 07/23/2019  . Colitis 07/23/2018  . Encounter for prostate cancer screening 01/21/2018  . Anxiety 07/12/2016  . Rash and nonspecific skin eruption 01/13/2016  . Urinary frequency 01/13/2016  . Acute medial meniscus tear of left knee 10/28/2015  . History of colonic polyps   . History of cancer tonsil 07/13/2015  . Erectile dysfunction 07/13/2015  . Type 2 diabetes mellitus ([place]) 01/12/2014  . Severe obesity (BMI >= 40) ([place]) 10/08/2013  . Nonspecific abnormal electrocardiogram (ECG) (EKG) 09/22/2012  . OSA (obstructive sleep apnea) 08/08/2011  . Lumbar disc prolapse with compression radiculopathy 05/24/2011  . GERD 01/18/2009  . HYPERPLASIA PROSTATE UNS W/O UR OBST & OTH LUTS 01/18/2009  . Hypothyroidism 03/23/2008  . Hyperlipidemia 03/23/2008  . Essential hypertension 03/23/2008    Current Outpatient Medications on File Prior to Visit  Medication Sig Dispense Refill  . amLODipine-benazepril (LOTREL) 10-40 MG capsule TAKE 1 CAPSULE BY MOUTH DAILY 90 capsule 1  . aspirin EC 81 MG tablet Take 81 mg by mouth every evening.     Marland Kitchen atenolol (TENORMIN) 100 MG tablet TAKE 1 TABLET BY MOUTH DAILY 90 tablet 1  . cetirizine (ZYRTEC) 10 MG tablet Take 10 mg by mouth daily.    . fluticasone (FLONASE) 50 MCG/ACT nasal spray Place 2 sprays  into the nose as directed. At bedtime (Patient taking differently: Place 2 sprays into the nose at bedtime. ) 16 g 5  . ketoconazole (NIZORAL) 2 % shampoo Apply 1 application topically 2 (two) times a week.    . levothyroxine (SYNTHROID) 200 MCG tablet TAKE 1 TABLET BY MOUTH DAILY BEFORE BREAKFAST 90 tablet 2  . LORazepam (ATIVAN) 0.5 MG tablet TAKE 1 TABLET BY MOUTH DAILY AS NEEDED FOR ANXIETY 30 tablet 0  . metFORMIN (GLUCOPHAGE) 500 MG tablet TAKE 1 TABLET BY MOUTH TWICE DAILY WITH MEALS 180 tablet 1  . Multiple Vitamins-Minerals (MULTIVITAMIN WITH MINERALS) tablet Take 1 tablet by mouth daily.     Marland Kitchen MYRBETRIQ 50 MG TB24 tablet Take 50 mg by mouth daily.    Marland Kitchen OZEMPIC, 1 MG/DOSE, 2 MG/1.5ML SOPN INJECT 1 MG INTO THE SKIN ONCE A WEEK 3 mL 0  . pantoprazole (PROTONIX) 40 MG tablet TAKE 1 TABLET BY MOUTH DAILY 90 tablet 1  . pravastatin (PRAVACHOL) 40 MG tablet TAKE 1 TABLET BY MOUTH DAILY 90 tablet 1  . tolterodine (DETROL LA) 4 MG 24 hr capsule Take 4 mg by mouth every evening.     . TURMERIC PO Take by mouth.     No current facility-administered medications on file prior to visit.    Past Medical History:  Diagnosis Date  . Anxiety   . Arthritis    right knee  . Cancer ([facility]) 2005   tonsils/Chemo and radiation  . Dysphagia 08/2014  .  Dyspnea    with exertion  . Full dentures   . GERD (gastroesophageal reflux disease)   . History of chemotherapy   . History of radiation therapy    neck  . Hyperlipidemia   . Hypertension    states under control with med., has been on med. ~ 5 yr.  . Hypothyroidism   . Non-insulin dependent type 2 diabetes mellitus ([place])   . Posterior neck pain   . Seasonal allergies   . Sleep apnea    uses CPAP nightly    Past Surgical History:  Procedure Laterality Date  . COLONOSCOPY  2012  . COLONOSCOPY WITH PROPOFOL N/A 10/04/2015   Procedure: COLONOSCOPY WITH PROPOFOL;  Surgeon: Doran Stabler, MD;  [facility];  Service:  Gastroenterology;  Laterality: N/A;  . DIRECT LARYNGOSCOPY N/A 08/20/2014   Procedure: DIRECT LARYNGOSCOPY;  Surgeon: Rozetta Nunnery, MD;  Location: [place];  Service: ENT;  Laterality: N/A;  . DIRECT LARYNGOSCOPY N/A 08/21/2016   Procedure: DIRECT LARYNGOSCOPY;  Surgeon: Rozetta Nunnery, MD;  Location: [facility];  Service: ENT;  Laterality: N/A;  . ESOPHAGOSCOPY WITH DILITATION N/A 08/20/2014   Procedure: ESOPHAGOSCOPY WITH DILATATION;  Surgeon: Rozetta Nunnery, MD;  Location: [facility];  Service: ENT;  Laterality: N/A;  . ESOPHAGOSCOPY WITH DILITATION N/A 08/21/2016   Procedure: ESOPHAGOSCOPY WITH DILITATION;  Surgeon: Rozetta Nunnery, MD;  Location: [facility];  Service: ENT;  Laterality: N/A;  . KNEE ARTHROPLASTY Right   . KNEE ARTHROSCOPY Right    x 3  . KNEE ARTHROSCOPY Left 10/28/2015   Procedure: ARTHROSCOPY KNEE;  Surgeon: Dorna Leitz, MD;  Location: [facility];  Service: Orthopedics;  Laterality: Left;  left knee partial medial meniscectomy and patellofemoral chondroplasty  . LAPAROSCOPIC GASTRIC BYPASS  2004  . LUMBAR LAMINECTOMY/DECOMPRESSION MICRODISCECTOMY  05/24/2011   Procedure: LUMBAR LAMINECTOMY/DECOMPRESSION MICRODISCECTOMY;  Surgeon: [place] Cunas, MD;  Location: [facility];  Service: Neurosurgery;  Laterality: Right;  RIGHT L5-S1 Laminectomy, discectomy  . LYMPHADENECTOMY Right    cervical  . MULTIPLE TOOTH EXTRACTIONS    . PORT-A-CATH REMOVAL    . PORTACATH PLACEMENT    . REPLACEMENT TOTAL KNEE Right 03/22/2004  . SHOULDER ARTHROSCOPY Left   . SHOULDER OPEN ROTATOR CUFF REPAIR Right     Social History   Socioeconomic History  . Marital status: Married    Spouse name: [name]  . Number of children: Not on file  . Years of education: Not on file  . Highest education level: Not on file  Occupational History  . Occupation: Information systems manager: [employer]  Tobacco Use  . Smoking status: Former Smoker     Packs/day: 0.00    Years: 25.00    Pack years: 0.00    Quit date: 10/25/2002    Years since quitting: 16.7  . Smokeless tobacco: Never Used  Substance and Sexual Activity  . Alcohol use: No    Alcohol/week: 3.0 - 4.0 standard drinks    Types: 3 - 4 Standard drinks or equivalent per week    Comment: 08/17/16- " no desire in past 3- 4 months  . Drug use: No  . Sexual activity: Yes  Other Topics Concern  . Not on file  Social History Narrative  . Not on file   Social Determinants of Health   Financial Resource Strain:   . Difficulty of Paying Living Expenses:   Food Insecurity:   . Worried About  Running Out of Food in the Last Year:   . [place] in the Last Year:   Transportation Needs:   . Lack of Transportation (Medical):   Marland Kitchen Lack of Transportation (Non-Medical):   Physical Activity:   . Days of Exercise per Week:   . Minutes of Exercise per Session:   Stress:   . Feeling of Stress :   Social Connections:   . Frequency of Communication with Friends and Family:   . Frequency of Social Gatherings with Friends and Family:   . Attends Religious Services:   . Active Member of Clubs or Organizations:   . Attends Archivist Meetings:   Marland Kitchen Marital Status:     Family History  Problem Relation Age of Onset  . Colon cancer Paternal Uncle   . Heart attack Maternal Uncle         X 2; both pre 55  . Hypertension Mother   . Breast cancer Mother   . Coronary artery disease Mother   . Brain cancer Paternal Uncle   . Diabetes Maternal Grandfather   . Hypertension Maternal Grandfather        ? metastatic; ? primary  . Diabetes Father   . Stroke Father 77    Review of Systems  Constitutional: Negative for chills and fever.  Respiratory: Negative for cough, shortness of breath and wheezing.   Cardiovascular: Positive for leg swelling (rare). Negative for chest pain and palpitations.  Neurological: Positive for light-headedness (occ) and headaches (occ).         Objective:   Vitals:   07/24/19 0840  BP: (!) 152/88  Pulse: 98  Temp: 98 F (36.7 C)  SpO2: 94%   BP Readings from Last 3 Encounters:  07/24/19 (!) 152/88  01/23/19 (!) 146/88  07/23/18 (!) 162/94   Wt Readings from Last 3 Encounters:  07/24/19 (!) 339 lb (153.8 kg)  01/23/19 (!) 337 lb (152.9 kg)  07/23/18 (!) 336 lb (152.4 kg)   Body mass index is 46.62 kg/m.   Physical Exam    Constitutional: Appears well-developed and well-nourished. No distress.  HENT:  Head: Normocephalic and atraumatic.  Neck: Neck supple. No tracheal deviation present. No thyromegaly present.  No cervical lymphadenopathy Cardiovascular: Normal rate, regular rhythm and normal heart sounds.   No murmur heard. No carotid bruit .  No edema Pulmonary/Chest: Effort normal and breath sounds normal. No respiratory distress. No has no wheezes. No rales.  Skin: Skin is warm and dry. Not diaphoretic.  Psychiatric: Normal mood and affect. Behavior is normal.      Assessment & Plan:    See Problem List for Assessment and Plan of chronic medical problems.    This visit occurred during the SARS-CoV-2 public health emergency.  Safety protocols were in place, including screening questions prior to the visit, additional usage of staff PPE, and extensive cleaning of exam room while observing appropriate contact time as indicated for disinfecting solutions.

## 2019-07-23 NOTE — Patient Instructions (Addendum)
  Blood work was ordered.     Medications reviewed and updated.  Changes include :   Start hydralazine 25 mg twice daily  Your prescription(s) have been submitted to your pharmacy. Please take as directed and contact our office if you believe you are having problem(s) with the medication(s).     Please followup in 6 months

## 2019-07-24 ENCOUNTER — Ambulatory Visit: Payer: BC Managed Care – PPO | Admitting: Internal Medicine

## 2019-07-24 ENCOUNTER — Encounter: Payer: Self-pay | Admitting: Internal Medicine

## 2019-07-24 ENCOUNTER — Other Ambulatory Visit: Payer: Self-pay

## 2019-07-24 VITALS — BP 152/88 | HR 98 | Temp 98.0°F | Ht 71.5 in | Wt 339.0 lb

## 2019-07-24 DIAGNOSIS — E039 Hypothyroidism, unspecified: Secondary | ICD-10-CM

## 2019-07-24 DIAGNOSIS — I10 Essential (primary) hypertension: Secondary | ICD-10-CM | POA: Diagnosis not present

## 2019-07-24 DIAGNOSIS — D72829 Elevated white blood cell count, unspecified: Secondary | ICD-10-CM

## 2019-07-24 DIAGNOSIS — E7849 Other hyperlipidemia: Secondary | ICD-10-CM

## 2019-07-24 DIAGNOSIS — E1141 Type 2 diabetes mellitus with diabetic mononeuropathy: Secondary | ICD-10-CM

## 2019-07-24 DIAGNOSIS — K219 Gastro-esophageal reflux disease without esophagitis: Secondary | ICD-10-CM

## 2019-07-24 DIAGNOSIS — F419 Anxiety disorder, unspecified: Secondary | ICD-10-CM

## 2019-07-24 LAB — COMPREHENSIVE METABOLIC PANEL
ALT: 14 U/L (ref 0–53)
AST: 15 U/L (ref 0–37)
Albumin: 4 g/dL (ref 3.5–5.2)
Alkaline Phosphatase: 61 U/L (ref 39–117)
BUN: 19 mg/dL (ref 6–23)
CO2: 30 mEq/L (ref 19–32)
Calcium: 9.6 mg/dL (ref 8.4–10.5)
Chloride: 102 mEq/L (ref 96–112)
Creatinine, Ser: 1.27 mg/dL (ref 0.40–1.50)
GFR: 57.7 mL/min — ABNORMAL LOW (ref >60.00)
Glucose, Bld: 109 mg/dL — ABNORMAL HIGH (ref 70–99)
Potassium: 5.4 mEq/L — ABNORMAL HIGH (ref 3.5–5.1)
Sodium: 138 mEq/L (ref 135–145)
Total Bilirubin: 0.5 mg/dL (ref 0.2–1.2)
Total Protein: 7.6 g/dL (ref 6.0–8.3)

## 2019-07-24 LAB — LIPID PANEL
Cholesterol: 127 mg/dL (ref 0–200)
HDL: 36.5 mg/dL — ABNORMAL LOW (ref >39.00)
LDL Cholesterol: 61 mg/dL (ref 0–99)
NonHDL: 90.34
Total CHOL/HDL Ratio: 3
Triglycerides: 145 mg/dL (ref 0.0–149.0)
VLDL: 29 mg/dL (ref 0.0–40.0)

## 2019-07-24 LAB — HEMOGLOBIN A1C: Hgb A1c MFr Bld: 6 % (ref 4.6–6.5)

## 2019-07-24 LAB — CBC WITH DIFFERENTIAL/PLATELET
Basophils Absolute: 0 10*3/uL (ref 0.0–0.1)
Basophils Relative: 0.5 % (ref 0.0–3.0)
Eosinophils Absolute: 0.1 10*3/uL (ref 0.0–0.7)
Eosinophils Relative: 1.5 % (ref 0.0–5.0)
HCT: 44.4 % (ref 39.0–52.0)
Hemoglobin: 14.6 g/dL (ref 13.0–17.0)
Lymphocytes Relative: 49.7 % — ABNORMAL HIGH (ref 12.0–46.0)
Lymphs Abs: 4.3 10*3/uL — ABNORMAL HIGH (ref 0.7–4.0)
MCHC: 32.8 g/dL (ref 30.0–36.0)
MCV: 87.3 fl (ref 78.0–100.0)
Monocytes Absolute: 0.7 10*3/uL (ref 0.1–1.0)
Monocytes Relative: 8.6 % (ref 3.0–12.0)
Neutro Abs: 3.4 10*3/uL (ref 1.4–7.7)
Neutrophils Relative %: 39.7 % — ABNORMAL LOW (ref 43.0–77.0)
Platelets: 184 10*3/uL (ref 150.0–400.0)
RBC: 5.09 Mil/uL (ref 4.22–5.81)
RDW: 15 % (ref 11.5–15.5)
WBC: 8.6 10*3/uL (ref 4.0–10.5)

## 2019-07-24 LAB — TSH: TSH: 2.25 u[IU]/mL (ref 0.35–4.50)

## 2019-07-24 MED ORDER — HYDRALAZINE HCL 25 MG PO TABS: 25.0000 mg | ORAL_TABLET | Freq: Two times a day (BID) | ORAL | 5 refills | Status: DC

## 2019-07-24 NOTE — Assessment & Plan Note (Signed)
Chronic, mild CBC 

## 2019-07-24 NOTE — Assessment & Plan Note (Addendum)
Chronic BP not controlled Start hydralazine 25 mg BID Continue current medications at current doses cmp

## 2019-07-24 NOTE — Assessment & Plan Note (Addendum)
Chronic, controlled Taking Metformin, Ozempic Check A1c Stressed the importance of regular exercise, diabetic diet Encouraged weight loss Will adjust medication if necessary

## 2019-07-24 NOTE — Assessment & Plan Note (Addendum)
Chronic Stressed the importance of weight loss for his blood pressure, to decrease the need for more medications and possibly get rid of medications, for joint pain and energy level Encouraged increased activity Continue Ozempic at current dose and Metformin, which will hopefully help Discussed healthy diet low in sugars and carbohydrates and decrease portions

## 2019-07-24 NOTE — Assessment & Plan Note (Signed)
Chronic  Clinically euthyroid Check tsh  Titrate med dose if needed  

## 2019-07-24 NOTE — Assessment & Plan Note (Signed)
Chronic Check lipid panel  Continue daily statin-pravastatin Regular exercise and healthy diet encouraged

## 2019-07-24 NOTE — Assessment & Plan Note (Signed)
Chronic GERD controlled Continue daily medication-pantoprazole 40 mg daily

## 2019-07-24 NOTE — Assessment & Plan Note (Signed)
Chronic Controlled, stable Continue Ativan as needed-he does not take on a daily basis

## 2019-07-28 ENCOUNTER — Encounter: Payer: Self-pay | Admitting: Internal Medicine

## 2019-08-10 ENCOUNTER — Other Ambulatory Visit: Payer: Self-pay | Admitting: Internal Medicine

## 2019-08-15 ENCOUNTER — Other Ambulatory Visit: Payer: Self-pay | Admitting: Internal Medicine

## 2019-08-19 ENCOUNTER — Other Ambulatory Visit: Payer: Self-pay | Admitting: Internal Medicine

## 2019-09-04 ENCOUNTER — Other Ambulatory Visit: Payer: Self-pay | Admitting: Internal Medicine

## 2019-09-04 NOTE — Telephone Encounter (Signed)
Last OV 07/24/19 Next OV 01/25/20 Last RF 05/28/19

## 2019-09-16 ENCOUNTER — Other Ambulatory Visit: Payer: Self-pay | Admitting: Internal Medicine

## 2019-09-22 ENCOUNTER — Encounter: Payer: Self-pay | Admitting: Internal Medicine

## 2019-09-25 ENCOUNTER — Other Ambulatory Visit: Payer: Self-pay | Admitting: Internal Medicine

## 2019-10-13 ENCOUNTER — Other Ambulatory Visit: Payer: Self-pay | Admitting: Internal Medicine

## 2019-10-14 ENCOUNTER — Other Ambulatory Visit: Payer: Self-pay | Admitting: Internal Medicine

## 2019-10-15 ENCOUNTER — Encounter: Payer: Self-pay | Admitting: Internal Medicine

## 2019-10-19 NOTE — Progress Notes (Signed)
Subjective:    Patient ID: Patrick Butler, male    DOB: Oct 14, 1960, 59 y.o.   MRN: [id number]  HPI The patient is here for an acute visit.  His BP and HR have been fluctuating.    HR has been 95-110 - 50% of the time over 100.    His BP has ranged 91/62 - 134/87.   87/? Lowest, 157/? highest   He has had some lightheadedness with low BP readings.  He denies edema chest pain and palpitations.   Medications and allergies reviewed with patient and updated if appropriate.  Patient Active Problem List   Diagnosis Date Noted  . Leukocytosis 07/23/2019  . Colitis 07/23/2018  . Encounter for prostate cancer screening 01/21/2018  . Anxiety 07/12/2016  . Rash and nonspecific skin eruption 01/13/2016  . Urinary frequency 01/13/2016  . Acute medial meniscus tear of left knee 10/28/2015  . History of colonic polyps   . History of cancer tonsil 07/13/2015  . Erectile dysfunction 07/13/2015  . Type 2 diabetes mellitus ([place]) 01/12/2014  . Severe obesity (BMI >= 40) ([place]) 10/08/2013  . Nonspecific abnormal electrocardiogram (ECG) (EKG) 09/22/2012  . OSA (obstructive sleep apnea) 08/08/2011  . Lumbar disc prolapse with compression radiculopathy 05/24/2011  . GERD 01/18/2009  . HYPERPLASIA PROSTATE UNS W/O UR OBST & OTH LUTS 01/18/2009  . Hypothyroidism 03/23/2008  . Hyperlipidemia 03/23/2008  . Essential hypertension 03/23/2008    Current Outpatient Medications on File Prior to Visit  Medication Sig Dispense Refill  . amLODipine-benazepril (LOTREL) 10-40 MG capsule TAKE 1 CAPSULE BY MOUTH DAILY 90 capsule 1  . aspirin EC 81 MG tablet Take 81 mg by mouth every evening.     Marland Kitchen atenolol (TENORMIN) 100 MG tablet TAKE 1 TABLET BY MOUTH DAILY 90 tablet 1  . cetirizine (ZYRTEC) 10 MG tablet Take 10 mg by mouth daily.    . fluticasone (FLONASE) 50 MCG/ACT nasal spray Place 2 sprays into the nose as directed. At bedtime (Patient taking differently: Place 2 sprays into the nose at bedtime. ) 16  g 5  . ketoconazole (NIZORAL) 2 % shampoo Apply 1 application topically 2 (two) times a week.    . levothyroxine (SYNTHROID) 200 MCG tablet TAKE 1 TABLET BY MOUTH DAILY BEFORE BREAKFAST 90 tablet 2  . LORazepam (ATIVAN) 0.5 MG tablet TAKE 1 TABLET BY MOUTH DAILY AS NEEDED FOR ANXIETY 30 tablet 0  . metFORMIN (GLUCOPHAGE) 500 MG tablet TAKE 1 TABLET BY MOUTH TWICE DAILY WITH MEALS 180 tablet 1  . Multiple Vitamins-Minerals (MULTIVITAMIN WITH MINERALS) tablet Take 1 tablet by mouth daily.     Marland Kitchen OZEMPIC, 1 MG/DOSE, 2 MG/1.5ML SOPN INJECT 1 MG INTO THE SKIN ONCE A WEEK 3 mL 0  . pantoprazole (PROTONIX) 40 MG tablet TAKE 1 TABLET BY MOUTH DAILY 90 tablet 0  . pravastatin (PRAVACHOL) 40 MG tablet TAKE 1 TABLET BY MOUTH DAILY 90 tablet 1  . tolterodine (DETROL LA) 4 MG 24 hr capsule Take 4 mg by mouth every evening.     . TURMERIC PO Take by mouth.     No current facility-administered medications on file prior to visit.    Past Medical History:  Diagnosis Date  . Anxiety   . Arthritis    right knee  . Cancer ([place]) 2005   tonsils/Chemo and radiation  . Dysphagia 08/2014  . Dyspnea    with exertion  . Full dentures   . GERD (gastroesophageal reflux disease)   . History  of chemotherapy   . History of radiation therapy    neck  . Hyperlipidemia   . Hypertension    states under control with med., has been on med. ~ 5 yr.  . Hypothyroidism   . Non-insulin dependent type 2 diabetes mellitus ([place])   . Posterior neck pain   . Seasonal allergies   . Sleep apnea    uses CPAP nightly    Past Surgical History:  Procedure Laterality Date  . COLONOSCOPY  2012  . COLONOSCOPY WITH PROPOFOL N/A 10/04/2015   Procedure: COLONOSCOPY WITH PROPOFOL;  Surgeon: Doran Stabler, MD;  [facility]: [facility];  Service: Gastroenterology;  Laterality: N/A;  . DIRECT LARYNGOSCOPY N/A 08/20/2014   Procedure: DIRECT LARYNGOSCOPY;  Surgeon: Rozetta Nunnery, MD;  Location: [facility];   Service: ENT;  Laterality: N/A;  . DIRECT LARYNGOSCOPY N/A 08/21/2016   Procedure: DIRECT LARYNGOSCOPY;  Surgeon: Rozetta Nunnery, MD;  Location: [place];  Service: ENT;  Laterality: N/A;  . ESOPHAGOSCOPY WITH DILITATION N/A 08/20/2014   Procedure: ESOPHAGOSCOPY WITH DILATATION;  Surgeon: Rozetta Nunnery, MD;  Location: [facility];  Service: ENT;  Laterality: N/A;  . ESOPHAGOSCOPY WITH DILITATION N/A 08/21/2016   Procedure: ESOPHAGOSCOPY WITH DILITATION;  Surgeon: Rozetta Nunnery, MD;  Location: [place];  Service: ENT;  Laterality: N/A;  . KNEE ARTHROPLASTY Right   . KNEE ARTHROSCOPY Right    x 3  . KNEE ARTHROSCOPY Left 10/28/2015   Procedure: ARTHROSCOPY KNEE;  Surgeon: Dorna Leitz, MD;  Location: [facility];  Service: Orthopedics;  Laterality: Left;  left knee partial medial meniscectomy and patellofemoral chondroplasty  . LAPAROSCOPIC GASTRIC BYPASS  2004  . LUMBAR LAMINECTOMY/DECOMPRESSION MICRODISCECTOMY  05/24/2011   Procedure: LUMBAR LAMINECTOMY/DECOMPRESSION MICRODISCECTOMY;  Surgeon: Winfield Cunas, MD;  Location: [facility];  Service: Neurosurgery;  Laterality: Right;  RIGHT L5-S1 Laminectomy, discectomy  . LYMPHADENECTOMY Right    cervical  . MULTIPLE TOOTH EXTRACTIONS    . PORT-A-CATH REMOVAL    . PORTACATH PLACEMENT    . REPLACEMENT TOTAL KNEE Right 03/22/2004  . SHOULDER ARTHROSCOPY Left   . SHOULDER OPEN ROTATOR CUFF REPAIR Right     Social History   Socioeconomic History  . Marital status: Married    Spouse name: [name]  . Number of children: Not on file  . Years of education: Not on file  . Highest education level: Not on file  Occupational History  . Occupation: Information systems manager: [employer]  Tobacco Use  . Smoking status: Former Smoker    Packs/day: 0.00    Years: 25.00    Pack years: 0.00    Quit date: 10/25/2002    Years since quitting: 16.9  . Smokeless tobacco: Never Used  Vaping Use  . Vaping Use: Never used    Substance and Sexual Activity  . Alcohol use: No    Alcohol/week: 3.0 - 4.0 standard drinks    Types: 3 - 4 Standard drinks or equivalent per week    Comment: 08/17/16- " no desire in past 3- 4 months  . Drug use: No  . Sexual activity: Yes  Other Topics Concern  . Not on file  Social History Narrative  . Not on file   Social Determinants of Health   Financial Resource Strain:   . Difficulty of Paying Living Expenses:   Food Insecurity:   . Worried About Charity fundraiser in the Last Year:   .  Ran Out of Food in the Last Year:   Transportation Needs:   . Film/video editor (Medical):   Marland Kitchen Lack of Transportation (Non-Medical):   Physical Activity:   . Days of Exercise per Week:   . Minutes of Exercise per Session:   Stress:   . Feeling of Stress :   Social Connections:   . Frequency of Communication with Friends and Family:   . Frequency of Social Gatherings with Friends and Family:   . Attends Religious Services:   . Active Member of Clubs or Organizations:   . Attends Archivist Meetings:   Marland Kitchen Marital Status:     Family History  Problem Relation Age of Onset  . Colon cancer Paternal Uncle   . Heart attack Maternal Uncle         X 2; both pre 55  . Hypertension Mother   . Breast cancer Mother   . Coronary artery disease Mother   . Brain cancer Paternal Uncle   . Diabetes Maternal Grandfather   . Hypertension Maternal Grandfather        ? metastatic; ? primary  . Diabetes Father   . Stroke Father 34  . Dementia Father     Review of Systems  Cardiovascular: Negative for chest pain and palpitations.  Neurological: Positive for light-headedness (when BP low). Negative for headaches.       Objective:   Vitals:   10/20/19 0811  BP: (!) 150/90  Pulse: 95  Temp: 98.3 F (36.8 C)  SpO2: 98%   BP Readings from Last 3 Encounters:  10/20/19 (!) 150/90  07/24/19 (!) 152/88  01/23/19 (!) 146/88   Wt Readings from Last 3 Encounters:  10/20/19  (!) 332 lb (150.6 kg)  07/24/19 (!) 339 lb (153.8 kg)  01/23/19 (!) 337 lb (152.9 kg)   Body mass index is 45.66 kg/m.   Physical Exam    Constitutional: Appears well-developed and well-nourished. No distress.  Head: Normocephalic and atraumatic.  Neck: Neck supple. No tracheal deviation present. No thyromegaly present.  No cervical lymphadenopathy Cardiovascular: Normal rate, regular rhythm and normal heart sounds.  No murmur heard. No carotid bruit .  No edema Pulmonary/Chest: Effort normal and breath sounds normal. No respiratory distress. No has no wheezes. No rales.  Skin: Skin is warm and dry. Not diaphoretic.  Psychiatric: Normal mood and affect. Behavior is normal.       Assessment & Plan:    See Problem List for Assessment and Plan of chronic medical problems.    This visit occurred during the SARS-CoV-2 public health emergency.  Safety protocols were in place, including screening questions prior to the visit, additional usage of staff PPE, and extensive cleaning of exam room while observing appropriate contact time as indicated for disinfecting solutions.

## 2019-10-20 ENCOUNTER — Other Ambulatory Visit: Payer: Self-pay

## 2019-10-20 ENCOUNTER — Ambulatory Visit: Payer: BC Managed Care – PPO | Admitting: Internal Medicine

## 2019-10-20 ENCOUNTER — Encounter: Payer: Self-pay | Admitting: Internal Medicine

## 2019-10-20 DIAGNOSIS — I10 Essential (primary) hypertension: Secondary | ICD-10-CM

## 2019-10-20 MED ORDER — METOPROLOL SUCCINATE ER 100 MG PO TB24: 100.0000 mg | ORAL_TABLET | Freq: Every day | ORAL | 1 refills | Status: DC

## 2019-10-20 NOTE — Assessment & Plan Note (Addendum)
Chronic Not ideally controlled BP variable and HR on higher side.  Lightheaded w/ low BP Will try changing atenolol to metoprolol Monitor BP at home - update me with readings Continue weight loss efforts

## 2019-10-20 NOTE — Patient Instructions (Addendum)
   Medications reviewed and updated.  Changes include :   Hold the atenolol.  Start metoprolol 100 mg daily.    Monitor your BP at home and update me with your numbers.  Your prescription(s) have been submitted to your pharmacy. Please take as directed and contact our office if you believe you are having problem(s) with the medication(s).

## 2019-11-05 DIAGNOSIS — G4733 Obstructive sleep apnea (adult) (pediatric): Secondary | ICD-10-CM | POA: Diagnosis not present

## 2019-11-06 ENCOUNTER — Encounter: Payer: Self-pay | Admitting: Internal Medicine

## 2019-11-16 ENCOUNTER — Other Ambulatory Visit: Payer: Self-pay | Admitting: Internal Medicine

## 2019-12-03 DIAGNOSIS — H25013 Cortical age-related cataract, bilateral: Secondary | ICD-10-CM | POA: Diagnosis not present

## 2019-12-03 DIAGNOSIS — H524 Presbyopia: Secondary | ICD-10-CM | POA: Diagnosis not present

## 2019-12-03 DIAGNOSIS — H2513 Age-related nuclear cataract, bilateral: Secondary | ICD-10-CM | POA: Diagnosis not present

## 2019-12-03 DIAGNOSIS — E113291 Type 2 diabetes mellitus with mild nonproliferative diabetic retinopathy without macular edema, right eye: Secondary | ICD-10-CM | POA: Diagnosis not present

## 2019-12-03 LAB — HM DIABETES EYE EXAM

## 2019-12-17 ENCOUNTER — Encounter: Payer: Self-pay | Admitting: Internal Medicine

## 2019-12-17 NOTE — Progress Notes (Signed)
Outside notes received. Information abstracted. Notes sent to scan.  

## 2019-12-23 ENCOUNTER — Other Ambulatory Visit: Payer: Self-pay | Admitting: Internal Medicine

## 2020-01-23 NOTE — Progress Notes (Signed)
Subjective:    Patient ID: Patrick Butler, male    DOB: 04/12/1961, 59 y.o.   MRN: [id number]  HPI The patient is here for follow up of their chronic medical problems, including DM, htn, hyperlipidemia, hypothyroidism, GERD, anxiety, obesity  He is taking all of his medications as prescribed.    He is not exercising regularly.     BP at home 130-140/65-90  Medications and allergies reviewed with patient and updated if appropriate.  Patient Active Problem List   Diagnosis Date Noted  . Leukocytosis 07/23/2019  . Colitis 07/23/2018  . Encounter for prostate cancer screening 01/21/2018  . Anxiety 07/12/2016  . Rash and nonspecific skin eruption 01/13/2016  . Urinary frequency 01/13/2016  . Acute medial meniscus tear of left knee 10/28/2015  . History of colonic polyps   . History of cancer tonsil 07/13/2015  . Erectile dysfunction 07/13/2015  . Type 2 diabetes mellitus ([place]) 01/12/2014  . Severe obesity (BMI >= 40) ([place]) 10/08/2013  . Nonspecific abnormal electrocardiogram (ECG) (EKG) 09/22/2012  . OSA (obstructive sleep apnea) 08/08/2011  . Lumbar disc prolapse with compression radiculopathy 05/24/2011  . GERD 01/18/2009  . HYPERPLASIA PROSTATE UNS W/O UR OBST & OTH LUTS 01/18/2009  . Hypothyroidism 03/23/2008  . Hyperlipidemia 03/23/2008  . Essential hypertension 03/23/2008    Current Outpatient Medications on File Prior to Visit  Medication Sig Dispense Refill  . amLODipine-benazepril (LOTREL) 10-40 MG capsule TAKE 1 CAPSULE BY MOUTH DAILY 90 capsule 1  . aspirin EC 81 MG tablet Take 81 mg by mouth every evening.     . cetirizine (ZYRTEC) 10 MG tablet Take 10 mg by mouth daily.    . fluticasone (FLONASE) 50 MCG/ACT nasal spray Place 2 sprays into the nose as directed. At bedtime (Patient taking differently: Place 2 sprays into the nose at bedtime. ) 16 g 5  . ketoconazole (NIZORAL) 2 % shampoo Apply 1 application topically 2 (two) times a week.    . levothyroxine  (SYNTHROID) 200 MCG tablet TAKE 1 TABLET BY MOUTH DAILY BEFORE BREAKFAST 90 tablet 2  . LORazepam (ATIVAN) 0.5 MG tablet TAKE 1 TABLET BY MOUTH DAILY AS NEEDED FOR ANXIETY 30 tablet 0  . metFORMIN (GLUCOPHAGE) 500 MG tablet TAKE 1 TABLET BY MOUTH TWICE DAILY WITH MEALS 180 tablet 1  . metoprolol succinate (TOPROL XL) 100 MG 24 hr tablet Take 1 tablet (100 mg total) by mouth daily. Take with or immediately following a meal. 90 tablet 1  . Multiple Vitamins-Minerals (MULTIVITAMIN WITH MINERALS) tablet Take 1 tablet by mouth daily.     Marland Kitchen OZEMPIC, 1 MG/DOSE, 2 MG/1.5ML SOPN INJECT 1 MG INTO THE SKIN ONCE A WEEK 3 mL 2  . pantoprazole (PROTONIX) 40 MG tablet TAKE 1 TABLET BY MOUTH DAILY 90 tablet 0  . pravastatin (PRAVACHOL) 40 MG tablet TAKE 1 TABLET BY MOUTH DAILY 90 tablet 1  . tolterodine (DETROL LA) 4 MG 24 hr capsule Take 4 mg by mouth every evening.     . TURMERIC PO Take by mouth.     No current facility-administered medications on file prior to visit.    Past Medical History:  Diagnosis Date  . Anxiety   . Arthritis    right knee  . Cancer ([place]) 2005   tonsils/Chemo and radiation  . Dysphagia 08/2014  . Dyspnea    with exertion  . Full dentures   . GERD (gastroesophageal reflux disease)   . History of chemotherapy   . History  of radiation therapy    neck  . Hyperlipidemia   . Hypertension    states under control with med., has been on med. ~ 5 yr.  . Hypothyroidism   . Non-insulin dependent type 2 diabetes mellitus ([place])   . Posterior neck pain   . Seasonal allergies   . Sleep apnea    uses CPAP nightly    Past Surgical History:  Procedure Laterality Date  . COLONOSCOPY  2012  . COLONOSCOPY WITH PROPOFOL N/A 10/04/2015   Procedure: COLONOSCOPY WITH PROPOFOL;  Surgeon: Doran Stabler, MD;  [facility];  Service: Gastroenterology;  Laterality: N/A;  . DIRECT LARYNGOSCOPY N/A 08/20/2014   Procedure: DIRECT LARYNGOSCOPY;  Surgeon: Rozetta Nunnery, MD;   Location: [facility];  Service: ENT;  Laterality: N/A;  . DIRECT LARYNGOSCOPY N/A 08/21/2016   Procedure: DIRECT LARYNGOSCOPY;  Surgeon: Rozetta Nunnery, MD;  Location: [facility];  Service: ENT;  Laterality: N/A;  . ESOPHAGOSCOPY WITH DILITATION N/A 08/20/2014   Procedure: ESOPHAGOSCOPY WITH DILATATION;  Surgeon: Rozetta Nunnery, MD;  Location: [facility];  Service: ENT;  Laterality: N/A;  . ESOPHAGOSCOPY WITH DILITATION N/A 08/21/2016   Procedure: ESOPHAGOSCOPY WITH DILITATION;  Surgeon: Rozetta Nunnery, MD;  Location: [facility];  Service: ENT;  Laterality: N/A;  . KNEE ARTHROPLASTY Right   . KNEE ARTHROSCOPY Right    x 3  . KNEE ARTHROSCOPY Left 10/28/2015   Procedure: ARTHROSCOPY KNEE;  Surgeon: Dorna Leitz, MD;  Location: [facility];  Service: Orthopedics;  Laterality: Left;  left knee partial medial meniscectomy and patellofemoral chondroplasty  . LAPAROSCOPIC GASTRIC BYPASS  2004  . LUMBAR LAMINECTOMY/DECOMPRESSION MICRODISCECTOMY  05/24/2011   Procedure: LUMBAR LAMINECTOMY/DECOMPRESSION MICRODISCECTOMY;  Surgeon: Winfield Cunas, MD;  Location: [facility];  Service: Neurosurgery;  Laterality: Right;  RIGHT L5-S1 Laminectomy, discectomy  . LYMPHADENECTOMY Right    cervical  . MULTIPLE TOOTH EXTRACTIONS    . PORT-A-CATH REMOVAL    . PORTACATH PLACEMENT    . REPLACEMENT TOTAL KNEE Right 03/22/2004  . SHOULDER ARTHROSCOPY Left   . SHOULDER OPEN ROTATOR CUFF REPAIR Right     Social History   Socioeconomic History  . Marital status: Married    Spouse name: [name]  . Number of children: Not on file  . Years of education: Not on file  . Highest education level: Not on file  Occupational History  . Occupation: Information systems manager: [employer]  Tobacco Use  . Smoking status: Former Smoker    Packs/day: 0.00    Years: 25.00    Pack years: 0.00    Quit date: 10/25/2002    Years since quitting: 17.2  . Smokeless tobacco: Never Used    Vaping Use  . Vaping Use: Never used  Substance and Sexual Activity  . Alcohol use: No    Alcohol/week: 3.0 - 4.0 standard drinks    Types: 3 - 4 Standard drinks or equivalent per week    Comment: 08/17/16- " no desire in past 3- 4 months  . Drug use: No  . Sexual activity: Yes  Other Topics Concern  . Not on file  Social History Narrative  . Not on file   Social Determinants of Health   Financial Resource Strain:   . Difficulty of Paying Living Expenses: Not on file  Food Insecurity:   . Worried About Charity fundraiser in the Last Year: Not on file  . Ran Out  of Food in the Last Year: Not on file  Transportation Needs:   . Lack of Transportation (Medical): Not on file  . Lack of Transportation (Non-Medical): Not on file  Physical Activity:   . Days of Exercise per Week: Not on file  . Minutes of Exercise per Session: Not on file  Stress:   . Feeling of Stress : Not on file  Social Connections:   . Frequency of Communication with Friends and Family: Not on file  . Frequency of Social Gatherings with Friends and Family: Not on file  . Attends Religious Services: Not on file  . Active Member of Clubs or Organizations: Not on file  . Attends Archivist Meetings: Not on file  . Marital Status: Not on file    Family History  Problem Relation Age of Onset  . Colon cancer Paternal Uncle   . Heart attack Maternal Uncle         X 2; both pre 55  . Hypertension Mother   . Breast cancer Mother   . Coronary artery disease Mother   . Brain cancer Paternal Uncle   . Diabetes Maternal Grandfather   . Hypertension Maternal Grandfather        ? metastatic; ? primary  . Diabetes Father   . Stroke Father 87  . Dementia Father     Review of Systems  Constitutional: Negative for chills and fever.  Respiratory: Negative for cough, shortness of breath and wheezing.   Cardiovascular: Positive for leg swelling (occ). Negative for chest pain and palpitations.   Neurological: Positive for headaches (occ). Negative for dizziness and light-headedness.       Objective:   Vitals:   01/25/20 0910  BP: 138/90  Pulse: (!) 101  Temp: 98.4 F (36.9 C)  SpO2: 94%   BP Readings from Last 3 Encounters:  01/25/20 138/90  10/20/19 (!) 150/90  07/24/19 (!) 152/88   Wt Readings from Last 3 Encounters:  01/25/20 (!) 330 lb 9.6 oz (150 kg)  10/20/19 (!) 332 lb (150.6 kg)  07/24/19 (!) 339 lb (153.8 kg)   Body mass index is 45.47 kg/m.   Physical Exam    Constitutional: Appears well-developed and well-nourished. No distress.  HENT:  Head: Normocephalic and atraumatic.  Neck: Neck supple. No tracheal deviation present. No thyromegaly present.  No cervical lymphadenopathy Cardiovascular: Normal rate, regular rhythm and normal heart sounds.   No murmur heard. No carotid bruit .  No edema Pulmonary/Chest: Effort normal and breath sounds normal. No respiratory distress. No has no wheezes. No rales.  Skin: Skin is warm and dry. Not diaphoretic.  Psychiatric: Normal mood and affect. Behavior is normal.      Assessment & Plan:    See Problem List for Assessment and Plan of chronic medical problems.    This visit occurred during the SARS-CoV-2 public health emergency.  Safety protocols were in place, including screening questions prior to the visit, additional usage of staff PPE, and extensive cleaning of exam room while observing appropriate contact time as indicated for disinfecting solutions.

## 2020-01-23 NOTE — Patient Instructions (Addendum)
  Blood work was ordered.     Medications reviewed and updated.  Changes include :   Add 50 mg of metoprolol daily  Your prescription(s) have been submitted to your pharmacy. Please take as directed and contact our office if you believe you are having problem(s) with the medication(s).    Please followup in 6 months

## 2020-01-25 ENCOUNTER — Encounter: Payer: Self-pay | Admitting: Internal Medicine

## 2020-01-25 ENCOUNTER — Ambulatory Visit: Payer: BC Managed Care – PPO | Admitting: Internal Medicine

## 2020-01-25 ENCOUNTER — Other Ambulatory Visit: Payer: Self-pay

## 2020-01-25 VITALS — BP 138/90 | HR 101 | Temp 98.4°F | Wt 330.6 lb

## 2020-01-25 DIAGNOSIS — Z125 Encounter for screening for malignant neoplasm of prostate: Secondary | ICD-10-CM

## 2020-01-25 DIAGNOSIS — F419 Anxiety disorder, unspecified: Secondary | ICD-10-CM

## 2020-01-25 DIAGNOSIS — E1141 Type 2 diabetes mellitus with diabetic mononeuropathy: Secondary | ICD-10-CM

## 2020-01-25 DIAGNOSIS — E7849 Other hyperlipidemia: Secondary | ICD-10-CM

## 2020-01-25 DIAGNOSIS — Z23 Encounter for immunization: Secondary | ICD-10-CM

## 2020-01-25 DIAGNOSIS — E039 Hypothyroidism, unspecified: Secondary | ICD-10-CM | POA: Diagnosis not present

## 2020-01-25 DIAGNOSIS — I10 Essential (primary) hypertension: Secondary | ICD-10-CM

## 2020-01-25 DIAGNOSIS — K219 Gastro-esophageal reflux disease without esophagitis: Secondary | ICD-10-CM

## 2020-01-25 MED ORDER — METOPROLOL SUCCINATE ER 50 MG PO TB24
50.0000 mg | ORAL_TABLET | Freq: Every day | ORAL | 3 refills | Status: DC
Start: 2020-01-25 — End: 2021-01-16

## 2020-01-25 NOTE — Assessment & Plan Note (Signed)
Chronic  Clinically euthyroid Check tsh  Titrate med dose if needed  

## 2020-01-25 NOTE — Assessment & Plan Note (Addendum)
Chronic controlled Continue daily statin Regular exercise and healthy diet encouraged

## 2020-01-25 NOTE — Assessment & Plan Note (Signed)
Chronic Not ideally controlled Add metoprolol XL 50 mg daily Continue other meds cmp

## 2020-01-25 NOTE — Assessment & Plan Note (Signed)
Chronic Controlled, stable Continue current dose of medication - ativan prn

## 2020-01-25 NOTE — Assessment & Plan Note (Signed)
Chronic Stressed continue weight loss - has lost a couple of pounds On ozempic - continue

## 2020-01-25 NOTE — Assessment & Plan Note (Signed)
Chronic GERD controlled Continue daily medication  

## 2020-01-25 NOTE — Assessment & Plan Note (Addendum)
Chronic Controlled Check a1c Low sugar / carb diet Stressed regular exercise Encouraged further weight loss Continue current meds

## 2020-01-25 NOTE — Assessment & Plan Note (Signed)
psa

## 2020-01-26 LAB — PSA, TOTAL AND FREE
PSA, % Free: 33 % (calc) (ref >25)
PSA, Free: 0.1 ng/mL
PSA, Total: 0.3 ng/mL (ref <=4.0)

## 2020-01-26 LAB — COMPLETE METABOLIC PANEL WITH GFR
AG Ratio: 1.3 (calc) (ref 1.0–2.5)
ALT: 15 U/L (ref 9–46)
AST: 15 U/L (ref 10–35)
Albumin: 4 g/dL (ref 3.6–5.1)
Alkaline phosphatase (APISO): 58 U/L (ref 35–144)
BUN: 16 mg/dL (ref 7–25)
CO2: 25 mmol/L (ref 20–32)
Calcium: 9.6 mg/dL (ref 8.6–10.3)
Chloride: 104 mmol/L (ref 98–110)
Creat: 1.06 mg/dL (ref 0.70–1.25)
GFR, Est African American: 87 mL/min/{1.73_m2} (ref >=60)
GFR, Est Non African American: 75 mL/min/{1.73_m2} (ref >=60)
Globulin: 3.1 g/dL (calc) (ref 1.9–3.7)
Glucose, Bld: 99 mg/dL (ref 65–99)
Potassium: 4.8 mmol/L (ref 3.5–5.3)
Sodium: 140 mmol/L (ref 135–146)
Total Bilirubin: 0.6 mg/dL (ref 0.2–1.2)
Total Protein: 7.1 g/dL (ref 6.1–8.1)

## 2020-01-26 LAB — HEMOGLOBIN A1C
Hgb A1c MFr Bld: 5.8 % of total Hgb — ABNORMAL HIGH (ref <5.7)
Mean Plasma Glucose: 120 (calc)
eAG (mmol/L): 6.6 (calc)

## 2020-01-26 LAB — TSH: TSH: 2.2 mIU/L (ref 0.40–4.50)

## 2020-01-29 ENCOUNTER — Other Ambulatory Visit: Payer: Self-pay | Admitting: Internal Medicine

## 2020-02-12 ENCOUNTER — Other Ambulatory Visit: Payer: Self-pay | Admitting: Internal Medicine

## 2020-02-17 ENCOUNTER — Other Ambulatory Visit: Payer: Self-pay

## 2020-02-17 ENCOUNTER — Ambulatory Visit (INDEPENDENT_AMBULATORY_CARE_PROVIDER_SITE_OTHER): Payer: BC Managed Care – PPO | Admitting: Otolaryngology

## 2020-02-17 VITALS — Temp 96.6°F

## 2020-02-17 DIAGNOSIS — K222 Esophageal obstruction: Secondary | ICD-10-CM

## 2020-02-17 DIAGNOSIS — Z85819 Personal history of malignant neoplasm of unspecified site of lip, oral cavity, and pharynx: Secondary | ICD-10-CM

## 2020-02-17 NOTE — Progress Notes (Signed)
HPI: Patrick Butler is a 59 y.o. male who returns today for evaluation of esophageal stricture.  Patient has a previous history of chemoradiation treatment for a tonsil cancer performed in 2005.  He has done well concerning his cancer but subsequently developed a cervical esophageal stricture that required initial dilation in 2016 and a subsequent dilation in April 2018.  He has been having progressive difficulty swallowing pills as well as boluses with food over the past several months and presents today to have repeat dilation. He has history of hypertension and non-insulin-dependent diabetes.  He also takes a baby aspirin.  He has had recent blood work that was within normal limits.  No history of cardiac disease.  Patient is significantly obese but has lost some weight over the past year or 2...  Past Medical History:  Diagnosis Date  . Anxiety   . Arthritis    right knee  . Cancer ([place]) 2005   tonsils/Chemo and radiation  . Dysphagia 08/2014  . Dyspnea    with exertion  . Full dentures   . GERD (gastroesophageal reflux disease)   . History of chemotherapy   . History of radiation therapy    neck  . Hyperlipidemia   . Hypertension    states under control with med., has been on med. ~ 5 yr.  . Hypothyroidism   . Non-insulin dependent type 2 diabetes mellitus ([place])   . Posterior neck pain   . Seasonal allergies   . Sleep apnea    uses CPAP nightly   Past Surgical History:  Procedure Laterality Date  . COLONOSCOPY  2012  . COLONOSCOPY WITH PROPOFOL N/A 10/04/2015   Procedure: COLONOSCOPY WITH PROPOFOL;  Surgeon: Doran Stabler, MD;  Location: [facility];  Service: Gastroenterology;  Laterality: N/A;  . DIRECT LARYNGOSCOPY N/A 08/20/2014   Procedure: DIRECT LARYNGOSCOPY;  Surgeon: Rozetta Nunnery, MD;  Location: [place];  Service: ENT;  Laterality: N/A;  . DIRECT LARYNGOSCOPY N/A 08/21/2016   Procedure: DIRECT LARYNGOSCOPY;  Surgeon: Rozetta Nunnery,  MD;  Location: [place];  Service: ENT;  Laterality: N/A;  . ESOPHAGOSCOPY WITH DILITATION N/A 08/20/2014   Procedure: ESOPHAGOSCOPY WITH DILATATION;  Surgeon: Rozetta Nunnery, MD;  Location: [place];  Service: ENT;  Laterality: N/A;  . ESOPHAGOSCOPY WITH DILITATION N/A 08/21/2016   Procedure: ESOPHAGOSCOPY WITH DILITATION;  Surgeon: Rozetta Nunnery, MD;  Location: [place];  Service: ENT;  Laterality: N/A;  . KNEE ARTHROPLASTY Right   . KNEE ARTHROSCOPY Right    x 3  . KNEE ARTHROSCOPY Left 10/28/2015   Procedure: ARTHROSCOPY KNEE;  Surgeon: Dorna Leitz, MD;  Location: [facility];  Service: Orthopedics;  Laterality: Left;  left knee partial medial meniscectomy and patellofemoral chondroplasty  . LAPAROSCOPIC GASTRIC BYPASS  2004  . LUMBAR LAMINECTOMY/DECOMPRESSION MICRODISCECTOMY  05/24/2011   Procedure: LUMBAR LAMINECTOMY/DECOMPRESSION MICRODISCECTOMY;  Surgeon: Winfield Cunas, MD;  Location: [facility];  Service: Neurosurgery;  Laterality: Right;  RIGHT L5-S1 Laminectomy, discectomy  . LYMPHADENECTOMY Right    cervical  . MULTIPLE TOOTH EXTRACTIONS    . PORT-A-CATH REMOVAL    . PORTACATH PLACEMENT    . REPLACEMENT TOTAL KNEE Right 03/22/2004  . SHOULDER ARTHROSCOPY Left   . SHOULDER OPEN ROTATOR CUFF REPAIR Right    Social History   Socioeconomic History  . Marital status: Married    Spouse name: [name]  . Number of children: Not on file  . Years of education: Not on  file  . Highest education level: Not on file  Occupational History  . Occupation: Information systems manager: [employer]  Tobacco Use  . Smoking status: Former Smoker    Packs/day: 0.00    Years: 25.00    Pack years: 0.00    Quit date: 10/25/2002    Years since quitting: 17.3  . Smokeless tobacco: Never Used  Vaping Use  . Vaping Use: Never used  Substance and Sexual Activity  . Alcohol use: No    Alcohol/week: 3.0 - 4.0 standard drinks    Types: 3 - 4 Standard drinks or equivalent  per week    Comment: 08/17/16- " no desire in past 3- 4 months  . Drug use: No  . Sexual activity: Yes  Other Topics Concern  . Not on file  Social History Narrative  . Not on file   Social Determinants of Health   Financial Resource Strain:   . Difficulty of Paying Living Expenses: Not on file  Food Insecurity:   . Worried About Charity fundraiser in the Last Year: Not on file  . Ran Out of Food in the Last Year: Not on file  Transportation Needs:   . Lack of Transportation (Medical): Not on file  . Lack of Transportation (Non-Medical): Not on file  Physical Activity:   . Days of Exercise per Week: Not on file  . Minutes of Exercise per Session: Not on file  Stress:   . Feeling of Stress : Not on file  Social Connections:   . Frequency of Communication with Friends and Family: Not on file  . Frequency of Social Gatherings with Friends and Family: Not on file  . Attends Religious Services: Not on file  . Active Member of Clubs or Organizations: Not on file  . Attends Archivist Meetings: Not on file  . Marital Status: Not on file   Family History  Problem Relation Age of Onset  . Colon cancer Paternal Uncle   . Heart attack Maternal Uncle         X 2; both pre 55  . Hypertension Mother   . Breast cancer Mother   . Coronary artery disease Mother   . Brain cancer Paternal Uncle   . Diabetes Maternal Grandfather   . Hypertension Maternal Grandfather        ? metastatic; ? primary  . Diabetes Father   . Stroke Father 35  . Dementia Father    Allergies  Allergen Reactions  . Sulfa Antibiotics Rash   Prior to Admission medications   Medication Sig Start Date End Date Taking? Authorizing Provider  amLODipine-benazepril (LOTREL) 10-40 MG capsule TAKE 1 CAPSULE BY MOUTH DAILY 10/13/19  Yes Burns, Claudina Lick, MD  aspirin EC 81 MG tablet Take 81 mg by mouth every evening.    Yes [provider]  cetirizine (ZYRTEC) 10 MG tablet Take 10 mg by mouth daily.    Yes [provider]  fluticasone (FLONASE) 50 MCG/ACT nasal spray Place 2 sprays into the nose as directed. At bedtime Patient taking differently: Place 2 sprays into the nose at bedtime.  11/20/11  Yes Hendricks Limes, MD  ketoconazole (NIZORAL) 2 % shampoo Apply 1 application topically 2 (two) times a week. 07/04/18  Yes [provider]  levothyroxine (SYNTHROID) 200 MCG tablet TAKE 1 TABLET BY MOUTH DAILY BEFORE BREAKFAST 01/29/20  Yes Burns, Claudina Lick, MD  LORazepam (ATIVAN) 0.5 MG tablet TAKE 1 TABLET BY  MOUTH DAILY AS NEEDED FOR ANXIETY 09/04/19  Yes Burns, Claudina Lick, MD  metFORMIN (GLUCOPHAGE) 500 MG tablet TAKE 1 TABLET BY MOUTH TWICE DAILY WITH MEALS 03/19/19  Yes Burns, Claudina Lick, MD  metoprolol succinate (TOPROL XL) 100 MG 24 hr tablet Take 1 tablet (100 mg total) by mouth daily. Take with or immediately following a meal. 10/20/19  Yes Burns, Claudina Lick, MD  metoprolol succinate (TOPROL-XL) 50 MG 24 hr tablet Take 1 tablet (50 mg total) by mouth daily. Take with or immediately following a meal.  Total of 150 mg daily 01/25/20  Yes Burns, Claudina Lick, MD  Multiple Vitamins-Minerals (MULTIVITAMIN WITH MINERALS) tablet Take 1 tablet by mouth daily.    Yes [provider]  OZEMPIC, 1 MG/DOSE, 2 MG/1.5ML SOPN INJECT 1 MG INTO THE SKIN ONCE A WEEK 11/16/19  Yes Burns, Claudina Lick, MD  pantoprazole (PROTONIX) 40 MG tablet TAKE 1 TABLET BY MOUTH DAILY 12/23/19  Yes Burns, Claudina Lick, MD  pravastatin (PRAVACHOL) 40 MG tablet TAKE 1 TABLET BY MOUTH DAILY 02/12/20  Yes Burns, Claudina Lick, MD  tolterodine (DETROL LA) 4 MG 24 hr capsule Take 4 mg by mouth every evening.    Yes [provider]  TURMERIC PO Take by mouth.   Yes [provider]     Positive ROS: Otherwise negative  All other systems have been reviewed and were otherwise negative with the exception of those mentioned in the HPI and as above.  Physical Exam: Constitutional: Alert, well-appearing, no acute  distress Ears: External ears without lesions or tenderness. Ear canals are clear bilaterally with intact, clear TMs.  Nasal: External nose without lesions.. Clear nasal passages Oral: Lips and gums without lesions. Tongue and palate mucosa without lesions. Posterior oropharynx clear.  Indirect laryngoscopy reveals mild supraglottic edema but clear hypopharynx and larynx otherwise with no mucosal abnormalities. Neck: No palpable adenopathy or masses. Lungs clear to auscultation Cardiac exam: Regular rate and rhythm without murmur but tachycardic. Respiratory: Breathing comfortably  Skin: No facial/neck lesions or rash noted.  Procedures  Assessment: Esophageal stricture status post chemoradiation treatment for tonsil cancer in 2005.  Plan: We will plan on scheduling patient for direct laryngoscopy, esophagoscopy and dilation of esophageal stricture with savory dilators.   Radene Journey, MD

## 2020-02-24 ENCOUNTER — Other Ambulatory Visit: Payer: Self-pay | Admitting: Internal Medicine

## 2020-03-04 ENCOUNTER — Encounter (HOSPITAL_BASED_OUTPATIENT_CLINIC_OR_DEPARTMENT_OTHER): Payer: Self-pay | Admitting: Otolaryngology

## 2020-03-04 ENCOUNTER — Other Ambulatory Visit: Payer: Self-pay

## 2020-03-07 ENCOUNTER — Ambulatory Visit (INDEPENDENT_AMBULATORY_CARE_PROVIDER_SITE_OTHER): Payer: Self-pay | Admitting: Otolaryngology

## 2020-03-07 DIAGNOSIS — K222 Esophageal obstruction: Secondary | ICD-10-CM

## 2020-03-07 NOTE — H&P (View-Only) (Signed)
PREOPERATIVE H&P  Chief Complaint: Difficulty swallowing.  HPI: Patrick Butler is a 59 y.o. male who presents for evaluation of difficulty swallowing.  Patient complains of difficulty getting food down past the upper cervical esophagus.  Patient has a previous history of tonsil cancer status post chemoradiation treatment 15 years ago.  He subsequently developed esophageal stricture requiring dilations performed in 2016 and 2018. He is having difficulty swallowing again and would like to have the stricture dilated.  Past Medical History:  Diagnosis Date  . Anxiety   . Arthritis    right knee  . Cancer ([address]) 2005   tonsils/Chemo and radiation  . Dysphagia 08/2014  . Dyspnea    with exertion  . Full dentures   . GERD (gastroesophageal reflux disease)   . History of chemotherapy   . History of radiation therapy    neck  . Hyperlipidemia   . Hypertension    states under control with med., has been on med. ~ 5 yr.  . Hypothyroidism   . Non-insulin dependent type 2 diabetes mellitus ([name])   . Posterior neck pain   . Seasonal allergies   . Sleep apnea    uses CPAP nightly   Past Surgical History:  Procedure Laterality Date  . COLONOSCOPY  2012  . COLONOSCOPY WITH PROPOFOL N/A 10/04/2015   Procedure: COLONOSCOPY WITH PROPOFOL;  Surgeon: Doran Stabler, MD;  [facility];  Service: Gastroenterology;  Laterality: N/A;  . DIRECT LARYNGOSCOPY N/A 08/20/2014   Procedure: DIRECT LARYNGOSCOPY;  Surgeon: Rozetta Nunnery, MD;  Location: [facility];  Service: ENT;  Laterality: N/A;  . DIRECT LARYNGOSCOPY N/A 08/21/2016   Procedure: DIRECT LARYNGOSCOPY;  Surgeon: Rozetta Nunnery, MD;  Location: [facility];  Service: ENT;  Laterality: N/A;  . ESOPHAGOSCOPY WITH DILITATION N/A 08/20/2014   Procedure: ESOPHAGOSCOPY WITH DILATATION;  Surgeon: Rozetta Nunnery, MD;  Location: [place];  Service: ENT;  Laterality: N/A;  . ESOPHAGOSCOPY WITH DILITATION  N/A 08/21/2016   Procedure: ESOPHAGOSCOPY WITH DILITATION;  Surgeon: Rozetta Nunnery, MD;  Location: [place];  Service: ENT;  Laterality: N/A;  . KNEE ARTHROPLASTY Right   . KNEE ARTHROSCOPY Right    x 3  . KNEE ARTHROSCOPY Left 10/28/2015   Procedure: ARTHROSCOPY KNEE;  Surgeon: Dorna Leitz, MD;  Location: [facility];  Service: Orthopedics;  Laterality: Left;  left knee partial medial meniscectomy and patellofemoral chondroplasty  . LAPAROSCOPIC GASTRIC BYPASS  2004  . LUMBAR LAMINECTOMY/DECOMPRESSION MICRODISCECTOMY  05/24/2011   Procedure: LUMBAR LAMINECTOMY/DECOMPRESSION MICRODISCECTOMY;  Surgeon: Winfield Cunas, MD;  Location: [facility];  Service: Neurosurgery;  Laterality: Right;  RIGHT L5-S1 Laminectomy, discectomy  . LYMPHADENECTOMY Right    cervical  . MULTIPLE TOOTH EXTRACTIONS    . PORT-A-CATH REMOVAL    . PORTACATH PLACEMENT    . REPLACEMENT TOTAL KNEE Right 03/22/2004  . SHOULDER ARTHROSCOPY Left   . SHOULDER OPEN ROTATOR CUFF REPAIR Right    Social History   Socioeconomic History  . Marital status: Married    Spouse name: [name]  . Number of children: Not on file  . Years of education: Not on file  . Highest education level: Not on file  Occupational History  . Occupation: Information systems manager: [employer]  Tobacco Use  . Smoking status: Former Smoker    Packs/day: 0.00    Years: 25.00    Pack years: 0.00    Quit date: 10/25/2002  Years since quitting: 17.3  . Smokeless tobacco: Never Used  Vaping Use  . Vaping Use: Never used  Substance and Sexual Activity  . Alcohol use: Not Currently    Alcohol/week: 3.0 - 4.0 standard drinks    Types: 3 - 4 Standard drinks or equivalent per week    Comment: 08/17/16- " no desire in past 3- 4 months  . Drug use: No  . Sexual activity: Yes  Other Topics Concern  . Not on file  Social History Narrative  . Not on file   Social Determinants of Health   Financial Resource Strain:   . Difficulty of Paying  Living Expenses: Not on file  Food Insecurity:   . Worried About Charity fundraiser in the Last Year: Not on file  . Ran Out of Food in the Last Year: Not on file  Transportation Needs:   . Lack of Transportation (Medical): Not on file  . Lack of Transportation (Non-Medical): Not on file  Physical Activity:   . Days of Exercise per Week: Not on file  . Minutes of Exercise per Session: Not on file  Stress:   . Feeling of Stress : Not on file  Social Connections:   . Frequency of Communication with Friends and Family: Not on file  . Frequency of Social Gatherings with Friends and Family: Not on file  . Attends Religious Services: Not on file  . Active Member of Clubs or Organizations: Not on file  . Attends Archivist Meetings: Not on file  . Marital Status: Not on file   Family History  Problem Relation Age of Onset  . Colon cancer Paternal Uncle   . Heart attack Maternal Uncle         X 2; both pre 55  . Hypertension Mother   . Breast cancer Mother   . Coronary artery disease Mother   . Brain cancer Paternal Uncle   . Diabetes Maternal Grandfather   . Hypertension Maternal Grandfather        ? metastatic; ? primary  . Diabetes Father   . Stroke Father 3  . Dementia Father    Allergies  Allergen Reactions  . Sulfa Antibiotics Rash   Prior to Admission medications   Medication Sig Start Date End Date Taking? Authorizing Provider  amLODipine-benazepril (LOTREL) 10-40 MG capsule TAKE 1 CAPSULE BY MOUTH DAILY 10/13/19   Binnie Rail, MD  aspirin EC 81 MG tablet Take 81 mg by mouth every evening.     [provider]  cetirizine (ZYRTEC) 10 MG tablet Take 10 mg by mouth daily.    [provider]  fluticasone (FLONASE) 50 MCG/ACT nasal spray Place 2 sprays into the nose as directed. At bedtime Patient taking differently: Place 2 sprays into the nose at bedtime.  11/20/11   Hendricks Limes, MD  ketoconazole (NIZORAL) 2 % shampoo Apply 1 application  topically 2 (two) times a week. 07/04/18   [provider]  levothyroxine (SYNTHROID) 200 MCG tablet TAKE 1 TABLET BY MOUTH DAILY BEFORE BREAKFAST 01/29/20   Burns, Claudina Lick, MD  LORazepam (ATIVAN) 0.5 MG tablet TAKE 1 TABLET BY MOUTH DAILY AS NEEDED FOR ANXIETY 09/04/19   Burns, Claudina Lick, MD  metFORMIN (GLUCOPHAGE) 500 MG tablet TAKE 1 TABLET BY MOUTH TWICE DAILY WITH MEALS 03/19/19   Burns, Claudina Lick, MD  metoprolol succinate (TOPROL XL) 100 MG 24 hr tablet Take 1 tablet (100 mg total) by mouth daily. Take with or  immediately following a meal. 10/20/19   Burns, Claudina Lick, MD  metoprolol succinate (TOPROL-XL) 50 MG 24 hr tablet Take 1 tablet (50 mg total) by mouth daily. Take with or immediately following a meal.  Total of 150 mg daily 01/25/20   Binnie Rail, MD  Multiple Vitamins-Minerals (MULTIVITAMIN WITH MINERALS) tablet Take 1 tablet by mouth daily.     [provider]  OZEMPIC, 1 MG/DOSE, 2 MG/1.5ML SOPN INJECT 1 MG INTO THE SKIN ONCE A WEEK 02/24/20   Burns, Claudina Lick, MD  pantoprazole (PROTONIX) 40 MG tablet TAKE 1 TABLET BY MOUTH DAILY 12/23/19   Binnie Rail, MD  pravastatin (PRAVACHOL) 40 MG tablet TAKE 1 TABLET BY MOUTH DAILY 02/12/20   Binnie Rail, MD  tolterodine (DETROL LA) 4 MG 24 hr capsule Take 4 mg by mouth every evening.     [provider]  TURMERIC PO Take by mouth.    [provider]     Positive ROS: Otherwise negative  All other systems have been reviewed and were otherwise negative with the exception of those mentioned in the HPI and as above.  Physical Exam: There were no vitals filed for this visit.  General: Alert, no acute distress Oral: Normal oral mucosa and tonsils Nasal: Clear nasal passages Neck: No palpable adenopathy or thyroid nodules Ear: Ear canal is clear with normal appearing TMs Cardiovascular: Regular rate and rhythm, no murmur.  Respiratory: Clear to auscultation Neurologic: Alert and oriented x  3   Assessment/Plan: Difficulty swallowing secondary to esophageal stricture  Plan for direct laryngoscopy, esophagoscopy and dilation of esophageal stricture with savory dilators.   Melony Overly, MD 03/07/2020 12:39 PM

## 2020-03-07 NOTE — H&P (Signed)
PREOPERATIVE H&P  Chief Complaint: Difficulty swallowing.  HPI: Patrick Butler is a 59 y.o. male who presents for evaluation of difficulty swallowing.  Patient complains of difficulty getting food down past the upper cervical esophagus.  Patient has a previous history of tonsil cancer status post chemoradiation treatment 15 years ago.  He subsequently developed esophageal stricture requiring dilations performed in 2016 and 2018. He is having difficulty swallowing again and would like to have the stricture dilated.  Past Medical History:  Diagnosis Date  . Anxiety   . Arthritis    right knee  . Cancer ([place]) 2005   tonsils/Chemo and radiation  . Dysphagia 08/2014  . Dyspnea    with exertion  . Full dentures   . GERD (gastroesophageal reflux disease)   . History of chemotherapy   . History of radiation therapy    neck  . Hyperlipidemia   . Hypertension    states under control with med., has been on med. ~ 5 yr.  . Hypothyroidism   . Non-insulin dependent type 2 diabetes mellitus ([place])   . Posterior neck pain   . Seasonal allergies   . Sleep apnea    uses CPAP nightly   Past Surgical History:  Procedure Laterality Date  . COLONOSCOPY  2012  . COLONOSCOPY WITH PROPOFOL N/A 10/04/2015   Procedure: COLONOSCOPY WITH PROPOFOL;  Surgeon: Doran Stabler, MD;  Location: [facility];  Service: Gastroenterology;  Laterality: N/A;  . DIRECT LARYNGOSCOPY N/A 08/20/2014   Procedure: DIRECT LARYNGOSCOPY;  Surgeon: Rozetta Nunnery, MD;  Location: [facility];  Service: ENT;  Laterality: N/A;  . DIRECT LARYNGOSCOPY N/A 08/21/2016   Procedure: DIRECT LARYNGOSCOPY;  Surgeon: Rozetta Nunnery, MD;  Location: [place];  Service: ENT;  Laterality: N/A;  . ESOPHAGOSCOPY WITH DILITATION N/A 08/20/2014   Procedure: ESOPHAGOSCOPY WITH DILATATION;  Surgeon: Rozetta Nunnery, MD;  Location: [place];  Service: ENT;  Laterality: N/A;  . ESOPHAGOSCOPY WITH DILITATION  N/A 08/21/2016   Procedure: ESOPHAGOSCOPY WITH DILITATION;  Surgeon: Rozetta Nunnery, MD;  Location: [place];  Service: ENT;  Laterality: N/A;  . KNEE ARTHROPLASTY Right   . KNEE ARTHROSCOPY Right    x 3  . KNEE ARTHROSCOPY Left 10/28/2015   Procedure: ARTHROSCOPY KNEE;  Surgeon: Dorna Leitz, MD;  Location: [facility];  Service: Orthopedics;  Laterality: Left;  left knee partial medial meniscectomy and patellofemoral chondroplasty  . LAPAROSCOPIC GASTRIC BYPASS  2004  . LUMBAR LAMINECTOMY/DECOMPRESSION MICRODISCECTOMY  05/24/2011   Procedure: LUMBAR LAMINECTOMY/DECOMPRESSION MICRODISCECTOMY;  Surgeon: Winfield Cunas, MD;  Location: [facility];  Service: Neurosurgery;  Laterality: Right;  RIGHT L5-S1 Laminectomy, discectomy  . LYMPHADENECTOMY Right    cervical  . MULTIPLE TOOTH EXTRACTIONS    . PORT-A-CATH REMOVAL    . PORTACATH PLACEMENT    . REPLACEMENT TOTAL KNEE Right 03/22/2004  . SHOULDER ARTHROSCOPY Left   . SHOULDER OPEN ROTATOR CUFF REPAIR Right    Social History   Socioeconomic History  . Marital status: Married    Spouse name: [name]  . Number of children: Not on file  . Years of education: Not on file  . Highest education level: Not on file  Occupational History  . Occupation: Information systems manager: [employer]  Tobacco Use  . Smoking status: Former Smoker    Packs/Butler: 0.00    Years: 25.00    Pack years: 0.00    Quit date: 10/25/2002  Years since quitting: 17.3  . Smokeless tobacco: Never Used  Vaping Use  . Vaping Use: Never used  Substance and Sexual Activity  . Alcohol use: Not Currently    Alcohol/week: 3.0 - 4.0 standard drinks    Types: 3 - 4 Standard drinks or equivalent per week    Comment: 08/17/16- " no desire in past 3- 4 months  . Drug use: No  . Sexual activity: Yes  Other Topics Concern  . Not on file  Social History Narrative  . Not on file   Social Determinants of Health   Financial Resource Strain:   . Difficulty of Paying  Living Expenses: Not on file  Food Insecurity:   . Worried About Charity fundraiser in the Last Year: Not on file  . Ran Out of Food in the Last Year: Not on file  Transportation Needs:   . Lack of Transportation (Medical): Not on file  . Lack of Transportation (Non-Medical): Not on file  Physical Activity:   . Days of Exercise per Week: Not on file  . Minutes of Exercise per Session: Not on file  Stress:   . Feeling of Stress : Not on file  Social Connections:   . Frequency of Communication with Friends and Family: Not on file  . Frequency of Social Gatherings with Friends and Family: Not on file  . Attends Religious Services: Not on file  . Active Member of Clubs or Organizations: Not on file  . Attends Archivist Meetings: Not on file  . Marital Status: Not on file   Family History  Problem Relation Age of Onset  . Colon cancer Paternal Uncle   . Heart attack Maternal Uncle         X 2; both pre 55  . Hypertension Mother   . Breast cancer Mother   . Coronary artery disease Mother   . Brain cancer Paternal Uncle   . Diabetes Maternal Grandfather   . Hypertension Maternal Grandfather        ? metastatic; ? primary  . Diabetes Father   . Stroke Father 19  . Dementia Father    Allergies  Allergen Reactions  . Sulfa Antibiotics Rash   Prior to Admission medications   Medication Sig Start Date End Date Taking? Authorizing Provider  amLODipine-benazepril (LOTREL) 10-40 MG capsule TAKE 1 CAPSULE BY MOUTH DAILY 10/13/19   Binnie Rail, MD  aspirin EC 81 MG tablet Take 81 mg by mouth every evening.     [provider]  cetirizine (ZYRTEC) 10 MG tablet Take 10 mg by mouth daily.    [provider]  fluticasone (FLONASE) 50 MCG/ACT nasal spray Place 2 sprays into the nose as directed. At bedtime Patient taking differently: Place 2 sprays into the nose at bedtime.  11/20/11   Hendricks Limes, MD  ketoconazole (NIZORAL) 2 % shampoo Apply 1 application  topically 2 (two) times a week. 07/04/18   [provider]  levothyroxine (SYNTHROID) 200 MCG tablet TAKE 1 TABLET BY MOUTH DAILY BEFORE BREAKFAST 01/29/20   Burns, Claudina Lick, MD  LORazepam (ATIVAN) 0.5 MG tablet TAKE 1 TABLET BY MOUTH DAILY AS NEEDED FOR ANXIETY 09/04/19   Burns, Claudina Lick, MD  metFORMIN (GLUCOPHAGE) 500 MG tablet TAKE 1 TABLET BY MOUTH TWICE DAILY WITH MEALS 03/19/19   Burns, Claudina Lick, MD  metoprolol succinate (TOPROL XL) 100 MG 24 hr tablet Take 1 tablet (100 mg total) by mouth daily. Take with or  immediately following a meal. 10/20/19   Burns, Claudina Lick, MD  metoprolol succinate (TOPROL-XL) 50 MG 24 hr tablet Take 1 tablet (50 mg total) by mouth daily. Take with or immediately following a meal.  Total of 150 mg daily 01/25/20   Binnie Rail, MD  Multiple Vitamins-Minerals (MULTIVITAMIN WITH MINERALS) tablet Take 1 tablet by mouth daily.     [provider]  OZEMPIC, 1 MG/DOSE, 2 MG/1.5ML SOPN INJECT 1 MG INTO THE SKIN ONCE A WEEK 02/24/20   Burns, Claudina Lick, MD  pantoprazole (PROTONIX) 40 MG tablet TAKE 1 TABLET BY MOUTH DAILY 12/23/19   Binnie Rail, MD  pravastatin (PRAVACHOL) 40 MG tablet TAKE 1 TABLET BY MOUTH DAILY 02/12/20   Binnie Rail, MD  tolterodine (DETROL LA) 4 MG 24 hr capsule Take 4 mg by mouth every evening.     [provider]  TURMERIC PO Take by mouth.    [provider]     Positive ROS: Otherwise negative  All other systems have been reviewed and were otherwise negative with the exception of those mentioned in the HPI and as above.  Physical Exam: There were no vitals filed for this visit.  General: Alert, no acute distress Oral: Normal oral mucosa and tonsils Nasal: Clear nasal passages Neck: No palpable adenopathy or thyroid nodules Ear: Ear canal is clear with normal appearing TMs Cardiovascular: Regular rate and rhythm, no murmur.  Respiratory: Clear to auscultation Neurologic: Alert and oriented x  3   Assessment/Plan: Difficulty swallowing secondary to esophageal stricture  Plan for direct laryngoscopy, esophagoscopy and dilation of esophageal stricture with savory dilators.   Melony Overly, MD 03/07/2020 12:39 PM

## 2020-03-08 ENCOUNTER — Other Ambulatory Visit (HOSPITAL_COMMUNITY)
Admission: RE | Admit: 2020-03-08 | Discharge: 2020-03-08 | Disposition: A | Discharge status: Home / Self Care | Payer: BC Managed Care – PPO | Source: Ambulatory Visit | Attending: Otolaryngology | Admitting: Otolaryngology

## 2020-03-08 ENCOUNTER — Encounter (HOSPITAL_BASED_OUTPATIENT_CLINIC_OR_DEPARTMENT_OTHER)
Admission: RE | Admit: 2020-03-08 | Discharge: 2020-03-08 | Disposition: A | Discharge status: Home / Self Care | Payer: BC Managed Care – PPO | Source: Ambulatory Visit | Attending: Otolaryngology | Admitting: Otolaryngology

## 2020-03-08 DIAGNOSIS — Z01818 Encounter for other preprocedural examination: Secondary | ICD-10-CM | POA: Insufficient documentation

## 2020-03-08 DIAGNOSIS — Z01812 Encounter for preprocedural laboratory examination: Secondary | ICD-10-CM | POA: Diagnosis not present

## 2020-03-08 DIAGNOSIS — Z20822 Contact with and (suspected) exposure to covid-19: Secondary | ICD-10-CM | POA: Diagnosis not present

## 2020-03-08 LAB — BASIC METABOLIC PANEL
Anion gap: 9 (ref 5–15)
BUN: 18 mg/dL (ref 8–23)
CO2: 26 mmol/L (ref 22–32)
Calcium: 9.5 mg/dL (ref 8.9–10.3)
Chloride: 104 mmol/L (ref 98–111)
Creatinine, Ser: 1.31 mg/dL — ABNORMAL HIGH (ref 0.61–1.24)
GFR, Estimated: >60 mL/min (ref >60)
Glucose, Bld: 114 mg/dL — ABNORMAL HIGH (ref 70–99)
Potassium: 5.3 mmol/L — ABNORMAL HIGH (ref 3.5–5.1)
Sodium: 139 mmol/L (ref 135–145)

## 2020-03-08 LAB — SARS CORONAVIRUS 2 (TAT 6-24 HRS): SARS Coronavirus 2: NEGATIVE

## 2020-03-08 NOTE — Progress Notes (Signed)
K+ 5.3, Dr. Sabra Heck aware, will proceed with surgery as scheduled.

## 2020-03-10 NOTE — Anesthesia Preprocedure Evaluation (Addendum)
Anesthesia Evaluation  Patient identified by MRN, date of birth, ID band Patient awake    Reviewed: Allergy & Precautions, NPO status , Patient's Chart, lab work & pertinent test results  History of Anesthesia Complications Negative for: history of anesthetic complications  Airway Mallampati: I  TM Distance: >3 FB Neck ROM: Full    Dental  (+) Edentulous Upper, Edentulous Lower, Dental Advisory Given   Pulmonary sleep apnea and Continuous Positive Airway Pressure Ventilation , former smoker,    Pulmonary exam normal        Cardiovascular hypertension, Pt. on medications and Pt. on home beta blockers Normal cardiovascular exam     Neuro/Psych PSYCHIATRIC DISORDERS Anxiety    GI/Hepatic Neg liver ROS, GERD  Controlled,h/o gastric bypass   Endo/Other  diabetes, Type 2, Oral Hypoglycemic AgentsHypothyroidism Morbid obesity  Renal/GU negative Renal ROS     Musculoskeletal  (+) Arthritis ,   Abdominal (+) + obese,   Peds  Hematology   Anesthesia Other Findings h/o tonsillar cancer: Chemo, XRT  Reproductive/Obstetrics                            Anesthesia Physical  Anesthesia Plan  ASA: III  Anesthesia Plan: General   Post-op Pain Management:    Induction: Intravenous  PONV Risk Score and Plan: 3 and Ondansetron, Dexamethasone and Midazolam  Airway Management Planned: Oral ETT  Additional Equipment: None  Intra-op Plan:   Post-operative Plan: Extubation in OR  Informed Consent: I have reviewed the patients History and Physical, chart, labs and discussed the procedure including the risks, benefits and alternatives for the proposed anesthesia with the patient or authorized representative who has indicated his/her understanding and acceptance.     Dental advisory given  Plan Discussed with: Anesthesiologist  Anesthesia Plan Comments:        Anesthesia Quick Evaluation

## 2020-03-11 ENCOUNTER — Ambulatory Visit (HOSPITAL_BASED_OUTPATIENT_CLINIC_OR_DEPARTMENT_OTHER): Payer: BC Managed Care – PPO | Admitting: Anesthesiology

## 2020-03-11 ENCOUNTER — Encounter (HOSPITAL_BASED_OUTPATIENT_CLINIC_OR_DEPARTMENT_OTHER)
Admission: RE | Disposition: A | Discharge status: Home / Self Care | Payer: Self-pay | Source: Home / Self Care | Attending: Otolaryngology

## 2020-03-11 ENCOUNTER — Ambulatory Visit (HOSPITAL_BASED_OUTPATIENT_CLINIC_OR_DEPARTMENT_OTHER)
Admission: RE | Admit: 2020-03-11 | Discharge: 2020-03-11 | Disposition: A | Discharge status: Home / Self Care | Payer: BC Managed Care – PPO | Attending: Otolaryngology | Admitting: Otolaryngology

## 2020-03-11 ENCOUNTER — Other Ambulatory Visit: Payer: Self-pay

## 2020-03-11 ENCOUNTER — Encounter (HOSPITAL_BASED_OUTPATIENT_CLINIC_OR_DEPARTMENT_OTHER): Payer: Self-pay | Admitting: Otolaryngology

## 2020-03-11 DIAGNOSIS — Z9989 Dependence on other enabling machines and devices: Secondary | ICD-10-CM | POA: Diagnosis not present

## 2020-03-11 DIAGNOSIS — E785 Hyperlipidemia, unspecified: Secondary | ICD-10-CM | POA: Diagnosis not present

## 2020-03-11 DIAGNOSIS — K222 Esophageal obstruction: Secondary | ICD-10-CM | POA: Diagnosis not present

## 2020-03-11 DIAGNOSIS — Z85818 Personal history of malignant neoplasm of other sites of lip, oral cavity, and pharynx: Secondary | ICD-10-CM | POA: Insufficient documentation

## 2020-03-11 DIAGNOSIS — G4733 Obstructive sleep apnea (adult) (pediatric): Secondary | ICD-10-CM | POA: Diagnosis not present

## 2020-03-11 HISTORY — PX: ESOPHAGEAL DILATION: SHX303

## 2020-03-11 HISTORY — PX: DIRECT LARYNGOSCOPY: SHX5326

## 2020-03-11 LAB — GLUCOSE, CAPILLARY
Glucose-Capillary: 119 mg/dL — ABNORMAL HIGH (ref 70–99)
Glucose-Capillary: 107 mg/dL — ABNORMAL HIGH (ref 70–99)

## 2020-03-11 SURGERY — LARYNGOSCOPY, DIRECT
Anesthesia: General | Site: Throat

## 2020-03-11 MED ORDER — CHLORHEXIDINE GLUCONATE CLOTH 2 % EX PADS: 6.0000 | MEDICATED_PAD | Freq: Once | CUTANEOUS | Status: DC

## 2020-03-11 MED ORDER — LIDOCAINE 2% (20 MG/ML) 5 ML SYRINGE
INTRAMUSCULAR | Status: DC | PRN
  Administered 2020-03-11: 100 mg via INTRAVENOUS

## 2020-03-11 MED ORDER — ACETAMINOPHEN 500 MG PO TABS
1000.0000 mg | ORAL_TABLET | Freq: Once | ORAL | Status: AC
  Administered 2020-03-11: 1000 mg via ORAL

## 2020-03-11 MED ORDER — ONDANSETRON HCL 4 MG/2ML IJ SOLN
INTRAMUSCULAR | Status: AC
  Filled 2020-03-11: qty 2

## 2020-03-11 MED ORDER — FENTANYL CITRATE (PF) 100 MCG/2ML IJ SOLN: 25.0000 ug | INTRAMUSCULAR | Status: DC | PRN

## 2020-03-11 MED ORDER — PROPOFOL 10 MG/ML IV BOLUS
INTRAVENOUS | Status: DC | PRN
  Administered 2020-03-11: 200 mg via INTRAVENOUS

## 2020-03-11 MED ORDER — MIDAZOLAM HCL 2 MG/2ML IJ SOLN
INTRAMUSCULAR | Status: AC
  Filled 2020-03-11: qty 2

## 2020-03-11 MED ORDER — FENTANYL CITRATE (PF) 100 MCG/2ML IJ SOLN
INTRAMUSCULAR | Status: AC
  Filled 2020-03-11: qty 2

## 2020-03-11 MED ORDER — PROMETHAZINE HCL 25 MG/ML IJ SOLN: 6.2500 mg | INTRAMUSCULAR | Status: DC | PRN

## 2020-03-11 MED ORDER — ACETAMINOPHEN 500 MG PO TABS
ORAL_TABLET | ORAL | Status: AC
  Filled 2020-03-11: qty 2

## 2020-03-11 MED ORDER — CEFAZOLIN SODIUM-DEXTROSE 2-4 GM/100ML-% IV SOLN
INTRAVENOUS | Status: AC
  Filled 2020-03-11: qty 100

## 2020-03-11 MED ORDER — SUCCINYLCHOLINE CHLORIDE 200 MG/10ML IV SOSY
PREFILLED_SYRINGE | INTRAVENOUS | Status: DC | PRN
  Administered 2020-03-11: 140 mg via INTRAVENOUS

## 2020-03-11 MED ORDER — CEFAZOLIN SODIUM-DEXTROSE 2-4 GM/100ML-% IV SOLN: 2.0000 g | INTRAVENOUS | Status: DC

## 2020-03-11 MED ORDER — DEXTROSE 5 % IV SOLN
3.0000 g | INTRAVENOUS | Status: AC
  Administered 2020-03-11: 3 g via INTRAVENOUS
  Filled 2020-03-11: qty 3

## 2020-03-11 MED ORDER — PHENYLEPHRINE 40 MCG/ML (10ML) SYRINGE FOR IV PUSH (FOR BLOOD PRESSURE SUPPORT)
PREFILLED_SYRINGE | INTRAVENOUS | Status: DC | PRN
  Administered 2020-03-11: 80 ug via INTRAVENOUS
  Administered 2020-03-11: 120 ug via INTRAVENOUS
  Administered 2020-03-11: 80 ug via INTRAVENOUS

## 2020-03-11 MED ORDER — CEFAZOLIN SODIUM-DEXTROSE 1-4 GM/50ML-% IV SOLN
INTRAVENOUS | Status: AC
  Filled 2020-03-11: qty 50

## 2020-03-11 MED ORDER — DEXAMETHASONE SODIUM PHOSPHATE 10 MG/ML IJ SOLN
INTRAMUSCULAR | Status: DC | PRN
  Administered 2020-03-11: 10 mg via INTRAVENOUS

## 2020-03-11 MED ORDER — DEXAMETHASONE SODIUM PHOSPHATE 10 MG/ML IJ SOLN
INTRAMUSCULAR | Status: AC
  Filled 2020-03-11: qty 1

## 2020-03-11 MED ORDER — PROPOFOL 500 MG/50ML IV EMUL
INTRAVENOUS | Status: AC
  Filled 2020-03-11: qty 50

## 2020-03-11 MED ORDER — ONDANSETRON HCL 4 MG/2ML IJ SOLN
INTRAMUSCULAR | Status: DC | PRN
  Administered 2020-03-11: 4 mg via INTRAVENOUS

## 2020-03-11 MED ORDER — ROCURONIUM BROMIDE 10 MG/ML (PF) SYRINGE
PREFILLED_SYRINGE | INTRAVENOUS | Status: AC
  Filled 2020-03-11: qty 10

## 2020-03-11 MED ORDER — MIDAZOLAM HCL 2 MG/2ML IJ SOLN
INTRAMUSCULAR | Status: DC | PRN
  Administered 2020-03-11: 2 mg via INTRAVENOUS

## 2020-03-11 MED ORDER — LIDOCAINE 2% (20 MG/ML) 5 ML SYRINGE
INTRAMUSCULAR | Status: AC
  Filled 2020-03-11: qty 5

## 2020-03-11 MED ORDER — EPINEPHRINE PF 1 MG/ML IJ SOLN
INTRAMUSCULAR | Status: AC
  Filled 2020-03-11: qty 3

## 2020-03-11 MED ORDER — SUCCINYLCHOLINE CHLORIDE 200 MG/10ML IV SOSY
PREFILLED_SYRINGE | INTRAVENOUS | Status: AC
  Filled 2020-03-11: qty 10

## 2020-03-11 MED ORDER — FENTANYL CITRATE (PF) 100 MCG/2ML IJ SOLN
INTRAMUSCULAR | Status: DC | PRN
  Administered 2020-03-11 (×2): 50 ug via INTRAVENOUS

## 2020-03-11 MED ORDER — LACTATED RINGERS IV SOLN: INTRAVENOUS | Status: DC

## 2020-03-11 SURGICAL SUPPLY — 33 items
CANISTER SUCT 1200ML W/VALVE (MISCELLANEOUS) ×2 IMPLANT
CNTNR URN SCR LID CUP LEK RST (MISCELLANEOUS) IMPLANT
CONT SPEC 4OZ STRL OR WHT (MISCELLANEOUS)
COVER WAND RF STERILE (DRAPES) IMPLANT
GAUZE SPONGE 4X4 12PLY STRL LF (GAUZE/BANDAGES/DRESSINGS) ×4 IMPLANT
GLOVE BIO SURGEON STRL SZ 6.5 (GLOVE) ×2 IMPLANT
GLOVE SS BIOGEL STRL SZ 7.5 (GLOVE) ×1 IMPLANT
GLOVE SUPERSENSE BIOGEL SZ 7.5 (GLOVE) ×1
GOWN STRL REUS W/ TWL LRG LVL3 (GOWN DISPOSABLE) IMPLANT
GOWN STRL REUS W/ TWL XL LVL3 (GOWN DISPOSABLE) IMPLANT
GOWN STRL REUS W/TWL LRG LVL3 (GOWN DISPOSABLE)
GOWN STRL REUS W/TWL XL LVL3 (GOWN DISPOSABLE)
GUARD TEETH (MISCELLANEOUS) ×1 IMPLANT
MARKER SKIN DUAL TIP RULER LAB (MISCELLANEOUS) IMPLANT
NDL HYPO 18GX1.5 BLUNT FILL (NEEDLE) IMPLANT
NDL SAFETY ECLIPSE 18X1.5 (NEEDLE) ×1 IMPLANT
NDL SPNL 22GX7 QUINCKE BK (NEEDLE) IMPLANT
NEEDLE HYPO 18GX1.5 BLUNT FILL (NEEDLE) IMPLANT
NEEDLE HYPO 18GX1.5 SHARP (NEEDLE)
NEEDLE SPNL 22GX7 QUINCKE BK (NEEDLE) IMPLANT
NS IRRIG 1000ML POUR BTL (IV SOLUTION) ×2 IMPLANT
PACK BASIN DAY SURGERY FS (CUSTOM PROCEDURE TRAY) ×2 IMPLANT
PATTIES SURGICAL .5 X3 (DISPOSABLE) ×2 IMPLANT
SHEET MEDIUM DRAPE 40X70 STRL (DRAPES) ×2 IMPLANT
SLEEVE SCD COMPRESS KNEE MED (MISCELLANEOUS) ×2 IMPLANT
SOL ANTI FOG 6CC (MISCELLANEOUS) IMPLANT
SOLUTION ANTI FOG 6CC (MISCELLANEOUS)
SOLUTION BUTLER CLEAR DIP (MISCELLANEOUS) ×1 IMPLANT
SURGILUBE 2OZ TUBE FLIPTOP (MISCELLANEOUS) ×2 IMPLANT
SYR 5ML LL (SYRINGE) ×2 IMPLANT
SYR CONTROL 10ML LL (SYRINGE) IMPLANT
TOWEL GREEN STERILE FF (TOWEL DISPOSABLE) ×2 IMPLANT
TUBE CONNECTING 20X1/4 (TUBING) ×2 IMPLANT

## 2020-03-11 NOTE — Anesthesia Postprocedure Evaluation (Signed)
Anesthesia Post Note  Patient: ZYREN SEVIGNY  Procedure(s) Performed: DIRECT LARYNGOSCOPY (N/A Throat) INSERTION OF GUIDE WIRE FOLLOWED BY DILATION OVER GUIDE WIRE (N/A Throat)     Patient location during evaluation: PACU Anesthesia Type: General Level of consciousness: sedated Pain management: pain level controlled Vital Signs Assessment: post-procedure vital signs reviewed and stable Respiratory status: spontaneous breathing and respiratory function stable Cardiovascular status: stable Postop Assessment: no apparent nausea or vomiting Anesthetic complications: no   No complications documented.  Last Vitals:  Vitals:   03/11/20 0840 03/11/20 0856  BP:  (!) 139/91  Pulse: 91 93  Resp: (!) 21 (!) 22  Temp:  36.7 C  SpO2: 95% 95%    Last Pain:  Vitals:   03/11/20 0856  TempSrc:   PainSc: 0-No pain                 Daegen Berrocal DANIEL

## 2020-03-11 NOTE — Anesthesia Procedure Notes (Signed)
Procedure Name: Intubation Date/Time: 03/11/2020 7:40 AM Performed by: Genelle Bal, CRNA Pre-anesthesia Checklist: Patient identified, Emergency Drugs available, Suction available and Patient being monitored Patient Re-evaluated:Patient Re-evaluated prior to induction Oxygen Delivery Method: Circle system utilized Preoxygenation: Pre-oxygenation with 100% oxygen Induction Type: IV induction Ventilation: Mask ventilation without difficulty Laryngoscope Size: Miller and 2 Grade View: Grade I Tube type: Oral Tube size: 7.0 mm Number of attempts: 1 Airway Equipment and Method: Stylet and Oral airway Placement Confirmation: ETT inserted through vocal cords under direct vision,  positive ETCO2 and breath sounds checked- equal and bilateral Secured at: 22 cm Tube secured with: Tape Dental Injury: Teeth and Oropharynx as per pre-operative assessment

## 2020-03-11 NOTE — Discharge Instructions (Addendum)
Take your regular meds. Start with liquid and soft diet today and advance diet as tolerated. Contact our office if you have any fever or questions.   [phone] Call office for follow up appt in 2 weeks   Post Anesthesia Home Care Instructions  Activity: Get plenty of rest for the remainder of the day. A responsible individual must stay with you for 24 hours following the procedure.  For the next 24 hours, DO NOT: -Drive a car -Paediatric nurse -Drink alcoholic beverages -Take any medication unless instructed by your physician -Make any legal decisions or sign important papers.  Meals: Start with liquid foods such as gelatin or soup. Progress to regular foods as tolerated. Avoid greasy, spicy, heavy foods. If nausea and/or vomiting occur, drink only clear liquids until the nausea and/or vomiting subsides. Call your physician if vomiting continues.  Special Instructions/Symptoms: Your throat may feel dry or sore from the anesthesia or the breathing tube placed in your throat during surgery. If this causes discomfort, gargle with warm salt water. The discomfort should disappear within 24 hours.  If you had a scopolamine patch placed behind your ear for the management of post- operative nausea and/or vomiting:  1. The medication in the patch is effective for 72 hours, after which it should be removed.  Wrap patch in a tissue and discard in the trash. Wash hands thoroughly with soap and water. 2. You may remove the patch earlier than 72 hours if you experience unpleasant side effects which may include dry mouth, dizziness or visual disturbances. 3. Avoid touching the patch. Wash your hands with soap and water after contact with the patch.

## 2020-03-11 NOTE — Brief Op Note (Signed)
03/11/2020  8:12 AM  PATIENT:  Patrick Butler  59 y.o. male  PRE-OPERATIVE DIAGNOSIS:  esophageal stricture  POST-OPERATIVE DIAGNOSIS:  Same  PROCEDURE:  Procedure(s): DIRECT LARYNGOSCOPY (N/A) INSERTION OF GUIDE WIRE FOLLOWED BY DILATION OVER GUIDE WIRE (N/A)  #10 - #20  SURGEON:  Surgeon(s) and Role:    * Rozetta Nunnery, MD - Primary  PHYSICIAN ASSISTANT:   ASSISTANTS: none   ANESTHESIA:   general  EBL:  minimal   BLOOD ADMINISTERED:none  DRAINS: none   LOCAL MEDICATIONS USED:  NONE  SPECIMEN:  No Specimen  DISPOSITION OF SPECIMEN:  N/A  COUNTS:  YES  TOURNIQUET:  * No tourniquets in log *  DICTATION: .Other Dictation: Dictation Number [id number]  PLAN OF CARE: Discharge to home after PACU  PATIENT DISPOSITION:  PACU - hemodynamically stable.   Delay start of Pharmacological VTE agent (>24hrs) due to surgical blood loss or risk of bleeding: yes

## 2020-03-11 NOTE — Op Note (Signed)
NAME: Patrick Butler, Patrick Butler MEDICAL RECORD [id number] ACCOUNT [id number] DATE OF BIRTH:May 02, 1961 FACILITY: [facility] LOCATION: [facility] [place], MD  OPERATIVE REPORT  DATE OF PROCEDURE:  03/11/2020  PREOPERATIVE DIAGNOSES:  Esophageal stricture with dysphagia.  POSTOPERATIVE DIAGNOSES:  Esophageal stricture with dysphagia.  OPERATION PERFORMED:  Direct laryngoscopy.  Esophagoscopy with dilation of upper cervical esophageal stricture with the Savary dilators.  A 10 mm to 20 mm dilation.  SURGEON:  Melony Overly, MD.  ANESTHESIA:  General endotracheal.  COMPLICATIONS:  None.  BRIEF CLINICAL NOTE:  The patient is a 59 year old gentleman who has had previous history of tonsil cancer treated with chemoradiation therapy in 2005.  He subsequently developed esophageal stricture that required dilation initially in 2016.  He had  another dilation performed in 2018 and more recently has been complaining of difficulty swallowing large boluses of food.  He is taken to the operating room at this time for direct laryngoscopy and dilation of upper cervical esophagus.  DESCRIPTION OF PROCEDURE:  After adequate endotracheal anesthesia, direct laryngoscopy was performed.  The base of tongue was clear.  The epiglottis was edematous, but normal in appearance.  The vocal cords were clear bilaterally.  Both piriform sinuses  were clear.  The esophageal opening was identified and a Savary guidewire was passed.  Then, dilation was performed starting with a 10 mm Savary dilator.  This was passed over the wire and subsequent dilations were performed up to the #20 mm Savary  dilator.  Following dilations, a cervical esophagoscopy was performed.  On cervical esophagoscopy, patient had a small rim of mucosa just distal to the esophageal inlet, but the mucosa and cervical esophagus was clear distal to this.  This completed the  procedure.  The patient was subsequently awoken from anesthesia and  transferred to recovery room, postoperatively doing well.  DISPOSITION:  The patient is discharged home later this morning.  He will continue with his antacid medication, Protonix and advance diet as tolerated and will follow up in my office in 2 weeks for recheck.  HN/NUANCE  D:03/11/2020 T:03/11/2020 JOB:[id number]

## 2020-03-11 NOTE — Transfer of Care (Signed)
Immediate Anesthesia Transfer of Care Note  Patient: Patrick Butler  Procedure(s) Performed: DIRECT LARYNGOSCOPY (N/A Throat) INSERTION OF GUIDE WIRE FOLLOWED BY DILATION OVER GUIDE WIRE (N/A Throat)  Patient Location: PACU  Anesthesia Type:General  Level of Consciousness: awake, alert  and oriented  Airway & Oxygen Therapy: Patient Spontanous Breathing and Patient connected to face mask oxygen  Post-op Assessment: Report given to RN and Post -op Vital signs reviewed and stable  Post vital signs: Reviewed and stable  Last Vitals:  Vitals Value Taken Time  BP 156/79   Temp    Pulse 88 03/11/20 0822  Resp 12 03/11/20 0822  SpO2 97 % 03/11/20 0822  Vitals shown include unvalidated device data.  Last Pain:  Vitals:   03/11/20 0633  TempSrc: Oral  PainSc: 0-No pain      Patients Stated Pain Goal: 1 (47/65/46 5035)  Complications: No complications documented.

## 2020-03-11 NOTE — Interval H&P Note (Signed)
History and Physical Interval Note:  03/11/2020 7:32 AM  Patrick Butler  has presented today for surgery, with the diagnosis of esophageal obstruction.  The various methods of treatment have been discussed with the patient and family. After consideration of risks, benefits and other options for treatment, the patient has consented to  Procedure(s): DIRECT LARYNGOSCOPY (N/A) [place] (N/A) as a surgical intervention.  The patient's history has been reviewed, patient examined, no change in status, stable for surgery.  I have reviewed the patient's chart and labs.  Questions were answered to the patient's satisfaction.     Melony Overly

## 2020-03-14 ENCOUNTER — Encounter (HOSPITAL_BASED_OUTPATIENT_CLINIC_OR_DEPARTMENT_OTHER): Payer: Self-pay | Admitting: Otolaryngology

## 2020-03-16 ENCOUNTER — Other Ambulatory Visit: Payer: Self-pay | Admitting: Internal Medicine

## 2020-03-18 ENCOUNTER — Other Ambulatory Visit: Payer: Self-pay | Admitting: Internal Medicine

## 2020-03-23 ENCOUNTER — Other Ambulatory Visit: Payer: Self-pay | Admitting: Internal Medicine

## 2020-03-25 ENCOUNTER — Ambulatory Visit (INDEPENDENT_AMBULATORY_CARE_PROVIDER_SITE_OTHER): Payer: BC Managed Care – PPO | Admitting: Otolaryngology

## 2020-03-25 ENCOUNTER — Other Ambulatory Visit: Payer: Self-pay

## 2020-03-25 VITALS — Temp 97.0°F

## 2020-03-25 DIAGNOSIS — Z4889 Encounter for other specified surgical aftercare: Secondary | ICD-10-CM

## 2020-03-25 NOTE — Progress Notes (Signed)
HPI: Patrick Butler is a 59 y.o. male who presents 2 weeks days s/p dilation of esophageal stricture.  He is doing well and swallowing better but still feels like he has a ledge or sometimes things get caught in his throat.  He was dilated to 20 mm..   Past Medical History:  Diagnosis Date  . Anxiety   . Arthritis    right knee  . Cancer ([place]) 2005   tonsils/Chemo and radiation  . Dysphagia 08/2014  . Dyspnea    with exertion  . Full dentures   . GERD (gastroesophageal reflux disease)   . History of chemotherapy   . History of radiation therapy    neck  . Hyperlipidemia   . Hypertension    states under control with med., has been on med. ~ 5 yr.  . Hypothyroidism   . Non-insulin dependent type 2 diabetes mellitus ([place])   . Posterior neck pain   . Seasonal allergies   . Sleep apnea    uses CPAP nightly   Past Surgical History:  Procedure Laterality Date  . COLONOSCOPY  2012  . COLONOSCOPY WITH PROPOFOL N/A 10/04/2015   Procedure: COLONOSCOPY WITH PROPOFOL;  Surgeon: Doran Stabler, MD;  Location: [facility];  Service: Gastroenterology;  Laterality: N/A;  . DIRECT LARYNGOSCOPY N/A 08/20/2014   Procedure: DIRECT LARYNGOSCOPY;  Surgeon: Rozetta Nunnery, MD;  Location: [place];  Service: ENT;  Laterality: N/A;  . DIRECT LARYNGOSCOPY N/A 08/21/2016   Procedure: DIRECT LARYNGOSCOPY;  Surgeon: Rozetta Nunnery, MD;  Location: [place];  Service: ENT;  Laterality: N/A;  . DIRECT LARYNGOSCOPY N/A 03/11/2020   Procedure: DIRECT LARYNGOSCOPY;  Surgeon: Rozetta Nunnery, MD;  Location: [place];  Service: ENT;  Laterality: N/A;  . ESOPHAGEAL DILATION N/A 03/11/2020   Procedure: INSERTION OF GUIDE WIRE FOLLOWED BY DILATION OVER GUIDE WIRE;  Surgeon: Rozetta Nunnery, MD;  Location: [place];  Service: ENT;  Laterality: N/A;  . ESOPHAGOSCOPY WITH DILITATION N/A 08/20/2014   Procedure: ESOPHAGOSCOPY WITH DILATATION;  Surgeon:  Rozetta Nunnery, MD;  Location: [place];  Service: ENT;  Laterality: N/A;  . ESOPHAGOSCOPY WITH DILITATION N/A 08/21/2016   Procedure: ESOPHAGOSCOPY WITH DILITATION;  Surgeon: Rozetta Nunnery, MD;  Location: [place];  Service: ENT;  Laterality: N/A;  . KNEE ARTHROPLASTY Right   . KNEE ARTHROSCOPY Right    x 3  . KNEE ARTHROSCOPY Left 10/28/2015   Procedure: ARTHROSCOPY KNEE;  Surgeon: Dorna Leitz, MD;  Location: [place];  Service: Orthopedics;  Laterality: Left;  left knee partial medial meniscectomy and patellofemoral chondroplasty  . LAPAROSCOPIC GASTRIC BYPASS  2004  . LUMBAR LAMINECTOMY/DECOMPRESSION MICRODISCECTOMY  05/24/2011   Procedure: LUMBAR LAMINECTOMY/DECOMPRESSION MICRODISCECTOMY;  Surgeon: Winfield Cunas, MD;  Location: [facility];  Service: Neurosurgery;  Laterality: Right;  RIGHT L5-S1 Laminectomy, discectomy  . LYMPHADENECTOMY Right    cervical  . MULTIPLE TOOTH EXTRACTIONS    . PORT-A-CATH REMOVAL    . PORTACATH PLACEMENT    . REPLACEMENT TOTAL KNEE Right 03/22/2004  . SHOULDER ARTHROSCOPY Left   . SHOULDER OPEN ROTATOR CUFF REPAIR Right    Social History   Socioeconomic History  . Marital status: Married    Spouse name: [name]  . Number of children: Not on file  . Years of education: Not on file  . Highest education level: Not on file  Occupational History  . Occupation: Heritage manager  Employer: [employer]  Tobacco Use  . Smoking status: Former Smoker    Packs/day: 0.00    Years: 25.00    Pack years: 0.00    Quit date: 10/25/2002    Years since quitting: 17.4  . Smokeless tobacco: Never Used  Vaping Use  . Vaping Use: Never used  Substance and Sexual Activity  . Alcohol use: Not Currently    Alcohol/week: 3.0 - 4.0 standard drinks    Types: 3 - 4 Standard drinks or equivalent per week    Comment: 08/17/16- " no desire in past 3- 4 months  . Drug use: No  . Sexual activity: Yes  Other Topics Concern  . Not on file   Social History Narrative  . Not on file   Social Determinants of Health   Financial Resource Strain:   . Difficulty of Paying Living Expenses: Not on file  Food Insecurity:   . Worried About Charity fundraiser in the Last Year: Not on file  . Ran Out of Food in the Last Year: Not on file  Transportation Needs:   . Lack of Transportation (Medical): Not on file  . Lack of Transportation (Non-Medical): Not on file  Physical Activity:   . Days of Exercise per Week: Not on file  . Minutes of Exercise per Session: Not on file  Stress:   . Feeling of Stress : Not on file  Social Connections:   . Frequency of Communication with Friends and Family: Not on file  . Frequency of Social Gatherings with Friends and Family: Not on file  . Attends Religious Services: Not on file  . Active Member of Clubs or Organizations: Not on file  . Attends Archivist Meetings: Not on file  . Marital Status: Not on file   Family History  Problem Relation Age of Onset  . Colon cancer Paternal Uncle   . Heart attack Maternal Uncle         X 2; both pre 55  . Hypertension Mother   . Breast cancer Mother   . Coronary artery disease Mother   . Brain cancer Paternal Uncle   . Diabetes Maternal Grandfather   . Hypertension Maternal Grandfather        ? metastatic; ? primary  . Diabetes Father   . Stroke Father 77  . Dementia Father    Allergies  Allergen Reactions  . Sulfa Antibiotics Rash   Prior to Admission medications   Medication Sig Start Date End Date Taking? Authorizing Provider  amLODipine-benazepril (LOTREL) 10-40 MG capsule TAKE 1 CAPSULE BY MOUTH DAILY 10/13/19  Yes Burns, Claudina Lick, MD  aspirin EC 81 MG tablet Take 81 mg by mouth every evening.    Yes [provider]  cetirizine (ZYRTEC) 10 MG tablet Take 10 mg by mouth daily.   Yes [provider]  fluticasone (FLONASE) 50 MCG/ACT nasal spray Place 2 sprays into the nose as directed. At bedtime Patient taking  differently: Place 2 sprays into the nose at bedtime.  11/20/11  Yes Hendricks Limes, MD  ketoconazole (NIZORAL) 2 % shampoo Apply 1 application topically 2 (two) times a week. 07/04/18  Yes [provider]  levothyroxine (SYNTHROID) 200 MCG tablet TAKE 1 TABLET BY MOUTH DAILY BEFORE BREAKFAST 01/29/20  Yes Burns, Claudina Lick, MD  LORazepam (ATIVAN) 0.5 MG tablet TAKE 1 TABLET BY MOUTH DAILY AS NEEDED FOR ANXIETY 09/04/19  Yes Burns, Claudina Lick, MD  metFORMIN (GLUCOPHAGE) 500 MG tablet  TAKE 1 TABLET BY MOUTH TWICE DAILY WITH MEALS 03/21/20  Yes Burns, Claudina Lick, MD  metoprolol succinate (TOPROL XL) 100 MG 24 hr tablet Take 1 tablet (100 mg total) by mouth daily. Take with or immediately following a meal. 10/20/19  Yes Burns, Claudina Lick, MD  metoprolol succinate (TOPROL-XL) 50 MG 24 hr tablet Take 1 tablet (50 mg total) by mouth daily. Take with or immediately following a meal.  Total of 150 mg daily 01/25/20  Yes Burns, Claudina Lick, MD  Multiple Vitamins-Minerals (MULTIVITAMIN WITH MINERALS) tablet Take 1 tablet by mouth daily.    Yes [provider]  OZEMPIC, 1 MG/DOSE, 2 MG/1.5ML SOPN INJECT 1 MG INTO THE SKIN ONCE A WEEK 02/24/20  Yes Burns, Claudina Lick, MD  pantoprazole (PROTONIX) 40 MG tablet TAKE 1 TABLET BY MOUTH DAILY 03/23/20  Yes Burns, Claudina Lick, MD  pravastatin (PRAVACHOL) 40 MG tablet TAKE 1 TABLET BY MOUTH DAILY 02/12/20  Yes Burns, Claudina Lick, MD  tolterodine (DETROL LA) 4 MG 24 hr capsule Take 4 mg by mouth every evening.    Yes [provider]     Physical Exam: Doing well with no signs of infection.  No neck swelling.   Assessment: S/p reviewed with him that he was dilated to the maximum size of 20 mm.  He had only mild stricture as I started with a 12 mm dilation.  Plan: Recommend continuation with his antiacid medication.  Reviewed with him that some of his dysphagia is related to the dry mucous membranes from previous radiation therapy. He will follow-up as  needed.   Radene Journey, MD

## 2020-04-11 ENCOUNTER — Other Ambulatory Visit: Payer: Self-pay | Admitting: Internal Medicine

## 2020-05-18 ENCOUNTER — Other Ambulatory Visit: Payer: Self-pay | Admitting: Internal Medicine

## 2020-05-24 ENCOUNTER — Encounter: Payer: Self-pay | Admitting: Primary Care

## 2020-05-24 ENCOUNTER — Ambulatory Visit (INDEPENDENT_AMBULATORY_CARE_PROVIDER_SITE_OTHER): Payer: BC Managed Care – PPO | Admitting: Primary Care

## 2020-05-24 ENCOUNTER — Other Ambulatory Visit: Payer: Self-pay

## 2020-05-24 DIAGNOSIS — G4733 Obstructive sleep apnea (adult) (pediatric): Secondary | ICD-10-CM | POA: Diagnosis not present

## 2020-05-24 NOTE — Progress Notes (Signed)
@Patient  ID: Patrick Butler, male    DOB: 1960/08/08, 60 y.o.   MRN: [id number]  Chief Complaint  Patient presents with  . Follow-up    ROV for cpap supplies- Wears cpap 7-8 hrs/night - Mask doing good    Referring provider: Binnie Rail, MD  HPI: 60 year old male, former smoker quit in 2004 (25-pack-year history).  Past medical history significant for hypertension, obstructive sleep apnea, GERD, hypothyroidism, type 2 diabetes, hyperlipidemia, tonsil cancer, obesity.  Patient of Dr. Elsworth Soho, last seen in office on 06/03/2018. NPSG 08/2011: AHI 21/hr, nasal cpap titrated to 16cm.   05/24/2020 Presents today for 2 year follow-up OSA.  He needs renewal of CPAP supplies. He has no complaints, complaint with therapy. He can not sleep without his wearing CPAP. He wears nasal pillow mask. He gets 8 hours of sleep a night. He is working on weight loss, he is down 10lbs since last office visit with [place]. He underwent dilation of esophageal stricture in November 2021 with Otolaryngology. Still occasionally has trouble swallowing. Denies significant daytie fatigue or daytime sleepiness. DME company is [facility].  [facility] download 04/24/2020 - 05/23/2020 Usage days 28/30 (93%); 28 days (93%) greater than 4 hours Pressure 10-15 cm H2O (10.6 cm H2O-95%) Air leaks 36.3 L/min (95%) AHI 0.6  Allergies  Allergen Reactions  . Sulfa Antibiotics Rash    Immunization History  Administered Date(s) Administered  . Influenza Split 05/25/2011  . Influenza Whole 04/26/2009  . Influenza, Seasonal, Injecte, Preservative Fre 06/01/2014  . Influenza,inj,Quad PF,6+ Mos 04/06/2013, 01/21/2015, 01/16/2017, 01/21/2018, 01/23/2019, 01/25/2020  . Influenza-Unspecified 01/09/2016  . PFIZER(Purple Top)SARS-COV-2 Vaccination 08/05/2019, 08/26/2019, 02/29/2020  . Pneumococcal Polysaccharide-23 07/12/2016  . Td 07/22/2008  . Tdap 07/23/2018    Past Medical History:  Diagnosis Date  . Anxiety   . Arthritis     right knee  . Cancer ([place]) 2005   tonsils/Chemo and radiation  . Dysphagia 08/2014  . Dyspnea    with exertion  . Full dentures   . GERD (gastroesophageal reflux disease)   . History of chemotherapy   . History of radiation therapy    neck  . Hyperlipidemia   . Hypertension    states under control with med., has been on med. ~ 5 yr.  . Hypothyroidism   . Non-insulin dependent type 2 diabetes mellitus ([place])   . Posterior neck pain   . Seasonal allergies   . Sleep apnea    uses CPAP nightly    Tobacco History: Social History   Tobacco Use  Smoking Status Former Smoker  . Packs/day: 0.00  . Years: 25.00  . Pack years: 0.00  . Quit date: 10/25/2002  . Years since quitting: 17.5  Smokeless Tobacco Never Used   Counseling given: Not Answered   Outpatient Medications Prior to Visit  Medication Sig Dispense Refill  . amLODipine-benazepril (LOTREL) 10-40 MG capsule TAKE 1 CAPSULE BY MOUTH DAILY 90 capsule 1  . aspirin EC 81 MG tablet Take 81 mg by mouth every evening.     . cetirizine (ZYRTEC) 10 MG tablet Take 10 mg by mouth daily.    . fluticasone (FLONASE) 50 MCG/ACT nasal spray Place 2 sprays into the nose as directed. At bedtime (Patient taking differently: Place 2 sprays into the nose at bedtime.) 16 g 5  . ketoconazole (NIZORAL) 2 % shampoo Apply 1 application topically 2 (two) times a week.    . levothyroxine (SYNTHROID) 200 MCG tablet TAKE 1 TABLET BY MOUTH DAILY BEFORE BREAKFAST  90 tablet 1  . LORazepam (ATIVAN) 0.5 MG tablet TAKE 1 TABLET BY MOUTH DAILY AS NEEDED FOR ANXIETY 30 tablet 0  . metFORMIN (GLUCOPHAGE) 500 MG tablet TAKE 1 TABLET BY MOUTH TWICE DAILY WITH MEALS 180 tablet 1  . metoprolol succinate (TOPROL-XL) 100 MG 24 hr tablet TAKE 1 TABLET BY MOUTH DAILY WITH A MEAL 90 tablet 1  . metoprolol succinate (TOPROL-XL) 50 MG 24 hr tablet Take 1 tablet (50 mg total) by mouth daily. Take with or immediately following a meal.  Total of 150 mg daily 90 tablet 3  .  Multiple Vitamins-Minerals (MULTIVITAMIN WITH MINERALS) tablet Take 1 tablet by mouth daily.     Marland Kitchen OZEMPIC, 1 MG/DOSE, 4 MG/3ML SOPN INJECT 1 MG INTO THE SKIN ONCE A WEEK 3 mL 2  . pantoprazole (PROTONIX) 40 MG tablet TAKE 1 TABLET BY MOUTH DAILY 90 tablet 0  . pravastatin (PRAVACHOL) 40 MG tablet TAKE 1 TABLET BY MOUTH DAILY 90 tablet 1  . tolterodine (DETROL LA) 4 MG 24 hr capsule Take 4 mg by mouth every evening.     Marland Kitchen OZEMPIC, 1 MG/DOSE, 2 MG/1.5ML SOPN INJECT 1 MG INTO THE SKIN ONCE A WEEK 3 mL 2   No facility-administered medications prior to visit.   Review of Systems  Review of Systems  Constitutional: Negative.   Respiratory: Negative.   Cardiovascular: Negative.    Physical Exam  BP 138/82   Pulse 100   Temp 97.9 F (36.6 C) (Oral)   Ht 6' (1.829 m)   Wt (!) 334 lb 6.4 oz (151.7 kg)   SpO2 97%   BMI 45.35 kg/m  Physical Exam Constitutional:      General: He is not in acute distress.    Appearance: Normal appearance. He is obese. He is not ill-appearing.  HENT:     Head: Normocephalic and atraumatic.     Mouth/Throat:     Mouth: Mucous membranes are moist.     Pharynx: Oropharynx is clear.  Neck:     Comments: Hx radiation Cardiovascular:     Rate and Rhythm: Normal rate and regular rhythm.  Pulmonary:     Effort: Pulmonary effort is normal.     Breath sounds: Normal breath sounds.     Comments: CTA Musculoskeletal:        General: Normal range of motion.  Neurological:     General: No focal deficit present.     Mental Status: He is alert and oriented to person, place, and time. Mental status is at baseline.  Psychiatric:        Mood and Affect: Mood normal.        Behavior: Behavior normal.        Thought Content: Thought content normal.        Judgment: Judgment normal.      Lab Results:   CBC    Component Value Date/Time   WBC 8.6 07/24/2019 0943   RBC 5.09 07/24/2019 0943   HGB 14.6 07/24/2019 0943   HGB 15.3 10/14/2008 0805   HGB 15.1  10/16/2007 0813   HCT 44.4 07/24/2019 0943   HCT 46.2 10/14/2008 0805   HCT 44.5 10/16/2007 0813   PLT 184.0 07/24/2019 0943   PLT 172 10/14/2008 0805   PLT 181 10/16/2007 0813   MCV 87.3 07/24/2019 0943   MCV 89 10/14/2008 0805   MCV 88.5 10/16/2007 0813   MCH 27.6 07/13/2018 0905   MCHC 32.8 07/24/2019 0943   RDW 15.0 07/24/2019  0943   RDW 12.8 10/14/2008 0805   RDW 14.2 10/16/2007 0813   LYMPHSABS 4.3 (H) 07/24/2019 0943   LYMPHSABS 2.7 10/14/2008 0805   LYMPHSABS 2.6 10/16/2007 0813   MONOABS 0.7 07/24/2019 0943   MONOABS 0.6 10/16/2007 0813   EOSABS 0.1 07/24/2019 0943   EOSABS 0.1 10/14/2008 0805   BASOSABS 0.0 07/24/2019 0943   BASOSABS 0.0 10/14/2008 0805   BASOSABS 0.0 10/16/2007 0813    BMET    Component Value Date/Time   NA 139 03/08/2020 0900   K 5.3 (H) 03/08/2020 0900   CL 104 03/08/2020 0900   CO2 26 03/08/2020 0900   GLUCOSE 114 (H) 03/08/2020 0900   BUN 18 03/08/2020 0900   CREATININE 1.31 (H) 03/08/2020 0900   CREATININE 1.06 01/25/2020 0946   CALCIUM 9.5 03/08/2020 0900   GFRNONAA >60 03/08/2020 0900   GFRNONAA 75 01/25/2020 0946   GFRAA 87 01/25/2020 0946    BNP No results found for: BNP  ProBNP No results found for: PROBNP  Imaging: No results found.   Assessment & Plan:   OSA (obstructive sleep apnea) - Patient is 93% compliant with CPAP use - Pressure 10-15cm h20; residual AHI 0.6 - Renew CPAP supplies with [facility] - Advised patient to not drive if experiencing excessive daytime fatigue - Continue to work on weight loss - No changes today  - Follow-up in 2 years with Dr. Candise Che, NP 05/24/2020

## 2020-05-24 NOTE — Assessment & Plan Note (Signed)
-   Patient is 93% compliant with CPAP use - Pressure 10-15cm h20; residual AHI 0.6 - Renew CPAP supplies with [facility] - Advised patient to not drive if experiencing excessive daytime fatigue - Continue to work on weight loss - No changes today  - Follow-up in 2 years with Dr. Elsworth Soho

## 2020-05-24 NOTE — Assessment & Plan Note (Deleted)
-   He is working on weight loss and lost >30 lbs in the last year

## 2020-05-24 NOTE — Patient Instructions (Signed)
Pleasure meeting you today [name] work on weight loss Continue to wear CPAP every night for 4-6 hours or more Do not drive if excessively tired  Orders: Renew CPAP supplies with [facility]   Follow-up:  2 year follow-up    CPAP and BPAP Information CPAP and BPAP are methods that use air pressure to keep your airways open and to help you breathe well. CPAP and BPAP use different amounts of pressure. Your health care provider will tell you whether CPAP or BPAP would be more helpful for you.  CPAP stands for "continuous positive airway pressure." With CPAP, the amount of pressure stays the same while you breathe in and out.  BPAP stands for "bi-level positive airway pressure." With BPAP, the amount of pressure will be higher when you breathe in (inhale) and lower when you breathe out(exhale). This allows you to take larger breaths. CPAP or BPAP may be used in the hospital, or your health care provider may want you to use it at home. You may need to have a sleep study before your health care provider can order a machine for you to use at home. Why are CPAP and BPAP treatments used? CPAP or BPAP can be helpful if you have:  Sleep apnea.  Chronic obstructive pulmonary disease (COPD).  Heart failure.  Medical conditions that cause muscle weakness, including muscular dystrophy or amyotrophic lateral sclerosis (ALS).  Other problems that cause breathing to be shallow, weak, abnormal, or difficult. CPAP and BPAP are most commonly used for obstructive sleep apnea (OSA) to keep the airways from collapsing when the muscles relax during sleep. How is CPAP or BPAP administered? Both CPAP and BPAP are provided by a small machine with a flexible plastic tube that attaches to a plastic mask that you wear. Air is blown through the mask into your nose or mouth. The amount of pressure that is used to blow the air can be adjusted on the machine. Your health care provider will set the  pressure setting and help you find the best mask for you. When should CPAP or BPAP be used? In most cases, the mask only needs to be worn during sleep. Generally, the mask needs to be worn throughout the night and during any daytime naps. People with certain medical conditions may also need to wear the mask at other times when they are awake. Follow instructions from your health care provider about when to use the machine. What are some tips for using the mask?  Because the mask needs to be snug, some people feel trapped or closed-in (claustrophobic) when first using the mask. If you feel this way, you may need to get used to the mask. One way to do this is to hold the mask loosely over your nose or mouth and then gradually apply the mask more snugly. You can also gradually increase the amount of time that you use the mask.  Masks are available in various types and sizes. If your mask does not fit well, talk with your health care provider about getting a different one. Some common types of masks include: ? Full face masks, which fit over the mouth and nose. ? Nasal masks, which fit over the nose. ? Nasal pillow or prong masks, which fit into the nostrils.  If you are using a mask that fits over your nose and you tend to breathe through your mouth, a chin strap may be applied to help keep your mouth closed.  Some CPAP  and BPAP machines have alarms that may sound if the mask comes off or develops a leak.  If you have trouble with the mask, it is very important that you talk with your health care provider about finding a way to make the mask easier to tolerate. Do not stop using the mask. There could be a negative impact to your health if you stop using the mask.   What are some tips for using the machine?  Place your CPAP or BPAP machine on a secure table or stand near an electrical outlet.  Know where the on/off switch is on the machine.  Follow instructions from your health care provider about  how to set the pressure on your machine and when you should use it.  Do not eat or drink while the CPAP or BPAP machine is on. Food or fluids could get pushed into your lungs by the pressure of the CPAP or BPAP.  For home use, CPAP and BPAP machines can be rented or purchased through home health care companies. Many different brands of machines are available. Renting a machine before purchasing may help you find out which particular machine works well for you. Your insurance may also decide which machine you may get.  Keep the CPAP or BPAP machine and attachments clean. Ask your health care provider for specific instructions. Follow these instructions at home:  Do not use any products that contain nicotine or tobacco, such as cigarettes, e-cigarettes, and chewing tobacco. If you need help quitting, ask your health care provider.  Keep all follow-up visits as told by your health care provider. This is important. Contact a health care provider if:  You have redness or pressure sores on your head, face, mouth, or nose from the mask or head gear.  You have trouble using the CPAP or BPAP machine.  You cannot tolerate wearing the CPAP or BPAP mask.  Someone tells you that you snore even when wearing your CPAP or BPAP. Get help right away if:  You have trouble breathing.  You feel confused. Summary  CPAP and BPAP are methods that use air pressure to keep your airways open and to help you breathe well.  You may need to have a sleep study before your health care provider can order a machine for home use.  If you have trouble with the mask, it is very important that you talk with your health care provider about finding a way to make the mask easier to tolerate. Do not stop using the mask. There could be a negative impact to your health if you stop using the mask.  Follow instructions from your health care provider about when to use the machine. This information is not intended to replace  advice given to you by your health care provider. Make sure you discuss any questions you have with your health care provider. Document Revised: 05/15/2019 Document Reviewed: 05/18/2019 Elsevier Patient Education  2021 [employer].

## 2020-05-30 ENCOUNTER — Telehealth: Payer: Self-pay | Admitting: Primary Care

## 2020-05-31 NOTE — Telephone Encounter (Signed)
Called [facility] back and spoke with Angie she said she is looking for a CMN to be faxed back to her.   Spoke with Chantel [facility] and she said she just received it back and will fax it today.   Angie states when she receives the fax she will get the supplies to the patient.   Nothing further needed at this time.

## 2020-06-02 DIAGNOSIS — G4733 Obstructive sleep apnea (adult) (pediatric): Secondary | ICD-10-CM | POA: Diagnosis not present

## 2020-06-03 DIAGNOSIS — N5201 Erectile dysfunction due to arterial insufficiency: Secondary | ICD-10-CM | POA: Diagnosis not present

## 2020-06-03 DIAGNOSIS — N3281 Overactive bladder: Secondary | ICD-10-CM | POA: Diagnosis not present

## 2020-06-03 DIAGNOSIS — Z125 Encounter for screening for malignant neoplasm of prostate: Secondary | ICD-10-CM | POA: Diagnosis not present

## 2020-06-06 NOTE — Progress Notes (Signed)
Subjective:    Patient ID: Patrick Butler, male    DOB: Dec 21, 1960, 60 y.o.   MRN: [id number]  HPI The patient is here for an acute visit.   Back, neck and hip pain -  He has cervical spine pain.  He has had injection s in the past.  The pain starts in his lower C spine and radiates up to his head.  This is chronic pain.   His hip pain/back pain has gotten worse.  This pain started months and months ago, but has gotten worse over the past few months.  The pain is located in his posterior left hip.  He has pain radiating down the left leg to his toes.   The leg is weak.  He has some N/T in the left leg.  When he stands for long periods of time he can feel pain in his bilateral groin region.   In 2013 he had a disc prolapse  - he had surgery. This was on the right side.    The pain is gotten bad enough that he needed to come in.  He has not taken anything for the pain and prefers not to add any more medication.   He is unsure if the pain is from his hip or his back.  He just knows that getting harder to go upstairs and walk around and he needs to get this fixed.  Medications and allergies reviewed with patient and updated if appropriate.  Patient Active Problem List   Diagnosis Date Noted  . Leukocytosis 07/23/2019  . Colitis 07/23/2018  . Encounter for prostate cancer screening 01/21/2018  . Anxiety 07/12/2016  . Rash and nonspecific skin eruption 01/13/2016  . Urinary frequency 01/13/2016  . Acute medial meniscus tear of left knee 10/28/2015  . History of colonic polyps   . History of cancer tonsil 07/13/2015  . Erectile dysfunction 07/13/2015  . Type 2 diabetes mellitus ([place]) 01/12/2014  . Severe obesity (BMI >= 40) ([place]) 10/08/2013  . Nonspecific abnormal electrocardiogram (ECG) (EKG) 09/22/2012  . OSA (obstructive sleep apnea) 08/08/2011  . Lumbar disc prolapse with compression radiculopathy 05/24/2011  . GERD 01/18/2009  . HYPERPLASIA PROSTATE UNS W/O UR OBST & OTH  LUTS 01/18/2009  . Hypothyroidism 03/23/2008  . Hyperlipidemia 03/23/2008  . Essential hypertension 03/23/2008    Current Outpatient Medications on File Prior to Visit  Medication Sig Dispense Refill  . amLODipine-benazepril (LOTREL) 10-40 MG capsule TAKE 1 CAPSULE BY MOUTH DAILY 90 capsule 1  . aspirin EC 81 MG tablet Take 81 mg by mouth every evening.     . cetirizine (ZYRTEC) 10 MG tablet Take 10 mg by mouth daily.    . fluticasone (FLONASE) 50 MCG/ACT nasal spray Place 2 sprays into the nose as directed. At bedtime (Patient taking differently: Place 2 sprays into the nose at bedtime.) 16 g 5  . ketoconazole (NIZORAL) 2 % shampoo Apply 1 application topically 2 (two) times a week.    . levothyroxine (SYNTHROID) 200 MCG tablet TAKE 1 TABLET BY MOUTH DAILY BEFORE BREAKFAST 90 tablet 1  . LORazepam (ATIVAN) 0.5 MG tablet TAKE 1 TABLET BY MOUTH DAILY AS NEEDED FOR ANXIETY 30 tablet 0  . metFORMIN (GLUCOPHAGE) 500 MG tablet TAKE 1 TABLET BY MOUTH TWICE DAILY WITH MEALS 180 tablet 1  . metoprolol succinate (TOPROL-XL) 100 MG 24 hr tablet TAKE 1 TABLET BY MOUTH DAILY WITH A MEAL 90 tablet 1  . metoprolol succinate (TOPROL-XL) 50 MG 24 hr  tablet Take 1 tablet (50 mg total) by mouth daily. Take with or immediately following a meal.  Total of 150 mg daily 90 tablet 3  . Multiple Vitamins-Minerals (MULTIVITAMIN WITH MINERALS) tablet Take 1 tablet by mouth daily.     Marland Kitchen OZEMPIC, 1 MG/DOSE, 4 MG/3ML SOPN INJECT 1 MG INTO THE SKIN ONCE A WEEK 3 mL 2  . pantoprazole (PROTONIX) 40 MG tablet TAKE 1 TABLET BY MOUTH DAILY 90 tablet 0  . pravastatin (PRAVACHOL) 40 MG tablet TAKE 1 TABLET BY MOUTH DAILY 90 tablet 1  . tadalafil (CIALIS) 5 MG tablet Take 5 mg by mouth daily.    Marland Kitchen tolterodine (DETROL LA) 4 MG 24 hr capsule Take 4 mg by mouth every evening.      No current facility-administered medications on file prior to visit.    Past Medical History:  Diagnosis Date  . Anxiety   . Arthritis    right  knee  . Cancer ([place]) 2005   tonsils/Chemo and radiation  . Dysphagia 08/2014  . Dyspnea    with exertion  . Full dentures   . GERD (gastroesophageal reflux disease)   . History of chemotherapy   . History of radiation therapy    neck  . Hyperlipidemia   . Hypertension    states under control with med., has been on med. ~ 5 yr.  . Hypothyroidism   . Non-insulin dependent type 2 diabetes mellitus ([place])   . Posterior neck pain   . Seasonal allergies   . Sleep apnea    uses CPAP nightly    Past Surgical History:  Procedure Laterality Date  . COLONOSCOPY  2012  . COLONOSCOPY WITH PROPOFOL N/A 10/04/2015   Procedure: COLONOSCOPY WITH PROPOFOL;  Surgeon: Doran Stabler, MD;  [facility]: [facility];  Service: Gastroenterology;  Laterality: N/A;  . DIRECT LARYNGOSCOPY N/A 08/20/2014   Procedure: DIRECT LARYNGOSCOPY;  Surgeon: Rozetta Nunnery, MD;  Location: [facility];  Service: ENT;  Laterality: N/A;  . DIRECT LARYNGOSCOPY N/A 08/21/2016   Procedure: DIRECT LARYNGOSCOPY;  Surgeon: Rozetta Nunnery, MD;  Location: [facility];  Service: ENT;  Laterality: N/A;  . DIRECT LARYNGOSCOPY N/A 03/11/2020   Procedure: DIRECT LARYNGOSCOPY;  Surgeon: Rozetta Nunnery, MD;  Location: [facility];  Service: ENT;  Laterality: N/A;  . ESOPHAGEAL DILATION N/A 03/11/2020   Procedure: INSERTION OF GUIDE WIRE FOLLOWED BY DILATION OVER GUIDE WIRE;  Surgeon: Rozetta Nunnery, MD;  Location: [facility];  Service: ENT;  Laterality: N/A;  . ESOPHAGOSCOPY WITH DILITATION N/A 08/20/2014   Procedure: ESOPHAGOSCOPY WITH DILATATION;  Surgeon: Rozetta Nunnery, MD;  Location: [facility];  Service: ENT;  Laterality: N/A;  . ESOPHAGOSCOPY WITH DILITATION N/A 08/21/2016   Procedure: ESOPHAGOSCOPY WITH DILITATION;  Surgeon: Rozetta Nunnery, MD;  Location: [facility];  Service: ENT;  Laterality: N/A;  . KNEE ARTHROPLASTY Right   . KNEE ARTHROSCOPY  Right    x 3  . KNEE ARTHROSCOPY Left 10/28/2015   Procedure: ARTHROSCOPY KNEE;  Surgeon: Dorna Leitz, MD;  Location: [facility];  Service: Orthopedics;  Laterality: Left;  left knee partial medial meniscectomy and patellofemoral chondroplasty  . LAPAROSCOPIC GASTRIC BYPASS  2004  . LUMBAR LAMINECTOMY/DECOMPRESSION MICRODISCECTOMY  05/24/2011   Procedure: LUMBAR LAMINECTOMY/DECOMPRESSION MICRODISCECTOMY;  Surgeon: Winfield Cunas, MD;  Location: [facility];  Service: Neurosurgery;  Laterality: Right;  RIGHT L5-S1 Laminectomy, discectomy  . LYMPHADENECTOMY Right    cervical  .  MULTIPLE TOOTH EXTRACTIONS    . PORT-A-CATH REMOVAL    . PORTACATH PLACEMENT    . REPLACEMENT TOTAL KNEE Right 03/22/2004  . SHOULDER ARTHROSCOPY Left   . SHOULDER OPEN ROTATOR CUFF REPAIR Right     Social History   Socioeconomic History  . Marital status: Married    Spouse name: [name]  . Number of children: Not on file  . Years of education: Not on file  . Highest education level: Not on file  Occupational History  . Occupation: Information systems manager: [employer]  Tobacco Use  . Smoking status: Former Smoker    Packs/day: 0.00    Years: 25.00    Pack years: 0.00    Quit date: 10/25/2002    Years since quitting: 17.6  . Smokeless tobacco: Never Used  Vaping Use  . Vaping Use: Never used  Substance and Sexual Activity  . Alcohol use: Not Currently    Alcohol/week: 3.0 - 4.0 standard drinks    Types: 3 - 4 Standard drinks or equivalent per week    Comment: 08/17/16- " no desire in past 3- 4 months  . Drug use: No  . Sexual activity: Yes  Other Topics Concern  . Not on file  Social History Narrative  . Not on file   Social Determinants of Health   Financial Resource Strain: Not on file  Food Insecurity: Not on file  Transportation Needs: Not on file  Physical Activity: Not on file  Stress: Not on file  Social Connections: Not on file    Family History  Problem Relation Age of Onset   . Colon cancer Paternal Uncle   . Heart attack Maternal Uncle         X 2; both pre 55  . Hypertension Mother   . Breast cancer Mother   . Coronary artery disease Mother   . Brain cancer Paternal Uncle   . Diabetes Maternal Grandfather   . Hypertension Maternal Grandfather        ? metastatic; ? primary  . Diabetes Father   . Stroke Father 34  . Dementia Father     Review of Systems  Constitutional: Negative for chills and fever.  Gastrointestinal:       No change in bowels with back / leg pain  Genitourinary:       No change in urination with back / leg pain  Musculoskeletal: Positive for back pain (to left leg).       Pain in groin area with standing long periods  Neurological: Positive for weakness (left leg) and numbness.       Objective:   Vitals:   06/07/20 0914  BP: (!) 146/92  Pulse: 87  Temp: 98.4 F (36.9 C)  SpO2: 97%   BP Readings from Last 3 Encounters:  06/07/20 (!) 146/92  05/24/20 138/82  03/11/20 (!) 139/91   Wt Readings from Last 3 Encounters:  06/07/20 (!) 336 lb 6.4 oz (152.6 kg)  05/24/20 (!) 334 lb 6.4 oz (151.7 kg)  03/11/20 (!) 324 lb 4.8 oz (147.1 kg)   Body mass index is 45.62 kg/m.   Physical Exam Constitutional:      General: He is not in acute distress.    Appearance: Normal appearance. He is not ill-appearing.  Musculoskeletal:        General: No tenderness.  Skin:    General: Skin is warm and dry.  Neurological:     Mental Status: He is alert.  Sensory: No sensory deficit.     Motor: Weakness (minimal LLE) present.            Assessment & Plan:    See Problem List for Assessment and Plan of chronic medical problems.    This visit occurred during the SARS-CoV-2 public health emergency.  Safety protocols were in place, including screening questions prior to the visit, additional usage of staff PPE, and extensive cleaning of exam room while observing appropriate contact time as indicated for disinfecting  solutions.

## 2020-06-07 ENCOUNTER — Ambulatory Visit: Payer: BC Managed Care – PPO | Admitting: Internal Medicine

## 2020-06-07 ENCOUNTER — Other Ambulatory Visit: Payer: Self-pay

## 2020-06-07 ENCOUNTER — Ambulatory Visit (INDEPENDENT_AMBULATORY_CARE_PROVIDER_SITE_OTHER): Payer: BC Managed Care – PPO

## 2020-06-07 ENCOUNTER — Other Ambulatory Visit: Payer: Self-pay | Admitting: Internal Medicine

## 2020-06-07 ENCOUNTER — Encounter: Payer: Self-pay | Admitting: Internal Medicine

## 2020-06-07 VITALS — BP 146/92 | HR 87 | Temp 98.4°F | Ht 72.0 in | Wt 336.4 lb

## 2020-06-07 DIAGNOSIS — M5136 Other intervertebral disc degeneration, lumbar region: Secondary | ICD-10-CM | POA: Diagnosis not present

## 2020-06-07 DIAGNOSIS — M25552 Pain in left hip: Secondary | ICD-10-CM | POA: Diagnosis not present

## 2020-06-07 DIAGNOSIS — G8929 Other chronic pain: Secondary | ICD-10-CM

## 2020-06-07 DIAGNOSIS — M5416 Radiculopathy, lumbar region: Secondary | ICD-10-CM

## 2020-06-07 DIAGNOSIS — M1612 Unilateral primary osteoarthritis, left hip: Secondary | ICD-10-CM | POA: Diagnosis not present

## 2020-06-07 DIAGNOSIS — I10 Essential (primary) hypertension: Secondary | ICD-10-CM | POA: Diagnosis not present

## 2020-06-07 NOTE — Assessment & Plan Note (Signed)
Chronic Blood pressure elevated here today, but some of them likely related to his current pain No change in medication today-continue amlodipine-benazepril 10-40 mg daily, metoprolol 150 XL milligrams daily Will need to increase blood pressure medication if his blood pressure is still elevated at his follow-up routine visit

## 2020-06-07 NOTE — Patient Instructions (Addendum)
Have xrays down stairs.    MRI will be ordered.  A referral was ordered for Dr Arneta Cliche

## 2020-06-07 NOTE — Assessment & Plan Note (Signed)
Acute on chronic Worsening groin and left posterior hip pain Likely related to lower back issues and lumbar radiculopathy, but will go ahead and get an x-ray of his left hip to make sure Deferred any pain medication

## 2020-06-07 NOTE — Assessment & Plan Note (Signed)
Acute on chronic Has had pain for years, but worse in the past few months Pain in posterior hip, lower back pain radiating down left leg associated with some leg weakness, numbness/tingling in leg and groin pain with standing Denies any fevers, changes in bowel or bladder or numbness/tingling in groin region Deferred gabapentin or other pain medication Deferred PT We will get x-ray today Will order MRI of lumbar spine Will refer to neurosurgery-Dr. Christella Noa did his previous and he would like to see him

## 2020-06-21 ENCOUNTER — Other Ambulatory Visit: Payer: Self-pay | Admitting: Internal Medicine

## 2020-06-26 ENCOUNTER — Other Ambulatory Visit: Payer: Self-pay

## 2020-06-26 ENCOUNTER — Ambulatory Visit
Admission: RE | Admit: 2020-06-26 | Discharge: 2020-06-26 | Disposition: A | Discharge status: Home / Self Care | Payer: BC Managed Care – PPO | Source: Ambulatory Visit | Attending: Internal Medicine | Admitting: Internal Medicine

## 2020-06-26 DIAGNOSIS — M48061 Spinal stenosis, lumbar region without neurogenic claudication: Secondary | ICD-10-CM | POA: Diagnosis not present

## 2020-06-26 DIAGNOSIS — M545 Low back pain, unspecified: Secondary | ICD-10-CM | POA: Diagnosis not present

## 2020-06-26 DIAGNOSIS — M5416 Radiculopathy, lumbar region: Secondary | ICD-10-CM

## 2020-06-28 DIAGNOSIS — M5442 Lumbago with sciatica, left side: Secondary | ICD-10-CM | POA: Diagnosis not present

## 2020-06-28 DIAGNOSIS — M25552 Pain in left hip: Secondary | ICD-10-CM | POA: Diagnosis not present

## 2020-07-24 NOTE — Progress Notes (Signed)
Subjective:    Patient ID: Patrick Butler, male    DOB: [birth year], 60 y.o.   MRN: [id number]  HPI The patient is here for follow up of their chronic medical problems, including DM, htn, hyperlipidemia, hypothyroidism, GERD, anxiety   He still has a lot of back pain - he saw neurosurgery.  He did not see anything on the scan that would cause his pain.  He wanted to get an MRI of his hip.  He is still waiting to hear from them to schedule this.    Sugar the other day was 112.  He was dizzy the other day - he checked his BP and this SBP was low high 90's.  He only has dizziness on occasion.   Medications and allergies reviewed with patient and updated if appropriate.  Patient Active Problem List   Diagnosis Date Noted  . Lumbar radiculopathy 06/07/2020  . Hip pain, chronic, left 06/07/2020  . Leukocytosis 07/23/2019  . Colitis 07/23/2018  . Anxiety 07/12/2016  . Urinary frequency 01/13/2016  . Acute medial meniscus tear of left knee 10/28/2015  . History of colonic polyps   . History of cancer tonsil 07/13/2015  . Erectile dysfunction 07/13/2015  . Type 2 diabetes mellitus ([place]) 01/12/2014  . Severe obesity (BMI >= 40) ([place]) 10/08/2013  . Nonspecific abnormal electrocardiogram (ECG) (EKG) 09/22/2012  . OSA (obstructive sleep apnea) 08/08/2011  . Lumbar disc prolapse with compression radiculopathy 05/24/2011  . GERD 01/18/2009  . HYPERPLASIA PROSTATE UNS W/O UR OBST & OTH LUTS 01/18/2009  . Hypothyroidism 03/23/2008  . Hyperlipidemia 03/23/2008  . Essential hypertension 03/23/2008    Current Outpatient Medications on File Prior to Visit  Medication Sig Dispense Refill  . amLODipine-benazepril (LOTREL) 10-40 MG capsule TAKE 1 CAPSULE BY MOUTH DAILY 90 capsule 1  . aspirin EC 81 MG tablet Take 81 mg by mouth every evening.     . cetirizine (ZYRTEC) 10 MG tablet Take 10 mg by mouth daily.    . fluticasone (FLONASE) 50 MCG/ACT nasal spray Place 2 sprays into the nose as  directed. At bedtime (Patient taking differently: Place 2 sprays into the nose at bedtime.) 16 g 5  . ketoconazole (NIZORAL) 2 % shampoo Apply 1 application topically 2 (two) times a week.    Marland Kitchen LORazepam (ATIVAN) 0.5 MG tablet TAKE 1 TABLET BY MOUTH DAILY AS NEEDED FOR ANXIETY 30 tablet 0  . metFORMIN (GLUCOPHAGE) 500 MG tablet TAKE 1 TABLET BY MOUTH TWICE DAILY WITH MEALS 180 tablet 1  . metoprolol succinate (TOPROL-XL) 100 MG 24 hr tablet TAKE 1 TABLET BY MOUTH DAILY WITH A MEAL 90 tablet 1  . metoprolol succinate (TOPROL-XL) 50 MG 24 hr tablet Take 1 tablet (50 mg total) by mouth daily. Take with or immediately following a meal.  Total of 150 mg daily 90 tablet 3  . Multiple Vitamins-Minerals (MULTIVITAMIN WITH MINERALS) tablet Take 1 tablet by mouth daily.     Marland Kitchen OZEMPIC, 1 MG/DOSE, 4 MG/3ML SOPN INJECT 1 MG INTO THE SKIN ONCE A WEEK 3 mL 2  . pantoprazole (PROTONIX) 40 MG tablet TAKE 1 TABLET BY MOUTH DAILY 90 tablet 0  . pravastatin (PRAVACHOL) 40 MG tablet TAKE 1 TABLET BY MOUTH DAILY 90 tablet 1  . tadalafil (CIALIS) 5 MG tablet Take 5 mg by mouth daily.    Marland Kitchen tolterodine (DETROL LA) 4 MG 24 hr capsule Take 4 mg by mouth every evening.      No current facility-administered medications  on file prior to visit.    Past Medical History:  Diagnosis Date  . Anxiety   . Arthritis    right knee  . Cancer ([place]) 2005   tonsils/Chemo and radiation  . Dysphagia 08/2014  . Dyspnea    with exertion  . Full dentures   . GERD (gastroesophageal reflux disease)   . History of chemotherapy   . History of radiation therapy    neck  . Hyperlipidemia   . Hypertension    states under control with med., has been on med. ~ 5 yr.  . Hypothyroidism   . Non-insulin dependent type 2 diabetes mellitus ([place])   . Posterior neck pain   . Seasonal allergies   . Sleep apnea    uses CPAP nightly    Past Surgical History:  Procedure Laterality Date  . COLONOSCOPY  2012  . COLONOSCOPY WITH PROPOFOL N/A  10/04/2015   Procedure: COLONOSCOPY WITH PROPOFOL;  Surgeon: Doran Stabler, MD;  [facility];  Service: Gastroenterology;  Laterality: N/A;  . DIRECT LARYNGOSCOPY N/A 08/20/2014   Procedure: DIRECT LARYNGOSCOPY;  Surgeon: Rozetta Nunnery, MD;  Location: [facility];  Service: ENT;  Laterality: N/A;  . DIRECT LARYNGOSCOPY N/A 08/21/2016   Procedure: DIRECT LARYNGOSCOPY;  Surgeon: Rozetta Nunnery, MD;  Location: [facility];  Service: ENT;  Laterality: N/A;  . DIRECT LARYNGOSCOPY N/A 03/11/2020   Procedure: DIRECT LARYNGOSCOPY;  Surgeon: Rozetta Nunnery, MD;  Location: [facility];  Service: ENT;  Laterality: N/A;  . ESOPHAGEAL DILATION N/A 03/11/2020   Procedure: INSERTION OF GUIDE WIRE FOLLOWED BY DILATION OVER GUIDE WIRE;  Surgeon: Rozetta Nunnery, MD;  Location: [facility];  Service: ENT;  Laterality: N/A;  . ESOPHAGOSCOPY WITH DILITATION N/A 08/20/2014   Procedure: ESOPHAGOSCOPY WITH DILATATION;  Surgeon: Rozetta Nunnery, MD;  Location: [facility];  Service: ENT;  Laterality: N/A;  . ESOPHAGOSCOPY WITH DILITATION N/A 08/21/2016   Procedure: ESOPHAGOSCOPY WITH DILITATION;  Surgeon: Rozetta Nunnery, MD;  Location: [facility];  Service: ENT;  Laterality: N/A;  . KNEE ARTHROPLASTY Right   . KNEE ARTHROSCOPY Right    x 3  . KNEE ARTHROSCOPY Left 10/28/2015   Procedure: ARTHROSCOPY KNEE;  Surgeon: Dorna Leitz, MD;  Location: [facility];  Service: Orthopedics;  Laterality: Left;  left knee partial medial meniscectomy and patellofemoral chondroplasty  . LAPAROSCOPIC GASTRIC BYPASS  2004  . LUMBAR LAMINECTOMY/DECOMPRESSION MICRODISCECTOMY  05/24/2011   Procedure: LUMBAR LAMINECTOMY/DECOMPRESSION MICRODISCECTOMY;  Surgeon: Winfield Cunas, MD;  Location: [facility];  Service: Neurosurgery;  Laterality: Right;  RIGHT L5-S1 Laminectomy, discectomy  . LYMPHADENECTOMY Right    cervical  . MULTIPLE TOOTH EXTRACTIONS    .  PORT-A-CATH REMOVAL    . PORTACATH PLACEMENT    . REPLACEMENT TOTAL KNEE Right 03/22/2004  . SHOULDER ARTHROSCOPY Left   . SHOULDER OPEN ROTATOR CUFF REPAIR Right     Social History   Socioeconomic History  . Marital status: Married    Spouse name: [name]  . Number of children: Not on file  . Years of education: Not on file  . Highest education level: Not on file  Occupational History  . Occupation: Information systems manager: [employer]  Tobacco Use  . Smoking status: Former Smoker    Packs/day: 0.00    Years: 25.00    Pack years: 0.00    Quit date: 10/25/2002    Years since quitting:  17.7  . Smokeless tobacco: Never Used  Vaping Use  . Vaping Use: Never used  Substance and Sexual Activity  . Alcohol use: Not Currently    Alcohol/week: 3.0 - 4.0 standard drinks    Types: 3 - 4 Standard drinks or equivalent per week    Comment: 08/17/16- " no desire in past 3- 4 months  . Drug use: No  . Sexual activity: Yes  Other Topics Concern  . Not on file  Social History Narrative  . Not on file   Social Determinants of Health   Financial Resource Strain: Not on file  Food Insecurity: Not on file  Transportation Needs: Not on file  Physical Activity: Not on file  Stress: Not on file  Social Connections: Not on file    Family History  Problem Relation Age of Onset  . Colon cancer Paternal Uncle   . Heart attack Maternal Uncle         X 2; both pre 55  . Hypertension Mother   . Breast cancer Mother   . Coronary artery disease Mother   . Brain cancer Paternal Uncle   . Diabetes Maternal Grandfather   . Hypertension Maternal Grandfather        ? metastatic; ? primary  . Diabetes Father   . Stroke Father 37  . Dementia Father     Review of Systems  Constitutional: Negative for fever.  Respiratory: Negative for cough, shortness of breath and wheezing.   Cardiovascular: Positive for leg swelling (occ). Negative for chest pain and palpitations.   Musculoskeletal: Positive for back pain.  Neurological: Positive for dizziness (occ - ? related to low BP) and headaches (occ).       Objective:   Vitals:   07/25/20 0846 07/25/20 0924  BP: (!) 140/92 138/82  Pulse: 100   Temp: 98.3 F (36.8 C)   SpO2: 91%    BP Readings from Last 3 Encounters:  07/25/20 138/82  06/07/20 (!) 146/92  05/24/20 138/82   Wt Readings from Last 3 Encounters:  07/25/20 (!) 335 lb (152 kg)  06/07/20 (!) 336 lb 6.4 oz (152.6 kg)  05/24/20 (!) 334 lb 6.4 oz (151.7 kg)   Body mass index is 45.43 kg/m.   Physical Exam    Constitutional: Appears well-developed and well-nourished. No distress.  HENT:  Head: Normocephalic and atraumatic.  Neck: Neck supple. No tracheal deviation present. No thyromegaly present.  No cervical lymphadenopathy Cardiovascular: Normal rate, regular rhythm and normal heart sounds.   No murmur heard. No carotid bruit .  No edema Pulmonary/Chest: Effort normal and breath sounds normal. No respiratory distress. No has no wheezes. No rales.  Skin: Skin is warm and dry. Not diaphoretic.  Psychiatric: Normal mood and affect. Behavior is normal.      Assessment & Plan:    See Problem List for Assessment and Plan of chronic medical problems.    This visit occurred during the SARS-CoV-2 public health emergency.  Safety protocols were in place, including screening questions prior to the visit, additional usage of staff PPE, and extensive cleaning of exam room while observing appropriate contact time as indicated for disinfecting solutions.

## 2020-07-24 NOTE — Patient Instructions (Addendum)
    Blood work was ordered.     Medications changes include :  none   Your prescription(s) have been submitted to your pharmacy. Please take as directed and contact our office if you believe you are having problem(s) with the medication(s).   Please followup in 6 months  

## 2020-07-25 ENCOUNTER — Encounter: Payer: Self-pay | Admitting: Internal Medicine

## 2020-07-25 ENCOUNTER — Other Ambulatory Visit: Payer: Self-pay | Admitting: Internal Medicine

## 2020-07-25 ENCOUNTER — Ambulatory Visit: Payer: BC Managed Care – PPO | Admitting: Internal Medicine

## 2020-07-25 ENCOUNTER — Other Ambulatory Visit: Payer: Self-pay

## 2020-07-25 ENCOUNTER — Other Ambulatory Visit: Payer: Self-pay | Admitting: Neurosurgery

## 2020-07-25 VITALS — BP 138/82 | HR 100 | Temp 98.3°F | Ht 72.0 in | Wt 335.0 lb

## 2020-07-25 DIAGNOSIS — M25559 Pain in unspecified hip: Secondary | ICD-10-CM

## 2020-07-25 DIAGNOSIS — E1141 Type 2 diabetes mellitus with diabetic mononeuropathy: Secondary | ICD-10-CM

## 2020-07-25 DIAGNOSIS — E039 Hypothyroidism, unspecified: Secondary | ICD-10-CM | POA: Diagnosis not present

## 2020-07-25 DIAGNOSIS — K219 Gastro-esophageal reflux disease without esophagitis: Secondary | ICD-10-CM

## 2020-07-25 DIAGNOSIS — E7849 Other hyperlipidemia: Secondary | ICD-10-CM | POA: Diagnosis not present

## 2020-07-25 DIAGNOSIS — F419 Anxiety disorder, unspecified: Secondary | ICD-10-CM

## 2020-07-25 DIAGNOSIS — I10 Essential (primary) hypertension: Secondary | ICD-10-CM

## 2020-07-25 LAB — TSH: TSH: 1.81 u[IU]/mL (ref 0.35–4.50)

## 2020-07-25 LAB — CBC WITH DIFFERENTIAL/PLATELET
Basophils Absolute: 0 10*3/uL (ref 0.0–0.1)
Basophils Relative: 0.5 % (ref 0.0–3.0)
Eosinophils Absolute: 0.1 10*3/uL (ref 0.0–0.7)
Eosinophils Relative: 1.3 % (ref 0.0–5.0)
HCT: 44.2 % (ref 39.0–52.0)
Hemoglobin: 14.4 g/dL (ref 13.0–17.0)
Lymphocytes Relative: 42 % (ref 12.0–46.0)
Lymphs Abs: 3.5 10*3/uL (ref 0.7–4.0)
MCHC: 32.6 g/dL (ref 30.0–36.0)
MCV: 87.5 fl (ref 78.0–100.0)
Monocytes Absolute: 0.7 10*3/uL (ref 0.1–1.0)
Monocytes Relative: 8 % (ref 3.0–12.0)
Neutro Abs: 4.1 10*3/uL (ref 1.4–7.7)
Neutrophils Relative %: 48.2 % (ref 43.0–77.0)
Platelets: 158 10*3/uL (ref 150.0–400.0)
RBC: 5.06 Mil/uL (ref 4.22–5.81)
RDW: 14.6 % (ref 11.5–15.5)
WBC: 8.5 10*3/uL (ref 4.0–10.5)

## 2020-07-25 LAB — COMPREHENSIVE METABOLIC PANEL
ALT: 15 U/L (ref 0–53)
AST: 15 U/L (ref 0–37)
Albumin: 4.1 g/dL (ref 3.5–5.2)
Alkaline Phosphatase: 61 U/L (ref 39–117)
BUN: 20 mg/dL (ref 6–23)
CO2: 27 mEq/L (ref 19–32)
Calcium: 9.4 mg/dL (ref 8.4–10.5)
Chloride: 104 mEq/L (ref 96–112)
Creatinine, Ser: 1.12 mg/dL (ref 0.40–1.50)
GFR: 70.78 mL/min (ref >60.00)
Glucose, Bld: 114 mg/dL — ABNORMAL HIGH (ref 70–99)
Potassium: 4.7 mEq/L (ref 3.5–5.1)
Sodium: 139 mEq/L (ref 135–145)
Total Bilirubin: 0.5 mg/dL (ref 0.2–1.2)
Total Protein: 7.2 g/dL (ref 6.0–8.3)

## 2020-07-25 LAB — LIPID PANEL
Cholesterol: 136 mg/dL (ref 0–200)
HDL: 43.7 mg/dL (ref >39.00)
LDL Cholesterol: 67 mg/dL (ref 0–99)
NonHDL: 92.47
Total CHOL/HDL Ratio: 3
Triglycerides: 129 mg/dL (ref 0.0–149.0)
VLDL: 25.8 mg/dL (ref 0.0–40.0)

## 2020-07-25 LAB — HEMOGLOBIN A1C: Hgb A1c MFr Bld: 6 % (ref 4.6–6.5)

## 2020-07-25 MED ORDER — LEVOTHYROXINE SODIUM 200 MCG PO TABS
200.0000 ug | ORAL_TABLET | Freq: Every day | ORAL | 1 refills | Status: DC
Start: 2020-07-25 — End: 2021-01-16

## 2020-07-25 NOTE — Assessment & Plan Note (Signed)
Chronic BP better on repeat and ok -- he has had some low BP measures at home associated with some dizziness - likely has a degree of white coat htn No changes today Continue lotrel 10-40 mg qd, metoprolol xl 150 mg qd cmp

## 2020-07-25 NOTE — Assessment & Plan Note (Signed)
Chronic GERD controlled Continue pantoprazole 40 mg daily 

## 2020-07-25 NOTE — Assessment & Plan Note (Signed)
Chronic Controlled, stable Continue ativan 0.5 mg daily prn

## 2020-07-25 NOTE — Assessment & Plan Note (Signed)
Chronic Check lipid panel  Continue pravastatin 40 mg daily Regular exercise and healthy diet encouraged  

## 2020-07-25 NOTE — Assessment & Plan Note (Signed)
Chronic  Clinically euthyroid Currently taking levothyroxine 200 mcg daily Check tsh  Titrate med dose if needed

## 2020-07-25 NOTE — Assessment & Plan Note (Signed)
Chronic Lab Results  Component Value Date   HGBA1C 5.8 (H) 01/25/2020   Controlled Continue ozempic 1 mg weekly, metformin 500 mg daily Consider stopping metformin depending on a1c

## 2020-07-26 ENCOUNTER — Encounter: Payer: Self-pay | Admitting: Internal Medicine

## 2020-08-05 ENCOUNTER — Other Ambulatory Visit: Payer: Self-pay | Admitting: Internal Medicine

## 2020-08-08 ENCOUNTER — Other Ambulatory Visit: Payer: Self-pay | Admitting: Internal Medicine

## 2020-08-10 ENCOUNTER — Other Ambulatory Visit: Payer: Self-pay | Admitting: Internal Medicine

## 2020-08-14 ENCOUNTER — Ambulatory Visit
Admission: RE | Admit: 2020-08-14 | Discharge: 2020-08-14 | Disposition: A | Discharge status: Home / Self Care | Payer: BC Managed Care – PPO | Source: Ambulatory Visit | Attending: Neurosurgery | Admitting: Neurosurgery

## 2020-08-14 ENCOUNTER — Other Ambulatory Visit: Payer: Self-pay

## 2020-08-14 DIAGNOSIS — M25559 Pain in unspecified hip: Secondary | ICD-10-CM

## 2020-08-14 DIAGNOSIS — M25452 Effusion, left hip: Secondary | ICD-10-CM | POA: Diagnosis not present

## 2020-08-14 DIAGNOSIS — M1612 Unilateral primary osteoarthritis, left hip: Secondary | ICD-10-CM | POA: Diagnosis not present

## 2020-08-14 DIAGNOSIS — S76312A Strain of muscle, fascia and tendon of the posterior muscle group at thigh level, left thigh, initial encounter: Secondary | ICD-10-CM | POA: Diagnosis not present

## 2020-08-14 DIAGNOSIS — M6258 Muscle wasting and atrophy, not elsewhere classified, other site: Secondary | ICD-10-CM | POA: Diagnosis not present

## 2020-08-17 ENCOUNTER — Other Ambulatory Visit: Payer: Self-pay | Admitting: Neurosurgery

## 2020-08-17 DIAGNOSIS — M542 Cervicalgia: Secondary | ICD-10-CM

## 2020-08-20 ENCOUNTER — Other Ambulatory Visit: Payer: Self-pay

## 2020-08-20 ENCOUNTER — Ambulatory Visit
Admission: RE | Admit: 2020-08-20 | Discharge: 2020-08-20 | Disposition: A | Discharge status: Home / Self Care | Payer: BC Managed Care – PPO | Source: Ambulatory Visit | Attending: Neurosurgery | Admitting: Neurosurgery

## 2020-08-20 DIAGNOSIS — M4802 Spinal stenosis, cervical region: Secondary | ICD-10-CM | POA: Diagnosis not present

## 2020-08-20 DIAGNOSIS — M542 Cervicalgia: Secondary | ICD-10-CM

## 2020-08-23 DIAGNOSIS — N3090 Cystitis, unspecified without hematuria: Secondary | ICD-10-CM | POA: Diagnosis not present

## 2020-08-23 DIAGNOSIS — E119 Type 2 diabetes mellitus without complications: Secondary | ICD-10-CM | POA: Diagnosis not present

## 2020-08-23 DIAGNOSIS — N3001 Acute cystitis with hematuria: Secondary | ICD-10-CM | POA: Diagnosis not present

## 2020-08-26 ENCOUNTER — Other Ambulatory Visit: Payer: BC Managed Care – PPO

## 2020-08-29 DIAGNOSIS — M542 Cervicalgia: Secondary | ICD-10-CM | POA: Diagnosis not present

## 2020-09-12 DIAGNOSIS — M1612 Unilateral primary osteoarthritis, left hip: Secondary | ICD-10-CM | POA: Diagnosis not present

## 2020-09-20 ENCOUNTER — Other Ambulatory Visit: Payer: Self-pay | Admitting: Internal Medicine

## 2020-09-23 DIAGNOSIS — M25552 Pain in left hip: Secondary | ICD-10-CM | POA: Diagnosis not present

## 2020-10-06 DIAGNOSIS — M47812 Spondylosis without myelopathy or radiculopathy, cervical region: Secondary | ICD-10-CM | POA: Diagnosis not present

## 2020-10-11 ENCOUNTER — Other Ambulatory Visit: Payer: Self-pay | Admitting: Internal Medicine

## 2020-11-02 ENCOUNTER — Other Ambulatory Visit: Payer: Self-pay | Admitting: Internal Medicine

## 2020-11-24 DIAGNOSIS — G4733 Obstructive sleep apnea (adult) (pediatric): Secondary | ICD-10-CM | POA: Diagnosis not present

## 2020-12-05 DIAGNOSIS — H25013 Cortical age-related cataract, bilateral: Secondary | ICD-10-CM | POA: Diagnosis not present

## 2020-12-05 DIAGNOSIS — H2513 Age-related nuclear cataract, bilateral: Secondary | ICD-10-CM | POA: Diagnosis not present

## 2020-12-05 DIAGNOSIS — E113291 Type 2 diabetes mellitus with mild nonproliferative diabetic retinopathy without macular edema, right eye: Secondary | ICD-10-CM | POA: Diagnosis not present

## 2020-12-05 DIAGNOSIS — H524 Presbyopia: Secondary | ICD-10-CM | POA: Diagnosis not present

## 2020-12-05 LAB — HM DIABETES EYE EXAM

## 2020-12-15 ENCOUNTER — Other Ambulatory Visit: Payer: Self-pay | Admitting: Internal Medicine

## 2021-01-12 ENCOUNTER — Encounter: Payer: Self-pay | Admitting: Internal Medicine

## 2021-01-12 NOTE — Progress Notes (Signed)
Outside notes received. Information abstracted. Notes sent to scan.  

## 2021-01-16 ENCOUNTER — Other Ambulatory Visit: Payer: Self-pay | Admitting: Internal Medicine

## 2021-01-24 NOTE — Progress Notes (Signed)
Subjective:    Patient ID: Patrick Butler, male    DOB: [birth year], 60 y.o.   MRN: [id number]  This visit occurred during the SARS-CoV-2 public health emergency.  Safety protocols were in place, including screening questions prior to the visit, additional usage of staff PPE, and extensive cleaning of exam room while observing appropriate contact time as indicated for disinfecting solutions.     HPI The patient is here for follow up of their chronic medical problems, including htn, DM, hichol, hypothyroidism, GERD, anxiety  His lower legs have been swelling.  No change in salt intake or activity.  He is not exercising regularly.  He denies any shortness of breath.   Medications and allergies reviewed with patient and updated if appropriate.  Patient Active Problem List   Diagnosis Date Noted   Lumbar radiculopathy 06/07/2020   Hip pain, chronic, left 06/07/2020   Leukocytosis 07/23/2019   Colitis 07/23/2018   Anxiety 07/12/2016   Urinary frequency 01/13/2016   Acute medial meniscus tear of left knee 10/28/2015   History of colonic polyps    History of cancer tonsil 07/13/2015   Erectile dysfunction 07/13/2015   Type 2 diabetes mellitus ([place]) 01/12/2014   Severe obesity (BMI >= 40) ([place]) 10/08/2013   Nonspecific abnormal electrocardiogram (ECG) (EKG) 09/22/2012   OSA (obstructive sleep apnea) 08/08/2011   Lumbar disc prolapse with compression radiculopathy 05/24/2011   GERD 01/18/2009   HYPERPLASIA PROSTATE UNS W/O UR OBST & OTH LUTS 01/18/2009   Hypothyroidism 03/23/2008   Hyperlipidemia 03/23/2008   Essential hypertension 03/23/2008    Current Outpatient Medications on File Prior to Visit  Medication Sig Dispense Refill   amLODipine-benazepril (LOTREL) 10-40 MG capsule TAKE 1 CAPSULE BY MOUTH DAILY 90 capsule 1   aspirin EC 81 MG tablet Take 81 mg by mouth every evening.      cetirizine (ZYRTEC) 10 MG tablet Take 10 mg by mouth daily.     fluticasone (FLONASE) 50  MCG/ACT nasal spray Place 2 sprays into the nose as directed. At bedtime (Patient taking differently: Place 2 sprays into the nose at bedtime.) 16 g 5   ketoconazole (NIZORAL) 2 % shampoo Apply 1 application topically 2 (two) times a week.     levothyroxine (SYNTHROID) 200 MCG tablet TAKE 1 TABLET BY MOUTH DAILY BEFORE BREAKFAST 90 tablet 1   LORazepam (ATIVAN) 0.5 MG tablet TAKE 1 TABLET BY MOUTH DAILY AS NEEDED FOR ANXIETY 30 tablet 0   metoprolol succinate (TOPROL-XL) 100 MG 24 hr tablet TAKE 1 TABLET BY MOUTH DAILY WITH A MEAL 90 tablet 1   metoprolol succinate (TOPROL-XL) 50 MG 24 hr tablet TAKE 1 TABLET BY MOUTH DAILY WITH 100MG  TO TOTAL 150MG  DAILY 90 tablet 3   Multiple Vitamins-Minerals (MULTIVITAMIN WITH MINERALS) tablet Take 1 tablet by mouth daily.      OZEMPIC, 1 MG/DOSE, 4 MG/3ML SOPN INJECT 1 MG INTO THE SKIN ONCE A WEEK 3 mL 2   pantoprazole (PROTONIX) 40 MG tablet TAKE 1 TABLET BY MOUTH DAILY 90 tablet 0   pravastatin (PRAVACHOL) 40 MG tablet TAKE 1 TABLET BY MOUTH DAILY 90 tablet 1   tadalafil (CIALIS) 5 MG tablet Take 5 mg by mouth daily.     tolterodine (DETROL LA) 4 MG 24 hr capsule Take 4 mg by mouth every evening.      No current facility-administered medications on file prior to visit.    Past Medical History:  Diagnosis Date   Anxiety  Arthritis    right knee   Cancer ([place]) 2005   tonsils/Chemo and radiation   Dysphagia 08/2014   Dyspnea    with exertion   Full dentures    GERD (gastroesophageal reflux disease)    History of chemotherapy    History of radiation therapy    neck   Hyperlipidemia    Hypertension    states under control with med., has been on med. ~ 5 yr.   Hypothyroidism    Non-insulin dependent type 2 diabetes mellitus (HCC)    Posterior neck pain    Seasonal allergies    Sleep apnea    uses CPAP nightly    Past Surgical History:  Procedure Laterality Date   COLONOSCOPY  2012   COLONOSCOPY WITH PROPOFOL N/A 10/04/2015   Procedure:  COLONOSCOPY WITH PROPOFOL;  Surgeon: Doran Stabler, MD;  Location: [facility];  Service: Gastroenterology;  Laterality: N/A;   DIRECT LARYNGOSCOPY N/A 08/20/2014   Procedure: DIRECT LARYNGOSCOPY;  Surgeon: Rozetta Nunnery, MD;  Location: [place];  Service: ENT;  Laterality: N/A;   DIRECT LARYNGOSCOPY N/A 08/21/2016   Procedure: DIRECT LARYNGOSCOPY;  Surgeon: Rozetta Nunnery, MD;  Location: [place];  Service: ENT;  Laterality: N/A;   DIRECT LARYNGOSCOPY N/A 03/11/2020   Procedure: DIRECT LARYNGOSCOPY;  Surgeon: Rozetta Nunnery, MD;  Location: [place];  Service: ENT;  Laterality: N/A;   ESOPHAGEAL DILATION N/A 03/11/2020   Procedure: INSERTION OF GUIDE WIRE FOLLOWED BY DILATION OVER GUIDE WIRE;  Surgeon: Rozetta Nunnery, MD;  Location: [place];  Service: ENT;  Laterality: N/A;   ESOPHAGOSCOPY WITH DILITATION N/A 08/20/2014   Procedure: ESOPHAGOSCOPY WITH DILATATION;  Surgeon: Rozetta Nunnery, MD;  Location: [place];  Service: ENT;  Laterality: N/A;   ESOPHAGOSCOPY WITH DILITATION N/A 08/21/2016   Procedure: ESOPHAGOSCOPY WITH DILITATION;  Surgeon: Rozetta Nunnery, MD;  Location: [place];  Service: ENT;  Laterality: N/A;   KNEE ARTHROPLASTY Right    KNEE ARTHROSCOPY Right    x 3   KNEE ARTHROSCOPY Left 10/28/2015   Procedure: ARTHROSCOPY KNEE;  Surgeon: Dorna Leitz, MD;  Location: [place];  Service: Orthopedics;  Laterality: Left;  left knee partial medial meniscectomy and patellofemoral chondroplasty   LAPAROSCOPIC GASTRIC BYPASS  2004   LUMBAR LAMINECTOMY/DECOMPRESSION MICRODISCECTOMY  05/24/2011   Procedure: LUMBAR LAMINECTOMY/DECOMPRESSION MICRODISCECTOMY;  Surgeon: Winfield Cunas, MD;  Location: [facility];  Service: Neurosurgery;  Laterality: Right;  RIGHT L5-S1 Laminectomy, discectomy   LYMPHADENECTOMY Right    cervical   MULTIPLE TOOTH EXTRACTIONS     PORT-A-CATH REMOVAL     PORTACATH  PLACEMENT     REPLACEMENT TOTAL KNEE Right 03/22/2004   SHOULDER ARTHROSCOPY Left    SHOULDER OPEN ROTATOR CUFF REPAIR Right     Social History   Socioeconomic History   Marital status: Married    Spouse name: [name]   Number of children: Not on file   Years of education: Not on file   Highest education level: Not on file  Occupational History   Occupation: Information systems manager: [employer]  Tobacco Use   Smoking status: Former    Packs/day: 0.00    Years: 25.00    Pack years: 0.00    Types: Cigarettes    Quit date: 10/25/2002    Years since quitting: 18.2   Smokeless tobacco: Never  Vaping Use   Vaping Use: Never used  Substance and Sexual Activity   Alcohol use: Not Currently    Alcohol/week: 3.0 - 4.0 standard drinks    Types: 3 - 4 Standard drinks or equivalent per week    Comment: 08/17/16- " no desire in past 3- 4 months   Drug use: No   Sexual activity: Yes  Other Topics Concern   Not on file  Social History Narrative   Not on file   Social Determinants of Health   Financial Resource Strain: Not on file  Food Insecurity: Not on file  Transportation Needs: Not on file  Physical Activity: Not on file  Stress: Not on file  Social Connections: Not on file    Family History  Problem Relation Age of Onset   Colon cancer Paternal Uncle    Heart attack Maternal Uncle         X 2; both pre 26   Hypertension Mother    Breast cancer Mother    Coronary artery disease Mother    Brain cancer Paternal Uncle    Diabetes Maternal Grandfather    Hypertension Maternal Grandfather        ? metastatic; ? primary   Diabetes Father    Stroke Father 33   Dementia Father     Review of Systems  Constitutional:  Negative for chills and fever.  Respiratory:  Negative for cough, shortness of breath and wheezing.   Cardiovascular:  Positive for leg swelling. Negative for chest pain and palpitations.  Neurological:  Positive for dizziness (occ) and headaches  (occ).      Objective:   Vitals:   01/25/21 0903  BP: 140/84  Pulse: 93  Temp: 98.4 F (36.9 C)  SpO2: 99%   BP Readings from Last 3 Encounters:  01/25/21 140/84  07/25/20 138/82  06/07/20 (!) 146/92   Wt Readings from Last 3 Encounters:  01/25/21 (!) 330 lb (149.7 kg)  07/25/20 (!) 335 lb (152 kg)  06/07/20 (!) 336 lb 6.4 oz (152.6 kg)   Body mass index is 44.76 kg/m.   Physical Exam    Constitutional: Appears well-developed and well-nourished. No distress.  HENT:  Head: Normocephalic and atraumatic.  Neck: Neck supple. No tracheal deviation present. No thyromegaly present.  No cervical lymphadenopathy Cardiovascular: Normal rate, regular rhythm and normal heart sounds.   No murmur heard. No carotid bruit .  1 + b/l LE edema Pulmonary/Chest: Effort normal and breath sounds normal. No respiratory distress. No has no wheezes. No rales.  Skin: Skin is warm and dry. Not diaphoretic.  Psychiatric: Normal mood and affect. Behavior is normal.      Assessment & Plan:   Flu vaccine given today  See Problem List for Assessment and Plan of chronic medical problems.

## 2021-01-24 NOTE — Patient Instructions (Addendum)
  Blood work was ordered.    Flu immunization administered today.     Medications changes include :   none  Your prescription(s) have been submitted to your pharmacy. Please take as directed and contact our office if you believe you are having problem(s) with the medication(s).   A Echo was ordered .       Someone will call you to schedule an appointment.    Please followup in 6 months

## 2021-01-25 ENCOUNTER — Encounter: Payer: Self-pay | Admitting: Internal Medicine

## 2021-01-25 ENCOUNTER — Other Ambulatory Visit: Payer: Self-pay

## 2021-01-25 ENCOUNTER — Ambulatory Visit (INDEPENDENT_AMBULATORY_CARE_PROVIDER_SITE_OTHER): Payer: BC Managed Care – PPO | Admitting: Internal Medicine

## 2021-01-25 VITALS — BP 140/84 | HR 93 | Temp 98.4°F | Ht 72.0 in | Wt 330.0 lb

## 2021-01-25 DIAGNOSIS — F419 Anxiety disorder, unspecified: Secondary | ICD-10-CM

## 2021-01-25 DIAGNOSIS — E1141 Type 2 diabetes mellitus with diabetic mononeuropathy: Secondary | ICD-10-CM | POA: Diagnosis not present

## 2021-01-25 DIAGNOSIS — Z1211 Encounter for screening for malignant neoplasm of colon: Secondary | ICD-10-CM

## 2021-01-25 DIAGNOSIS — I10 Essential (primary) hypertension: Secondary | ICD-10-CM | POA: Diagnosis not present

## 2021-01-25 DIAGNOSIS — E039 Hypothyroidism, unspecified: Secondary | ICD-10-CM | POA: Diagnosis not present

## 2021-01-25 DIAGNOSIS — K219 Gastro-esophageal reflux disease without esophagitis: Secondary | ICD-10-CM

## 2021-01-25 DIAGNOSIS — E7849 Other hyperlipidemia: Secondary | ICD-10-CM

## 2021-01-25 DIAGNOSIS — M7989 Other specified soft tissue disorders: Secondary | ICD-10-CM

## 2021-01-25 LAB — CBC WITH DIFFERENTIAL/PLATELET
Basophils Absolute: 0 10*3/uL (ref 0.0–0.1)
Basophils Relative: 0.4 % (ref 0.0–3.0)
Eosinophils Absolute: 0.1 10*3/uL (ref 0.0–0.7)
Eosinophils Relative: 1.2 % (ref 0.0–5.0)
HCT: 43.2 % (ref 39.0–52.0)
Hemoglobin: 14.2 g/dL (ref 13.0–17.0)
Lymphocytes Relative: 43.4 % (ref 12.0–46.0)
Lymphs Abs: 3.8 10*3/uL (ref 0.7–4.0)
MCHC: 32.9 g/dL (ref 30.0–36.0)
MCV: 88.2 fl (ref 78.0–100.0)
Monocytes Absolute: 0.7 10*3/uL (ref 0.1–1.0)
Monocytes Relative: 8.6 % (ref 3.0–12.0)
Neutro Abs: 4 10*3/uL (ref 1.4–7.7)
Neutrophils Relative %: 46.4 % (ref 43.0–77.0)
Platelets: 184 10*3/uL (ref 150.0–400.0)
RBC: 4.9 Mil/uL (ref 4.22–5.81)
RDW: 14.1 % (ref 11.5–15.5)
WBC: 8.6 10*3/uL (ref 4.0–10.5)

## 2021-01-25 LAB — COMPREHENSIVE METABOLIC PANEL
ALT: 13 U/L (ref 0–53)
AST: 14 U/L (ref 0–37)
Albumin: 4.1 g/dL (ref 3.5–5.2)
Alkaline Phosphatase: 59 U/L (ref 39–117)
BUN: 25 mg/dL — ABNORMAL HIGH (ref 6–23)
CO2: 28 mEq/L (ref 19–32)
Calcium: 9.7 mg/dL (ref 8.4–10.5)
Chloride: 103 mEq/L (ref 96–112)
Creatinine, Ser: 1.19 mg/dL (ref 0.40–1.50)
GFR: 65.58 mL/min (ref >60.00)
Glucose, Bld: 101 mg/dL — ABNORMAL HIGH (ref 70–99)
Potassium: 4.5 mEq/L (ref 3.5–5.1)
Sodium: 138 mEq/L (ref 135–145)
Total Bilirubin: 0.4 mg/dL (ref 0.2–1.2)
Total Protein: 7.6 g/dL (ref 6.0–8.3)

## 2021-01-25 LAB — HEMOGLOBIN A1C: Hgb A1c MFr Bld: 6 % (ref 4.6–6.5)

## 2021-01-25 LAB — TSH: TSH: 1 u[IU]/mL (ref 0.35–5.50)

## 2021-01-25 LAB — LIPID PANEL
Cholesterol: 124 mg/dL (ref 0–200)
HDL: 37.1 mg/dL — ABNORMAL LOW (ref >39.00)
LDL Cholesterol: 53 mg/dL (ref 0–99)
NonHDL: 87.02
Total CHOL/HDL Ratio: 3
Triglycerides: 172 mg/dL — ABNORMAL HIGH (ref 0.0–149.0)
VLDL: 34.4 mg/dL (ref 0.0–40.0)

## 2021-01-25 MED ORDER — OZEMPIC (1 MG/DOSE) 4 MG/3ML ~~LOC~~ SOPN: 1.0000 mg | PEN_INJECTOR | SUBCUTANEOUS | 2 refills | Status: DC

## 2021-01-25 NOTE — Assessment & Plan Note (Signed)
Chronic Controlled, stable Continue lorazepam 0.5 mg daily as needed

## 2021-01-25 NOTE — Assessment & Plan Note (Signed)
Chronic Lab Results  Component Value Date   HGBA1C 6.0 07/25/2020   Controlled Check A1c today Continue Ozempic 1 mg daily

## 2021-01-25 NOTE — Assessment & Plan Note (Signed)
Chronic Blood pressure well controlled Continue amlodipine-benazepril 10-40 mg daily, metoprolol XL 150 mg daily CMP

## 2021-01-25 NOTE — Assessment & Plan Note (Signed)
Chronic-at goal Check lipid panel  Continue pravastatin 40 mg daily  regular exercise and healthy diet encouraged  Lab Results  Component Value Date   LDLCALC 67 07/25/2020

## 2021-01-25 NOTE — Assessment & Plan Note (Signed)
Chronic GERD controlled Continue pantoprazole 40 mg daily 

## 2021-01-25 NOTE — Assessment & Plan Note (Signed)
Chronic  Clinically euthyroid Currently taking levothyroxine 200 mcg daily Check tsh  Titrate med dose if needed

## 2021-01-25 NOTE — Assessment & Plan Note (Signed)
Acute Experiencing bilateral leg swelling, 1+ edema on exam Kidney function, liver tests and lites were normal.  No shortness of breath or other symptoms or signs of heart failure He is obese, not exercising and the swelling is likely related to venous insufficiency He typically does wear compression socks, but has not been wearing them because of the heat-encouraged him to restart wearing them Advised elevating legs when sitting Encouraged weight loss CMP today Will get echocardiogram to evaluate diastolic function, EF

## 2021-01-25 NOTE — Assessment & Plan Note (Signed)
Chronic Stressed the importance of weight loss to help his joints, leg swelling, chronic medical problems and potentially get him off medication Stressed the importance of increasing exercise Discussed healthy diet and decreased portions Continue Ozempic 1 mg weekly, which has helped some-he has lost some weight

## 2021-02-01 ENCOUNTER — Encounter: Payer: Self-pay | Admitting: Gastroenterology

## 2021-02-07 ENCOUNTER — Other Ambulatory Visit: Payer: Self-pay | Admitting: Internal Medicine

## 2021-02-15 ENCOUNTER — Ambulatory Visit (HOSPITAL_COMMUNITY): Payer: BC Managed Care – PPO | Attending: Cardiology

## 2021-02-15 ENCOUNTER — Other Ambulatory Visit: Payer: Self-pay

## 2021-02-15 DIAGNOSIS — M7989 Other specified soft tissue disorders: Secondary | ICD-10-CM | POA: Insufficient documentation

## 2021-02-15 DIAGNOSIS — R600 Localized edema: Secondary | ICD-10-CM

## 2021-02-15 LAB — ECHOCARDIOGRAM COMPLETE
Area-P 1/2: 4.38 cm2
S' Lateral: 2.9 cm

## 2021-03-18 ENCOUNTER — Other Ambulatory Visit: Payer: Self-pay | Admitting: Internal Medicine

## 2021-03-21 ENCOUNTER — Ambulatory Visit: Payer: BC Managed Care – PPO | Admitting: Gastroenterology

## 2021-03-21 ENCOUNTER — Other Ambulatory Visit: Payer: BC Managed Care – PPO

## 2021-03-21 ENCOUNTER — Encounter: Payer: Self-pay | Admitting: Gastroenterology

## 2021-03-21 VITALS — BP 138/88 | HR 103 | Ht 72.0 in | Wt 338.0 lb

## 2021-03-21 DIAGNOSIS — Z86010 Personal history of colonic polyps: Secondary | ICD-10-CM | POA: Diagnosis not present

## 2021-03-21 DIAGNOSIS — K529 Noninfective gastroenteritis and colitis, unspecified: Secondary | ICD-10-CM

## 2021-03-21 DIAGNOSIS — R197 Diarrhea, unspecified: Secondary | ICD-10-CM | POA: Diagnosis not present

## 2021-03-21 MED ORDER — NA SULFATE-K SULFATE-MG SULF 17.5-3.13-1.6 GM/177ML PO SOLN: 1.0000 | Freq: Once | ORAL | 0 refills | Status: AC

## 2021-03-21 MED ORDER — PLENVU 140 G PO SOLR: 140.0000 g | ORAL | 0 refills | Status: DC

## 2021-03-21 NOTE — Progress Notes (Signed)
[facility] Consult Note:  History: Patrick Butler 03/21/2021  Referring provider: Pincus Sanes, MD  Reason for consult/chief complaint: Diarrhea (Watery stool x about a week, Deny N/V, abd cramping, started with alternate bowel habit constipation to diarrhea x 2 months) and Colonoscopy (Discuss colon)   Subjective  HPI: I saw Patrick Butler in May 2017 for a surveillance colonoscopy done at [facility].  A subcentimeter tubular adenoma was removed and fair MiraLAX preparation was noted.  (5-year recall recommended) Telemedicine appointment 07/31/2018 after episode of probable ischemic colitis leading to ED visit, may have been prompted by constipation and use of pseudoephedrine containing cold medication.  Stephanie tells me that for the last few months he has had sudden alternating constipation and diarrhea.  About 10 days ago he developed watery nonbloody stool with urgency.  He is also waking up several times at night with loose stool during that time.  No recent travel except for some occasional fishing tournaments, last being 10 to 12 days ago.  Appetite is good, weight remains stable, no nausea or vomiting.  He purposely lost a significant amount of weight since I last saw him, but in the last year it has been fairly steady.  No recent antibiotic use.  ROS:  Review of Systems  Constitutional:  Positive for fatigue. Negative for appetite change and unexpected weight change.  HENT:  Negative for mouth sores and voice change.   Eyes:  Negative for pain and redness.  Respiratory:  Negative for cough and shortness of breath.   Cardiovascular:  Negative for chest pain and palpitations.  Genitourinary:  Negative for dysuria and hematuria.  Musculoskeletal:  Positive for arthralgias. Negative for myalgias.  Skin:  Negative for pallor and rash.  Neurological:  Negative for weakness and headaches.  Hematological:  Negative for adenopathy.    Past Medical  History: Past Medical History:  Diagnosis Date   Anxiety    Arthritis    right knee   Cancer ([facility]) 2005   tonsils/Chemo and radiation   Dysphagia 08/2014   Dyspnea    with exertion   Full dentures    GERD (gastroesophageal reflux disease)    History of chemotherapy    History of radiation therapy    neck   Hyperlipidemia    Hypertension    states under control with med., has been on med. ~ 5 yr.   Hypothyroidism    Non-insulin dependent type 2 diabetes mellitus (HCC)    Posterior neck pain    Seasonal allergies    Sleep apnea    uses CPAP nightly   Chemoradiation treatment for tonsillar cancer in 2005.  Subsequent cervical esophageal stricture requiring periodic dilation performed by Dr. Ezzard Standing of ENT most recently in November 2021 Anesthesia note from that event: "Procedure Name: Intubation Date/Time: 03/11/2020 7:40 AM Performed by: Pearson Grippe, CRNA Pre-anesthesia Checklist: Patient identified, Emergency Drugs available, Suction available and Patient being monitored Patient Re-evaluated:Patient Re-evaluated prior to induction Oxygen Delivery Method: Circle system utilized Preoxygenation: Pre-oxygenation with 100% oxygen Induction Type: IV induction Ventilation: Mask ventilation without difficulty Laryngoscope Size: Miller and 2 Grade View: Grade I Tube type: Oral Tube size: 7.0 mm Number of attempts: 1 Airway Equipment and Method: Stylet and Oral airway Placement Confirmation: ETT inserted through vocal cords under direct vision,  positive ETCO2 and breath sounds checked- equal and bilateral"   Past Surgical History: Past Surgical History:  Procedure Laterality Date   COLONOSCOPY  2012   COLONOSCOPY WITH  PROPOFOL N/A 10/04/2015   Procedure: COLONOSCOPY WITH PROPOFOL;  Surgeon: Doran Stabler, MD;  [facility]: [facility];  Service: Gastroenterology;  Laterality: N/A;   DIRECT LARYNGOSCOPY N/A 08/20/2014   Procedure: DIRECT LARYNGOSCOPY;  Surgeon:  Rozetta Nunnery, MD;  Location: [place];  Service: ENT;  Laterality: N/A;   DIRECT LARYNGOSCOPY N/A 08/21/2016   Procedure: DIRECT LARYNGOSCOPY;  Surgeon: Rozetta Nunnery, MD;  Location: [place];  Service: ENT;  Laterality: N/A;   DIRECT LARYNGOSCOPY N/A 03/11/2020   Procedure: DIRECT LARYNGOSCOPY;  Surgeon: Rozetta Nunnery, MD;  Location: [place];  Service: ENT;  Laterality: N/A;   ESOPHAGEAL DILATION N/A 03/11/2020   Procedure: INSERTION OF GUIDE WIRE FOLLOWED BY DILATION OVER GUIDE WIRE;  Surgeon: Rozetta Nunnery, MD;  Location: [place];  Service: ENT;  Laterality: N/A;   ESOPHAGOSCOPY WITH DILITATION N/A 08/20/2014   Procedure: ESOPHAGOSCOPY WITH DILATATION;  Surgeon: Rozetta Nunnery, MD;  Location: [place];  Service: ENT;  Laterality: N/A;   ESOPHAGOSCOPY WITH DILITATION N/A 08/21/2016   Procedure: ESOPHAGOSCOPY WITH DILITATION;  Surgeon: Rozetta Nunnery, MD;  Location: [place];  Service: ENT;  Laterality: N/A;   KNEE ARTHROPLASTY Right    KNEE ARTHROSCOPY Right    x 3   KNEE ARTHROSCOPY Left 10/28/2015   Procedure: ARTHROSCOPY KNEE;  Surgeon: Dorna Leitz, MD;  Location: [place];  Service: Orthopedics;  Laterality: Left;  left knee partial medial meniscectomy and patellofemoral chondroplasty   LAPAROSCOPIC GASTRIC BYPASS  2004   LUMBAR LAMINECTOMY/DECOMPRESSION MICRODISCECTOMY  05/24/2011   Procedure: LUMBAR LAMINECTOMY/DECOMPRESSION MICRODISCECTOMY;  Surgeon: Winfield Cunas, MD;  Location: [place] [facility];  Service: Neurosurgery;  Laterality: Right;  RIGHT L5-S1 Laminectomy, discectomy   LYMPHADENECTOMY Right    cervical   MULTIPLE TOOTH EXTRACTIONS     PORT-A-CATH REMOVAL     PORTACATH PLACEMENT     REPLACEMENT TOTAL KNEE Right 03/22/2004   SHOULDER ARTHROSCOPY Left    SHOULDER OPEN ROTATOR CUFF REPAIR Right      Family History: Family History  Problem Relation Age of Onset   Colon cancer  Paternal Uncle    Heart attack Maternal Uncle         X 2; both pre 79   Hypertension Mother    Breast cancer Mother    Coronary artery disease Mother    Brain cancer Paternal Uncle    Diabetes Maternal Grandfather    Hypertension Maternal Grandfather        ? metastatic; ? primary   Diabetes Father    Stroke Father 83   Dementia Father     Social History: Social History   Socioeconomic History   Marital status: Married    Spouse name: [name]   Number of children: Not on file   Years of education: Not on file   Highest education level: Not on file  Occupational History   Occupation: Information systems manager: [employer]  Tobacco Use   Smoking status: Former    Packs/day: 0.00    Years: 25.00    Pack years: 0.00    Types: Cigarettes    Quit date: 10/25/2002    Years since quitting: 18.4   Smokeless tobacco: Never  Vaping Use   Vaping Use: Never used  Substance and Sexual Activity   Alcohol use: Not Currently    Alcohol/week: 3.0 - 4.0 standard drinks    Types: 3 - 4 Standard drinks  or equivalent per week    Comment: 08/17/16- " no desire in past 3- 4 months   Drug use: No   Sexual activity: Yes  Other Topics Concern   Not on file  Social History Narrative   Not on file   Social Determinants of Health   Financial Resource Strain: Not on file  Food Insecurity: Not on file  Transportation Needs: Not on file  Physical Activity: Not on file  Stress: Not on file  Social Connections: Not on file    Allergies: Allergies  Allergen Reactions   Sulfa Antibiotics Rash    Outpatient Meds: Current Outpatient Medications  Medication Sig Dispense Refill   amLODipine-benazepril (LOTREL) 10-40 MG capsule TAKE 1 CAPSULE BY MOUTH DAILY 90 capsule 1   aspirin EC 81 MG tablet Take 81 mg by mouth every evening.      cetirizine (ZYRTEC) 10 MG tablet Take 10 mg by mouth daily.     fluticasone (FLONASE) 50 MCG/ACT nasal spray Place 2 sprays into the nose as  directed. At bedtime (Patient taking differently: Place 2 sprays into the nose at bedtime.) 16 g 5   ketoconazole (NIZORAL) 2 % shampoo Apply 1 application topically 2 (two) times a week.     levothyroxine (SYNTHROID) 200 MCG tablet TAKE 1 TABLET BY MOUTH DAILY BEFORE BREAKFAST 90 tablet 1   LORazepam (ATIVAN) 0.5 MG tablet TAKE 1 TABLET BY MOUTH DAILY AS NEEDED FOR ANXIETY 30 tablet 0   metoprolol succinate (TOPROL-XL) 100 MG 24 hr tablet TAKE 1 TABLET BY MOUTH DAILY WITH A MEAL 90 tablet 1   metoprolol succinate (TOPROL-XL) 50 MG 24 hr tablet TAKE 1 TABLET BY MOUTH DAILY WITH $RemoveBe'100MG'[id number]$  TO TOTAL $Remove'150MG'QpqdSle$  DAILY 90 tablet 3   Multiple Vitamins-Minerals (MULTIVITAMIN WITH MINERALS) tablet Take 1 tablet by mouth daily.      Na Sulfate-K Sulfate-Mg Sulf 17.5-3.13-1.6 GM/177ML SOLN Take 1 kit by mouth once for 1 dose. 354 mL 0   pantoprazole (PROTONIX) 40 MG tablet TAKE 1 TABLET BY MOUTH DAILY 90 tablet 0   pravastatin (PRAVACHOL) 40 MG tablet TAKE 1 TABLET BY MOUTH DAILY 90 tablet 1   Semaglutide, 1 MG/DOSE, (OZEMPIC, 1 MG/DOSE,) 4 MG/3ML SOPN Inject 1 mg into the skin once a week. 9 mL 2   tadalafil (CIALIS) 5 MG tablet Take 5 mg by mouth daily.     tolterodine (DETROL LA) 4 MG 24 hr capsule Take 4 mg by mouth every evening.      No current facility-administered medications for this visit.    No new meds this year Has been on ozempic for "couple years"  ___________________________________________________________________ Objective   Exam:  BP 138/88   Pulse (!) 103   Ht 6' (1.829 m)   Wt (!) 338 lb (153.3 kg)   SpO2 97%   BMI 45.84 kg/m  Wt Readings from Last 3 Encounters:  03/21/21 (!) 338 lb (153.3 kg)  01/25/21 (!) 330 lb (149.7 kg)  07/25/20 (!) 335 lb (152 kg)    General: Well-appearing Eyes: sclera anicteric, no redness ENT: oral mucosa moist without lesions, no cervical or supraclavicular lymphadenopathy CV: RRR without murmur, S1/S2, no JVD, no peripheral edema Resp: clear to  auscultation bilaterally, normal RR and effort noted GI: soft, morbidly obese, no tenderness, with active bowel sounds. No guarding or palpable organomegaly noted.  (Limited by body habitus) Skin; warm and dry, no rash or jaundice noted Neuro: awake, alert and oriented x 3. Normal gross motor function and fluent  speech  Labs:  No recent data  Assessment: Encounter Diagnoses  Name Primary?   Hx of colonic polyps Yes   Chronic diarrhea     Few months of alternating bowel habits, about 10 days of loose watery stool.  Must rule out infection, could be early in the course of microscopic colitis. Due for surveillance colonoscopy as well  Plan:  GI pathogen panel this week  Colonoscopy.  He was agreeable after discussion of procedure and risks.  The benefits and risks of the planned procedure were described in detail with the patient or (when appropriate) their health care proxy.  Risks were outlined as including, but not limited to, bleeding, infection, perforation, adverse medication reaction leading to cardiac or pulmonary decompensation, pancreatitis (if ERCP).  The limitation of incomplete mucosal visualization was also discussed.  No guarantees or warranties were given.  Patient at increased risk for cardiopulmonary complications of procedure due to medical comorbidities. (Procedure booked out a few weeks to give time for stool study results)  Thank you for the courtesy of this consult.  Please call me with any questions or concerns.  [name]  CC: Referring provider noted above

## 2021-03-21 NOTE — Patient Instructions (Signed)
If you are age 62 or older, your body mass index should be between 23-30. Your Body mass index is 45.84 kg/m. If this is out of the aforementioned range listed, please consider follow up with your Primary Care Provider.  If you are age 58 or younger, your body mass index should be between 19-25. Your Body mass index is 45.84 kg/m. If this is out of the aformentioned range listed, please consider follow up with your Primary Care Provider.   ________________________________________________________  The [facility] providers would like to encourage you to use [facility] to communicate with providers for non-urgent requests or questions.  Due to long hold times on the telephone, sending your provider a message by [facility] may be a faster and more efficient way to get a response.  Please allow 48 business hours for a response.  Please remember that this is for non-urgent requests.  _______________________________________________________  Patrick Butler have been scheduled for a colonoscopy. Please follow written instructions given to you at your visit today.  Please pick up your prep supplies at the pharmacy within the next 1-3 days. If you use inhalers (even only as needed), please bring them with you on the day of your procedure.  Your provider has requested that you go to the basement level for lab work before leaving today. Press "B" on the elevator. The lab is located at the first door on the left as you exit the elevator.  Due to recent changes in healthcare laws, you may see the results of your imaging and laboratory studies on MyChart before your provider has had a chance to review them.  We understand that in some cases there may be results that are confusing or concerning to you. Not all laboratory results come back in the same time frame and the provider may be waiting for multiple results in order to interpret others.  Please give [place] 48 hours in order for your provider to thoroughly review all the results  before contacting the office for clarification of your results.    It was a pleasure to see you today!  Thank you for trusting me with your gastrointestinal care!

## 2021-03-21 NOTE — Addendum Note (Signed)
Addended by: Octavio Manns E on: 03/21/2021 10:32 AM   Modules accepted: Orders

## 2021-03-24 ENCOUNTER — Encounter: Payer: Self-pay | Admitting: Gastroenterology

## 2021-03-25 LAB — GI PROFILE, STOOL, PCR
Adenovirus F 40/41: NOT DETECTED
Astrovirus: NOT DETECTED
C difficile toxin A/B: NOT DETECTED
Campylobacter: NOT DETECTED
Cryptosporidium: NOT DETECTED
Cyclospora cayetanensis: NOT DETECTED
Entamoeba histolytica: NOT DETECTED
Enteroaggregative E coli: NOT DETECTED
Enteropathogenic E coli: DETECTED — AB
Enterotoxigenic E coli: NOT DETECTED
Giardia lamblia: NOT DETECTED
Norovirus GI/GII: DETECTED — AB
Plesiomonas shigelloides: NOT DETECTED
Rotavirus A: NOT DETECTED
Salmonella: NOT DETECTED
Sapovirus: NOT DETECTED
Shiga-toxin-producing E coli: NOT DETECTED
Shigella/Enteroinvasive E coli: NOT DETECTED
Vibrio cholerae: NOT DETECTED
Vibrio: NOT DETECTED
Yersinia enterocolitica: NOT DETECTED

## 2021-04-06 ENCOUNTER — Telehealth: Payer: Self-pay | Admitting: Pulmonary Disease

## 2021-04-06 DIAGNOSIS — G4733 Obstructive sleep apnea (adult) (pediatric): Secondary | ICD-10-CM

## 2021-04-07 ENCOUNTER — Other Ambulatory Visit: Payer: Self-pay | Admitting: Internal Medicine

## 2021-04-07 NOTE — Telephone Encounter (Signed)
Spoke with the pt  He is traveling to [place] in June 2022 and would like to buy travel CPAP online  He states that he is needing written rx and he would like to pick this up  Please advise if you are okay with this, thanks!

## 2021-04-11 NOTE — Telephone Encounter (Signed)
Rx done and placed up front for pt and I spoke with him and made aware this was done. Nothing further needed.

## 2021-05-03 ENCOUNTER — Encounter: Payer: Self-pay | Admitting: Gastroenterology

## 2021-05-11 ENCOUNTER — Ambulatory Visit (AMBULATORY_SURGERY_CENTER): Payer: BC Managed Care – PPO | Admitting: Gastroenterology

## 2021-05-11 ENCOUNTER — Encounter: Payer: Self-pay | Admitting: Gastroenterology

## 2021-05-11 ENCOUNTER — Other Ambulatory Visit: Payer: Self-pay

## 2021-05-11 VITALS — BP 117/75 | HR 87 | Temp 98.0°F | Resp 31 | Ht 72.0 in | Wt 338.0 lb

## 2021-05-11 DIAGNOSIS — K5730 Diverticulosis of large intestine without perforation or abscess without bleeding: Secondary | ICD-10-CM | POA: Diagnosis not present

## 2021-05-11 DIAGNOSIS — K529 Noninfective gastroenteritis and colitis, unspecified: Secondary | ICD-10-CM | POA: Diagnosis not present

## 2021-05-11 DIAGNOSIS — D122 Benign neoplasm of ascending colon: Secondary | ICD-10-CM | POA: Diagnosis not present

## 2021-05-11 DIAGNOSIS — R197 Diarrhea, unspecified: Secondary | ICD-10-CM | POA: Diagnosis not present

## 2021-05-11 DIAGNOSIS — D124 Benign neoplasm of descending colon: Secondary | ICD-10-CM | POA: Diagnosis not present

## 2021-05-11 DIAGNOSIS — Z1211 Encounter for screening for malignant neoplasm of colon: Secondary | ICD-10-CM | POA: Diagnosis not present

## 2021-05-11 DIAGNOSIS — D123 Benign neoplasm of transverse colon: Secondary | ICD-10-CM | POA: Diagnosis not present

## 2021-05-11 DIAGNOSIS — Z86010 Personal history of colonic polyps: Secondary | ICD-10-CM

## 2021-05-11 DIAGNOSIS — D120 Benign neoplasm of cecum: Secondary | ICD-10-CM | POA: Diagnosis not present

## 2021-05-11 MED ORDER — SODIUM CHLORIDE 0.9 % IV SOLN: 500.0000 mL | Freq: Once | INTRAVENOUS | Status: DC

## 2021-05-11 NOTE — Progress Notes (Signed)
Called to room to assist during endoscopic procedure.  Patient ID and intended procedure confirmed with present staff. Received instructions for my participation in the procedure from the performing physician.  

## 2021-05-11 NOTE — Progress Notes (Signed)
Vital-CW   History reviewed.

## 2021-05-11 NOTE — Progress Notes (Signed)
History and Physical:  This patient presents for endoscopic testing for: Encounter Diagnoses  Name Primary?   Hx of colonic polyps Yes   Chronic diarrhea     03/21/21 clinic visit. Diarrhea has significantly subsided, with "a few" episodes lasting 2-3 days since then.  No rectal bleeding.  ROS: Patient denies chest pain or cough   Past Medical History: Past Medical History:  Diagnosis Date   Anxiety    Arthritis    right knee   Cancer ([place]) 2005   tonsils/Chemo and radiation   Dysphagia 08/2014   Dyspnea    with exertion   Full dentures    GERD (gastroesophageal reflux disease)    History of chemotherapy    History of radiation therapy    neck   Hyperlipidemia    Hypertension    states under control with med., has been on med. ~ 5 yr.   Hypothyroidism    Non-insulin dependent type 2 diabetes mellitus (HCC)    Posterior neck pain    Seasonal allergies    Sleep apnea    uses CPAP nightly     Past Surgical History: Past Surgical History:  Procedure Laterality Date   COLONOSCOPY  2012   COLONOSCOPY WITH PROPOFOL N/A 10/04/2015   Procedure: COLONOSCOPY WITH PROPOFOL;  Surgeon: Doran Stabler, MD;  Location: [facility];  Service: Gastroenterology;  Laterality: N/A;   DIRECT LARYNGOSCOPY N/A 08/20/2014   Procedure: DIRECT LARYNGOSCOPY;  Surgeon: Rozetta Nunnery, MD;  Location: [place];  Service: ENT;  Laterality: N/A;   DIRECT LARYNGOSCOPY N/A 08/21/2016   Procedure: DIRECT LARYNGOSCOPY;  Surgeon: Rozetta Nunnery, MD;  Location: [place];  Service: ENT;  Laterality: N/A;   DIRECT LARYNGOSCOPY N/A 03/11/2020   Procedure: DIRECT LARYNGOSCOPY;  Surgeon: Rozetta Nunnery, MD;  Location: [place];  Service: ENT;  Laterality: N/A;   ESOPHAGEAL DILATION N/A 03/11/2020   Procedure: INSERTION OF GUIDE WIRE FOLLOWED BY DILATION OVER GUIDE WIRE;  Surgeon: Rozetta Nunnery, MD;  Location: [place];  Service: ENT;   Laterality: N/A;   ESOPHAGOSCOPY WITH DILITATION N/A 08/20/2014   Procedure: ESOPHAGOSCOPY WITH DILATATION;  Surgeon: Rozetta Nunnery, MD;  Location: [place];  Service: ENT;  Laterality: N/A;   ESOPHAGOSCOPY WITH DILITATION N/A 08/21/2016   Procedure: ESOPHAGOSCOPY WITH DILITATION;  Surgeon: Rozetta Nunnery, MD;  Location: [place];  Service: ENT;  Laterality: N/A;   KNEE ARTHROPLASTY Right    KNEE ARTHROSCOPY Right    x 3   KNEE ARTHROSCOPY Left 10/28/2015   Procedure: ARTHROSCOPY KNEE;  Surgeon: Dorna Leitz, MD;  Location: [place];  Service: Orthopedics;  Laterality: Left;  left knee partial medial meniscectomy and patellofemoral chondroplasty   LAPAROSCOPIC GASTRIC BYPASS  2004   LUMBAR LAMINECTOMY/DECOMPRESSION MICRODISCECTOMY  05/24/2011   Procedure: LUMBAR LAMINECTOMY/DECOMPRESSION MICRODISCECTOMY;  Surgeon: Winfield Cunas, MD;  Location: [place] [facility];  Service: Neurosurgery;  Laterality: Right;  RIGHT L5-S1 Laminectomy, discectomy   LYMPHADENECTOMY Right    cervical   MULTIPLE TOOTH EXTRACTIONS     PORT-A-CATH REMOVAL     PORTACATH PLACEMENT     REPLACEMENT TOTAL KNEE Right 03/22/2004   SHOULDER ARTHROSCOPY Left    SHOULDER OPEN ROTATOR CUFF REPAIR Right     Allergies: Allergies  Allergen Reactions   Sulfa Antibiotics Rash    Outpatient Meds: Current Outpatient Medications  Medication Sig Dispense Refill   amLODipine-benazepril (LOTREL) 10-40 MG capsule TAKE 1 CAPSULE BY  MOUTH DAILY 90 capsule 1   aspirin EC 81 MG tablet Take 81 mg by mouth every evening.      cetirizine (ZYRTEC) 10 MG tablet Take 10 mg by mouth daily.     fluticasone (FLONASE) 50 MCG/ACT nasal spray Place 2 sprays into the nose as directed. At bedtime (Patient taking differently: Place 2 sprays into the nose at bedtime.) 16 g 5   levothyroxine (SYNTHROID) 200 MCG tablet TAKE 1 TABLET BY MOUTH DAILY BEFORE BREAKFAST 90 tablet 1   metoprolol succinate (TOPROL-XL) 100 MG 24 hr tablet TAKE  1 TABLET BY MOUTH DAILY WITH A MEAL 90 tablet 1   metoprolol succinate (TOPROL-XL) 50 MG 24 hr tablet TAKE 1 TABLET BY MOUTH DAILY WITH 100MG  TO TOTAL 150MG  DAILY 90 tablet 3   pantoprazole (PROTONIX) 40 MG tablet TAKE 1 TABLET BY MOUTH DAILY 90 tablet 0   pravastatin (PRAVACHOL) 40 MG tablet TAKE 1 TABLET BY MOUTH DAILY 90 tablet 1   Semaglutide, 1 MG/DOSE, (OZEMPIC, 1 MG/DOSE,) 4 MG/3ML SOPN Inject 1 mg into the skin once a week. 9 mL 2   tadalafil (CIALIS) 5 MG tablet Take 5 mg by mouth daily.     tolterodine (DETROL LA) 4 MG 24 hr capsule Take 4 mg by mouth every evening.      ketoconazole (NIZORAL) 2 % shampoo Apply 1 application topically 2 (two) times a week.     LORazepam (ATIVAN) 0.5 MG tablet TAKE 1 TABLET BY MOUTH DAILY AS NEEDED FOR ANXIETY 30 tablet 0   Multiple Vitamins-Minerals (MULTIVITAMIN WITH MINERALS) tablet Take 1 tablet by mouth daily.      Current Facility-Administered Medications  Medication Dose Route Frequency Provider Last Rate Last Admin   0.9 %  sodium chloride infusion  500 mL Intravenous Once Danis, Kirke Corin, MD          ___________________________________________________________________ Objective   Exam:  BP (!) 152/72 (BP Location: Right Arm, Patient Position: Sitting, Cuff Size: Large)    Pulse 98    Temp 98 F (36.7 C) (Temporal)    Ht 6' (1.829 m)    Wt (!) 338 lb (153.3 kg)    SpO2 96%    BMI 45.84 kg/m   CV: RRR without murmur, S1/S2 Resp: clear to auscultation bilaterally, normal RR and effort noted GI: soft, no tenderness, with active bowel sounds.   Assessment: Encounter Diagnoses  Name Primary?   Hx of colonic polyps Yes   Chronic diarrhea      Plan: Colonoscopy with Bx for microscopic colitis.    The patient is appropriate for an endoscopic procedure in the ambulatory setting.   - Wilfrid Lund, MD

## 2021-05-11 NOTE — Progress Notes (Signed)
To pacu, VSS. Report to [email or web address] 

## 2021-05-11 NOTE — Op Note (Signed)
[facility] Patient Name: Patrick Butler Procedure Date: 05/11/2021 10:42 AM MRN: [id number] Endoscopist: [place]. Danis , MD Age: 61 Referring MD:  Date of Birth: Nov 26, 1960 Gender: Male Account #: [id number] Procedure:                Colonoscopy Indications:              Surveillance: Personal history of adenomatous                            polyps on last colonoscopy > 5 years ago, (TA <                            36mm x 1 in 09/2015 - fair prep on that exam)                           Incidental diarrhea noted (was positive for                            norovirus and EPEC, significantly improved but not                            resolved) Medicines:                Monitored Anesthesia Care Procedure:                Pre-Anesthesia Assessment:                           - Prior to the procedure, a History and Physical                            was performed, and patient medications and                            allergies were reviewed. The patient's tolerance of                            previous anesthesia was also reviewed. The risks                            and benefits of the procedure and the sedation                            options and risks were discussed with the patient.                            All questions were answered, and informed consent                            was obtained. Prior Anticoagulants: The patient has                            taken no previous anticoagulant or antiplatelet  agents. ASA Grade Assessment: III - A patient with                            severe systemic disease. After reviewing the risks                            and benefits, the patient was deemed in                            satisfactory condition to undergo the procedure.                           After obtaining informed consent, the colonoscope                            was passed under direct vision. Throughout the                             procedure, the patient's blood pressure, pulse, and                            oxygen saturations were monitored continuously. The                            Olympus CF-HQ190L [id number]) Colonoscope was                            introduced through the anus and advanced to the the                            terminal ileum, with identification of the                            appendiceal orifice and IC valve. The colonoscopy                            was performed without difficulty. The patient                            tolerated the procedure well. The quality of the                            bowel preparation was good after lavage. The                            terminal ileum, ileocecal valve, appendiceal                            orifice, and rectum were photographed. The bowel                            preparation used was SUPREP. Scope In: 10:57:01 AM Scope Out: 11:24:14 AM Scope Withdrawal Time: 0 hours 23 minutes 2 seconds  Total Procedure Duration: 0  hours 27 minutes 13 seconds  Findings:                 The perianal and digital rectal examinations were                            normal.                           The terminal ileum appeared normal.                           Two 3 to 10 mm sessile polyps were found in the                            cecum. The polyps were removed with a cold snare.                            Resection and retrieval were complete.                           Normal mucosa was found in the entire colon.                            Biopsies for histology were taken with a cold                            forceps from the right colon and left colon for                            evaluation of microscopic colitis.                           Diverticula were found in the right colon.                           Five sessile polyps were found in the ascending                            colon. The polyps were 3 to 5 mm in size. These                             polyps were removed with a cold snare. Resection                            and retrieval were complete.                           Three sessile polyps were found in the ascending                            colon. The polyps were 4 to 6 mm in size. These  polyps were removed with a cold snare. Resection                            and retrieval were complete.                           Internal hemorrhoids were found.                           Two sessile polyps were found in the descending                            colon and transverse colon. The polyps were 4 to 6                            mm in size. These polyps were removed with a cold                            snare. Resection and retrieval were complete.                           The exam was otherwise without abnormality on                            direct and retroflexion views. Complications:            No immediate complications. Estimated Blood Loss:     Estimated blood loss was minimal. Estimated blood                            loss was minimal. Impression:               - The examined portion of the ileum was normal.                           - Two 3 to 10 mm polyps in the cecum, removed with                            a cold snare. Resected and retrieved.                           - Normal mucosa in the entire examined colon.                            Biopsied.                           - Diverticulosis in the right colon.                           - Five 3 to 5 mm polyps in the ascending colon,                            removed with a cold snare. Resected and retrieved.                           -  Three 4 to 6 mm polyps in the ascending colon,                            removed with a cold snare. Resected and retrieved.                           - Two 4 to 6 mm polyps in the descending colon and                            in the transverse colon, removed with a cold snare.                             Resected and retrieved.                           - Internal hemorrhoids Recommendation:           - Patient has a contact number available for                            emergencies. The signs and symptoms of potential                            delayed complications were discussed with the                            patient. Return to normal activities tomorrow.                            Written discharge instructions were provided to the                            patient.                           - Resume previous diet.                           - Continue present medications.                           - Await pathology results.                           - Repeat colonoscopy in 1 year for surveillance. Islah Eve L. Loletha Carrow, MD 05/11/2021 11:35:13 AM This report has been signed electronically.

## 2021-05-11 NOTE — Patient Instructions (Signed)
Await pathology- next colonoscopy 1 year  Please read over handouts about polyps, diverticulosis and hemorrhoids  Continue your normal medications  YOU HAD AN ENDOSCOPIC PROCEDURE TODAY AT [facility]:   Refer to the procedure report that was given to you for any specific questions about what was found during the examination.  If the procedure report does not answer your questions, please call your gastroenterologist to clarify.  If you requested that your care partner not be given the details of your procedure findings, then the procedure report has been included in a sealed envelope for you to review at your convenience later.  YOU SHOULD EXPECT: Some feelings of bloating in the abdomen. Passage of more gas than usual.  Walking can help get rid of the air that was put into your GI tract during the procedure and reduce the bloating. If you had a lower endoscopy (such as a colonoscopy or flexible sigmoidoscopy) you may notice spotting of blood in your stool or on the toilet paper. If you underwent a bowel prep for your procedure, you may not have a normal bowel movement for a few days.  Please Note:  You might notice some irritation and congestion in your nose or some drainage.  This is from the oxygen used during your procedure.  There is no need for concern and it should clear up in a day or so.  SYMPTOMS TO REPORT IMMEDIATELY:  Following lower endoscopy (colonoscopy or flexible sigmoidoscopy):  Excessive amounts of blood in the stool  Significant tenderness or worsening of abdominal pains  Swelling of the abdomen that is new, acute  Fever of 100F or higher  For urgent or emergent issues, a gastroenterologist can be reached at any hour by calling [phone]. Do not use MyChart messaging for urgent concerns.    DIET:  We do recommend a small meal at first, but then you may proceed to your regular diet.  Drink plenty of fluids but you should avoid alcoholic beverages for  24 hours.  ACTIVITY:  You should plan to take it easy for the rest of today and you should NOT DRIVE or use heavy machinery until tomorrow (because of the sedation medicines used during the test).    FOLLOW UP: Our staff will call the number listed on your records 48-72 hours following your procedure to check on you and address any questions or concerns that you may have regarding the information given to you following your procedure. If we do not reach you, we will leave a message.  We will attempt to reach you two times.  During this call, we will ask if you have developed any symptoms of COVID 19. If you develop any symptoms (ie: fever, flu-like symptoms, shortness of breath, cough etc.) before then, please call [phone].  If you test positive for Covid 19 in the 2 weeks post procedure, please call and report this information to [place].    If any biopsies were taken you will be contacted by phone or by letter within the next 1-3 weeks.  Please call us at [phone] if you have not heard about the biopsies in 3 weeks.    SIGNATURES/CONFIDENTIALITY: You and/or your care partner have signed paperwork which will be entered into your electronic medical record.  These signatures attest to the fact that that the information above on your After Visit Summary has been reviewed and is understood.  Full responsibility of the confidentiality of this discharge information lies with you and/or  your care-partner.

## 2021-05-15 ENCOUNTER — Encounter: Payer: Self-pay | Admitting: Gastroenterology

## 2021-05-15 ENCOUNTER — Telehealth: Payer: Self-pay

## 2021-05-15 DIAGNOSIS — R21 Rash and other nonspecific skin eruption: Secondary | ICD-10-CM | POA: Diagnosis not present

## 2021-05-15 DIAGNOSIS — L820 Inflamed seborrheic keratosis: Secondary | ICD-10-CM | POA: Diagnosis not present

## 2021-05-15 DIAGNOSIS — L218 Other seborrheic dermatitis: Secondary | ICD-10-CM | POA: Diagnosis not present

## 2021-05-15 NOTE — Telephone Encounter (Signed)
°  Follow up Call-  Call back number 05/11/2021  Post procedure Call Back phone  # [phone]  Permission to leave phone message Yes  Some recent data might be hidden     Patient questions:  Do you have a fever, pain , or abdominal swelling? No. Pain Score  0 *  Have you tolerated food without any problems? Yes.    Have you been able to return to your normal activities? Yes.    Do you have any questions about your discharge instructions: Diet   No. Medications  No. Follow up visit  No.  Do you have questions or concerns about your Care? No.  Actions: * If pain score is 4 or above: No action needed, pain <4.

## 2021-05-25 DIAGNOSIS — Z125 Encounter for screening for malignant neoplasm of prostate: Secondary | ICD-10-CM | POA: Diagnosis not present

## 2021-05-31 DIAGNOSIS — N3281 Overactive bladder: Secondary | ICD-10-CM | POA: Diagnosis not present

## 2021-05-31 DIAGNOSIS — Z125 Encounter for screening for malignant neoplasm of prostate: Secondary | ICD-10-CM | POA: Diagnosis not present

## 2021-05-31 DIAGNOSIS — N5201 Erectile dysfunction due to arterial insufficiency: Secondary | ICD-10-CM | POA: Diagnosis not present

## 2021-06-06 DIAGNOSIS — G4733 Obstructive sleep apnea (adult) (pediatric): Secondary | ICD-10-CM | POA: Diagnosis not present

## 2021-06-07 ENCOUNTER — Telehealth: Payer: Self-pay | Admitting: Pulmonary Disease

## 2021-06-07 NOTE — Telephone Encounter (Signed)
Called and spoke with Andee Poles, [facility].  [place], [facility] stated Dr. Elsworth Soho signed CMN 06/06/21 and she faxed it.  Danielle stated she could not find CMN at this time, but would fax another CMN to be signed by Dr. Elsworth Soho.

## 2021-06-14 ENCOUNTER — Other Ambulatory Visit: Payer: Self-pay | Admitting: Internal Medicine

## 2021-07-17 ENCOUNTER — Other Ambulatory Visit: Payer: Self-pay | Admitting: Internal Medicine

## 2021-07-21 ENCOUNTER — Other Ambulatory Visit: Payer: Self-pay | Admitting: Internal Medicine

## 2021-07-25 NOTE — Patient Instructions (Addendum)
? ? ?  Shingles vaccine #1 today ? ? ?Blood work was ordered.   ? ? ?Medications changes include :   None ? ? ? ?Return in about 6 months (around 01/26/2022) for follow up. ? ?

## 2021-07-25 NOTE — Progress Notes (Signed)
? ? ? ? ?Subjective:  ? ? Patient ID: Patrick Butler, male    DOB: 1961-02-16, 61 y.o.   MRN: [id number] ? ?This visit occurred during the SARS-CoV-2 public health emergency.  Safety protocols were in place, including screening questions prior to the visit, additional usage of staff PPE, and extensive cleaning of exam room while observing appropriate contact time as indicated for disinfecting solutions.   ? ? ?HPI ?Kristina is here for follow up of his chronic medical problems, including htn, DM, hld, hypothyroidism, GERD, anxiety ? ?No exercise.     ? ?Medications and allergies reviewed with patient and updated if appropriate. ? ?Current Outpatient Medications on File Prior to Visit  ?Medication Sig Dispense Refill  ? amLODipine-benazepril (LOTREL) 10-40 MG capsule TAKE 1 CAPSULE BY MOUTH DAILY 90 capsule 1  ? aspirin EC 81 MG tablet Take 81 mg by mouth every evening.     ? cetirizine (ZYRTEC) 10 MG tablet Take 10 mg by mouth daily.    ? fluticasone (FLONASE) 50 MCG/ACT nasal spray Place 2 sprays into the nose as directed. At bedtime (Patient taking differently: Place 2 sprays into the nose at bedtime.) 16 g 5  ? ketoconazole (NIZORAL) 2 % shampoo Apply 1 application topically 2 (two) times a week.    ? levothyroxine (SYNTHROID) 200 MCG tablet TAKE 1 TABLET BY MOUTH DAILY BEFORE BREAKFAST 90 tablet 1  ? LORazepam (ATIVAN) 0.5 MG tablet TAKE 1 TABLET BY MOUTH DAILY AS NEEDED FOR ANXIETY 30 tablet 0  ? metoprolol succinate (TOPROL-XL) 100 MG 24 hr tablet TAKE 1 TABLET BY MOUTH DAILY WITH A MEAL 90 tablet 1  ? metoprolol succinate (TOPROL-XL) 50 MG 24 hr tablet TAKE 1 TABLET BY MOUTH DAILY WITH '100MG'$  TO TOTAL '150MG'$  DAILY 90 tablet 3  ? Multiple Vitamins-Minerals (MULTIVITAMIN WITH MINERALS) tablet Take 1 tablet by mouth daily.     ? pantoprazole (PROTONIX) 40 MG tablet TAKE 1 TABLET BY MOUTH DAILY 90 tablet 0  ? pravastatin (PRAVACHOL) 40 MG tablet TAKE 1 TABLET BY MOUTH DAILY 90 tablet 1  ? Semaglutide, 1 MG/DOSE,  (OZEMPIC, 1 MG/DOSE,) 4 MG/3ML SOPN Inject 1 mg into the skin once a week. 9 mL 2  ? tadalafil (CIALIS) 5 MG tablet Take 5 mg by mouth daily.    ? tolterodine (DETROL LA) 4 MG 24 hr capsule Take 4 mg by mouth every evening.     ? ?No current facility-administered medications on file prior to visit.  ? ? ? ?Review of Systems  ?Constitutional:  Negative for fever.  ?Respiratory:  Negative for cough, shortness of breath and wheezing.   ?Cardiovascular:  Positive for leg swelling. Negative for chest pain and palpitations.  ?Neurological:  Positive for headaches (more aura - not always pain). Negative for light-headedness.  ? ?   ?Objective:  ? ?Vitals:  ? 07/26/21 0930  ?BP: 130/80  ?Pulse: 85  ?Temp: 98.4 ?F (36.9 ?C)  ?SpO2: 98%  ? ?BP Readings from Last 3 Encounters:  ?07/26/21 130/80  ?05/11/21 117/75  ?03/21/21 138/88  ? ?Wt Readings from Last 3 Encounters:  ?07/26/21 (!) 337 lb (152.9 kg)  ?05/11/21 (!) 338 lb (153.3 kg)  ?03/21/21 (!) 338 lb (153.3 kg)  ? ?Body mass index is 45.71 kg/m?. ? ?  ?Physical Exam ?Constitutional:   ?   General: He is not in acute distress. ?   Appearance: Normal appearance. He is not ill-appearing.  ?HENT:  ?   Head: Normocephalic and atraumatic.  ?  Eyes:  ?   Conjunctiva/sclera: Conjunctivae normal.  ?Cardiovascular:  ?   Rate and Rhythm: Normal rate and regular rhythm.  ?   Heart sounds: Normal heart sounds. No murmur heard. ?Pulmonary:  ?   Effort: Pulmonary effort is normal. No respiratory distress.  ?   Breath sounds: Normal breath sounds. No wheezing or rales.  ?Musculoskeletal:  ?   Right lower leg: No edema.  ?   Left lower leg: No edema.  ?Skin: ?   General: Skin is warm and dry.  ?   Findings: No rash.  ?Neurological:  ?   Mental Status: He is alert. Mental status is at baseline.  ?Psychiatric:     ?   Mood and Affect: Mood normal.  ? ?   ? ?Lab Results  ?Component Value Date  ? WBC 8.6 01/25/2021  ? HGB 14.2 01/25/2021  ? HCT 43.2 01/25/2021  ? PLT 184.0 01/25/2021  ? GLUCOSE  101 (H) 01/25/2021  ? CHOL 124 01/25/2021  ? TRIG 172.0 (H) 01/25/2021  ? HDL 37.10 (L) 01/25/2021  ? LDLDIRECT 105.0 07/13/2015  ? [place] 53 01/25/2021  ? ALT 13 01/25/2021  ? AST 14 01/25/2021  ? NA 138 01/25/2021  ? K 4.5 01/25/2021  ? CL 103 01/25/2021  ? CREATININE 1.19 01/25/2021  ? BUN 25 (H) 01/25/2021  ? CO2 28 01/25/2021  ? TSH 1.00 01/25/2021  ? PSA 0.46 01/21/2015  ? HGBA1C 6.0 01/25/2021  ? MICROALBUR 1.2 01/13/2016  ? ? ? ?Assessment & Plan:  ? ? ?See Problem List for Assessment and Plan of chronic medical problems.  ? ? ?

## 2021-07-26 ENCOUNTER — Ambulatory Visit: Payer: BC Managed Care – PPO | Admitting: Internal Medicine

## 2021-07-26 ENCOUNTER — Encounter: Payer: Self-pay | Admitting: Internal Medicine

## 2021-07-26 ENCOUNTER — Other Ambulatory Visit: Payer: Self-pay

## 2021-07-26 VITALS — BP 130/80 | HR 85 | Temp 98.4°F | Ht 72.0 in | Wt 337.0 lb

## 2021-07-26 DIAGNOSIS — Z23 Encounter for immunization: Secondary | ICD-10-CM | POA: Diagnosis not present

## 2021-07-26 DIAGNOSIS — E7849 Other hyperlipidemia: Secondary | ICD-10-CM | POA: Diagnosis not present

## 2021-07-26 DIAGNOSIS — I10 Essential (primary) hypertension: Secondary | ICD-10-CM | POA: Diagnosis not present

## 2021-07-26 DIAGNOSIS — K219 Gastro-esophageal reflux disease without esophagitis: Secondary | ICD-10-CM | POA: Diagnosis not present

## 2021-07-26 DIAGNOSIS — F419 Anxiety disorder, unspecified: Secondary | ICD-10-CM

## 2021-07-26 DIAGNOSIS — E039 Hypothyroidism, unspecified: Secondary | ICD-10-CM | POA: Diagnosis not present

## 2021-07-26 DIAGNOSIS — E1141 Type 2 diabetes mellitus with diabetic mononeuropathy: Secondary | ICD-10-CM

## 2021-07-26 LAB — COMPREHENSIVE METABOLIC PANEL
ALT: 15 U/L (ref 0–53)
AST: 16 U/L (ref 0–37)
Albumin: 4.3 g/dL (ref 3.5–5.2)
Alkaline Phosphatase: 63 U/L (ref 39–117)
BUN: 25 mg/dL — ABNORMAL HIGH (ref 6–23)
CO2: 29 mEq/L (ref 19–32)
Calcium: 9.5 mg/dL (ref 8.4–10.5)
Chloride: 102 mEq/L (ref 96–112)
Creatinine, Ser: 1.23 mg/dL (ref 0.40–1.50)
GFR: 62.81 mL/min (ref >60.00)
Glucose, Bld: 107 mg/dL — ABNORMAL HIGH (ref 70–99)
Potassium: 5 mEq/L (ref 3.5–5.1)
Sodium: 138 mEq/L (ref 135–145)
Total Bilirubin: 0.5 mg/dL (ref 0.2–1.2)
Total Protein: 7.7 g/dL (ref 6.0–8.3)

## 2021-07-26 LAB — LIPID PANEL
Cholesterol: 133 mg/dL (ref 0–200)
HDL: 42.7 mg/dL (ref >39.00)
LDL Cholesterol: 62 mg/dL (ref 0–99)
NonHDL: 90.29
Total CHOL/HDL Ratio: 3
Triglycerides: 139 mg/dL (ref 0.0–149.0)
VLDL: 27.8 mg/dL (ref 0.0–40.0)

## 2021-07-26 LAB — MICROALBUMIN / CREATININE URINE RATIO
Creatinine,U: 58.4 mg/dL
Microalb Creat Ratio: 1.2 mg/g (ref 0.0–30.0)
Microalb, Ur: <0.7 mg/dL (ref 0.0–1.9)

## 2021-07-26 LAB — HEMOGLOBIN A1C: Hgb A1c MFr Bld: 6.2 % (ref 4.6–6.5)

## 2021-07-26 LAB — TSH: TSH: 2.72 u[IU]/mL (ref 0.35–5.50)

## 2021-07-26 NOTE — Assessment & Plan Note (Signed)
Chronic Regular exercise and healthy diet encouraged Check lipid panel  Continue pravastatin 40 mg daily 

## 2021-07-26 NOTE — Assessment & Plan Note (Signed)
Chronic Blood pressure well controlled CMP Continue amlodipine-benazepril 10-40 mg daily, metoprolol XL 150 mg daily 

## 2021-07-26 NOTE — Assessment & Plan Note (Signed)
Chronic Controlled, Stable Continue lorazepam 0.5 mg daily as needed 

## 2021-07-26 NOTE — Assessment & Plan Note (Signed)
Chronic ?Lab Results  ?Component Value Date  ? HGBA1C 6.0 01/25/2021  ? ?Sugars well controlled ?Check A1c, urine microalbumin today ?Continue Ozempic 1 mg weekly ?Stressed regular exercise, diabetic diet ?Will adjust medication as needed ? ?

## 2021-07-26 NOTE — Assessment & Plan Note (Signed)
Chronic ?He is trying to lose weight ?Currently on Ozempic 1 mg weekly-advised him to try to decrease portions-discussed this medication should help him feel less hungry and make him feel full quicker ?Continue to be as active as possible ?Diabetic diet ?

## 2021-07-26 NOTE — Assessment & Plan Note (Signed)
Chronic  ?Clinically euthyroid ?Currently taking levothyroxine 200 mcg daily ?Check tsh  ?Titrate med dose if needed ?

## 2021-07-26 NOTE — Assessment & Plan Note (Signed)
Chronic GERD controlled Continue pantoprazole 40 mg daily 

## 2021-08-08 ENCOUNTER — Other Ambulatory Visit: Payer: Self-pay | Admitting: Internal Medicine

## 2021-09-13 ENCOUNTER — Other Ambulatory Visit: Payer: Self-pay | Admitting: Internal Medicine

## 2021-10-04 ENCOUNTER — Ambulatory Visit (INDEPENDENT_AMBULATORY_CARE_PROVIDER_SITE_OTHER): Payer: BC Managed Care – PPO

## 2021-10-04 DIAGNOSIS — Z23 Encounter for immunization: Secondary | ICD-10-CM | POA: Diagnosis not present

## 2021-10-06 ENCOUNTER — Encounter: Payer: Self-pay | Admitting: Internal Medicine

## 2021-10-09 ENCOUNTER — Other Ambulatory Visit: Payer: Self-pay | Admitting: Internal Medicine

## 2021-10-09 NOTE — Telephone Encounter (Signed)
Patient called back and is completely out of all 3 medications

## 2021-12-06 DIAGNOSIS — G4733 Obstructive sleep apnea (adult) (pediatric): Secondary | ICD-10-CM | POA: Diagnosis not present

## 2021-12-07 DIAGNOSIS — H524 Presbyopia: Secondary | ICD-10-CM | POA: Diagnosis not present

## 2021-12-07 DIAGNOSIS — E113291 Type 2 diabetes mellitus with mild nonproliferative diabetic retinopathy without macular edema, right eye: Secondary | ICD-10-CM | POA: Diagnosis not present

## 2021-12-07 DIAGNOSIS — H2513 Age-related nuclear cataract, bilateral: Secondary | ICD-10-CM | POA: Diagnosis not present

## 2021-12-07 DIAGNOSIS — H25013 Cortical age-related cataract, bilateral: Secondary | ICD-10-CM | POA: Diagnosis not present

## 2021-12-07 LAB — HM DIABETES EYE EXAM

## 2021-12-21 ENCOUNTER — Encounter: Payer: Self-pay | Admitting: Internal Medicine

## 2021-12-21 NOTE — Progress Notes (Signed)
Outside notes received. Information abstracted. Notes sent to scan.  

## 2022-01-12 ENCOUNTER — Other Ambulatory Visit: Payer: Self-pay | Admitting: Internal Medicine

## 2022-01-28 NOTE — Patient Instructions (Signed)
    Flu immunization administered today.     Blood work was ordered.     Medications changes include :   ozempic 2 mg weekly   Your prescription(s) have been sent to your pharmacy.      Return in about 6 months (around 07/31/2022) for follow up.

## 2022-01-28 NOTE — Progress Notes (Unsigned)
Subjective:    Patient ID: Patrick Butler, male    DOB: Jan 25, 1961, 61 y.o.   MRN: [id number]     HPI Patrick Butler is here for follow up of his chronic medical problems, including htn, DM, hld, hypothyroidism, GERD, anxiety  He tweeked his back about one month ago -- starting to get better now.   Otherwise  - has a lot of stress - does stress eat.   Grazes throughout day.   Medications and allergies reviewed with patient and updated if appropriate.  Current Outpatient Medications on File Prior to Visit  Medication Sig Dispense Refill   amLODipine-benazepril (LOTREL) 10-40 MG capsule TAKE 1 CAPSULE BY MOUTH DAILY 90 capsule 2   aspirin EC 81 MG tablet Take 81 mg by mouth every evening.      fluticasone (FLONASE) 50 MCG/ACT nasal spray Place 2 sprays into the nose as directed. At bedtime (Patient taking differently: Place 2 sprays into the nose at bedtime.) 16 g 5   ketoconazole (NIZORAL) 2 % shampoo Apply 1 application topically 2 (two) times a week.     levothyroxine (SYNTHROID) 200 MCG tablet TAKE 1 TABLET BY MOUTH DAILY BEFORE BREAKFAST 90 tablet 1   LORazepam (ATIVAN) 0.5 MG tablet TAKE 1 TABLET BY MOUTH DAILY AS NEEDED FOR ANXIETY 30 tablet 0   metoprolol succinate (TOPROL-XL) 100 MG 24 hr tablet TAKE 1 TABLET BY MOUTH DAILY WITH A MEAL 90 tablet 2   metoprolol succinate (TOPROL-XL) 50 MG 24 hr tablet TAKE 1 TABLET BY MOUTH DAILY WITH '100MG'$  TO TOTAL '150MG'$  DAILY 90 tablet 3   Multiple Vitamins-Minerals (MULTIVITAMIN WITH MINERALS) tablet Take 1 tablet by mouth daily.      OZEMPIC, 1 MG/DOSE, 4 MG/3ML SOPN INJECT 1 MG INTO THE SKIN ONCE WEEKLY 9 mL 2   pantoprazole (PROTONIX) 40 MG tablet TAKE 1 TABLET BY MOUTH DAILY 90 tablet 2   pravastatin (PRAVACHOL) 40 MG tablet TAKE 1 TABLET BY MOUTH DAILY 90 tablet 1   tadalafil (CIALIS) 5 MG tablet Take 5 mg by mouth daily.     tolterodine (DETROL LA) 4 MG 24 hr capsule Take 4 mg by mouth every evening.      No current facility-administered  medications on file prior to visit.     Review of Systems  Constitutional:  Negative for fever.  Respiratory:  Negative for cough, shortness of breath and wheezing.   Cardiovascular:  Positive for leg swelling (chronic - mild). Negative for chest pain and palpitations.  Neurological:  Positive for headaches. Negative for light-headedness.       Objective:   Vitals:   01/30/22 0909  BP: 136/84  Pulse: 93  Temp: 98.2 F (36.8 C)  SpO2: 97%   BP Readings from Last 3 Encounters:  01/30/22 136/84  07/26/21 130/80  05/11/21 117/75   Wt Readings from Last 3 Encounters:  01/30/22 (!) 337 lb (152.9 kg)  07/26/21 (!) 337 lb (152.9 kg)  05/11/21 (!) 338 lb (153.3 kg)   Body mass index is 45.71 kg/m.    Physical Exam Constitutional:      General: He is not in acute distress.    Appearance: Normal appearance. He is not ill-appearing.  HENT:     Head: Normocephalic and atraumatic.  Eyes:     Conjunctiva/sclera: Conjunctivae normal.  Cardiovascular:     Rate and Rhythm: Normal rate and regular rhythm.     Heart sounds: Normal heart sounds. No murmur heard. Pulmonary:  Effort: Pulmonary effort is normal. No respiratory distress.     Breath sounds: Normal breath sounds. No wheezing or rales.  Musculoskeletal:     Right lower leg: No edema.     Left lower leg: No edema.  Skin:    General: Skin is warm and dry.     Findings: No rash.  Neurological:     Mental Status: He is alert. Mental status is at baseline.  Psychiatric:        Mood and Affect: Mood normal.        Lab Results  Component Value Date   WBC 8.6 01/25/2021   HGB 14.2 01/25/2021   HCT 43.2 01/25/2021   PLT 184.0 01/25/2021   GLUCOSE 107 (H) 07/26/2021   CHOL 133 07/26/2021   TRIG 139.0 07/26/2021   HDL 42.70 07/26/2021   LDLDIRECT 105.0 07/13/2015   LDLCALC 62 07/26/2021   ALT 15 07/26/2021   AST 16 07/26/2021   NA 138 07/26/2021   K 5.0 07/26/2021   CL 102 07/26/2021   CREATININE 1.23  07/26/2021   BUN 25 (H) 07/26/2021   CO2 29 07/26/2021   TSH 2.72 07/26/2021   PSA 0.46 01/21/2015   HGBA1C 6.2 07/26/2021   MICROALBUR <0.7 07/26/2021     Assessment & Plan:    See Problem List for Assessment and Plan of chronic medical problems.

## 2022-01-30 ENCOUNTER — Ambulatory Visit: Payer: BC Managed Care – PPO | Admitting: Internal Medicine

## 2022-01-30 ENCOUNTER — Encounter: Payer: Self-pay | Admitting: Internal Medicine

## 2022-01-30 VITALS — BP 136/84 | HR 93 | Temp 98.2°F | Ht 72.0 in | Wt 337.0 lb

## 2022-01-30 DIAGNOSIS — E039 Hypothyroidism, unspecified: Secondary | ICD-10-CM

## 2022-01-30 DIAGNOSIS — I10 Essential (primary) hypertension: Secondary | ICD-10-CM | POA: Diagnosis not present

## 2022-01-30 DIAGNOSIS — Z23 Encounter for immunization: Secondary | ICD-10-CM

## 2022-01-30 DIAGNOSIS — E7849 Other hyperlipidemia: Secondary | ICD-10-CM | POA: Diagnosis not present

## 2022-01-30 DIAGNOSIS — K219 Gastro-esophageal reflux disease without esophagitis: Secondary | ICD-10-CM

## 2022-01-30 DIAGNOSIS — F419 Anxiety disorder, unspecified: Secondary | ICD-10-CM

## 2022-01-30 DIAGNOSIS — E1141 Type 2 diabetes mellitus with diabetic mononeuropathy: Secondary | ICD-10-CM

## 2022-01-30 LAB — CBC WITH DIFFERENTIAL/PLATELET
Basophils Absolute: 0 10*3/uL (ref 0.0–0.1)
Basophils Relative: 0.5 % (ref 0.0–3.0)
Eosinophils Absolute: 0.1 10*3/uL (ref 0.0–0.7)
Eosinophils Relative: 0.9 % (ref 0.0–5.0)
HCT: 42.3 % (ref 39.0–52.0)
Hemoglobin: 14.1 g/dL (ref 13.0–17.0)
Lymphocytes Relative: 41.8 % (ref 12.0–46.0)
Lymphs Abs: 3.6 10*3/uL (ref 0.7–4.0)
MCHC: 33.3 g/dL (ref 30.0–36.0)
MCV: 86.7 fl (ref 78.0–100.0)
Monocytes Absolute: 0.8 10*3/uL (ref 0.1–1.0)
Monocytes Relative: 8.8 % (ref 3.0–12.0)
Neutro Abs: 4.2 10*3/uL (ref 1.4–7.7)
Neutrophils Relative %: 48 % (ref 43.0–77.0)
Platelets: 187 10*3/uL (ref 150.0–400.0)
RBC: 4.88 Mil/uL (ref 4.22–5.81)
RDW: 14.9 % (ref 11.5–15.5)
WBC: 8.7 10*3/uL (ref 4.0–10.5)

## 2022-01-30 LAB — COMPREHENSIVE METABOLIC PANEL
ALT: 15 U/L (ref 0–53)
AST: 16 U/L (ref 0–37)
Albumin: 4.2 g/dL (ref 3.5–5.2)
Alkaline Phosphatase: 63 U/L (ref 39–117)
BUN: 17 mg/dL (ref 6–23)
CO2: 27 mEq/L (ref 19–32)
Calcium: 9.8 mg/dL (ref 8.4–10.5)
Chloride: 102 mEq/L (ref 96–112)
Creatinine, Ser: 1.22 mg/dL (ref 0.40–1.50)
GFR: 63.2 mL/min (ref >60.00)
Glucose, Bld: 110 mg/dL — ABNORMAL HIGH (ref 70–99)
Potassium: 4.5 mEq/L (ref 3.5–5.1)
Sodium: 137 mEq/L (ref 135–145)
Total Bilirubin: 0.6 mg/dL (ref 0.2–1.2)
Total Protein: 8 g/dL (ref 6.0–8.3)

## 2022-01-30 LAB — LIPID PANEL
Cholesterol: 132 mg/dL (ref 0–200)
HDL: 41 mg/dL (ref >39.00)
LDL Cholesterol: 59 mg/dL (ref 0–99)
NonHDL: 90.97
Total CHOL/HDL Ratio: 3
Triglycerides: 159 mg/dL — ABNORMAL HIGH (ref 0.0–149.0)
VLDL: 31.8 mg/dL (ref 0.0–40.0)

## 2022-01-30 LAB — HEMOGLOBIN A1C: Hgb A1c MFr Bld: 6.2 % (ref 4.6–6.5)

## 2022-01-30 LAB — TSH: TSH: 1.99 u[IU]/mL (ref 0.35–5.50)

## 2022-01-30 MED ORDER — SEMAGLUTIDE (2 MG/DOSE) 8 MG/3ML ~~LOC~~ SOPN: 2.0000 mg | PEN_INJECTOR | SUBCUTANEOUS | 1 refills | Status: DC

## 2022-01-30 NOTE — Assessment & Plan Note (Signed)
Chronic Regular exercise and healthy diet encouraged Check lipid panel  Continue pravastatin 40 mg daily 

## 2022-01-30 NOTE — Assessment & Plan Note (Signed)
Chronic Increase ozempic to 2 mg weekly - hopefully that will help some with the weight Decrease intake - he does not eat big meals but grazes throughout the day - discussed that he needs to stop that and work on better stress control since some of it is stress eating

## 2022-01-30 NOTE — Assessment & Plan Note (Signed)
Chronic Controlled, Stable Continue lorazepam 0.5 mg daily as needed, which is infrequent

## 2022-01-30 NOTE — Assessment & Plan Note (Signed)
Chronic Blood pressure well controlled CMP Continue amlodipine-benazepril 10-40 mg daily, metoprolol XL 150 mg daily

## 2022-01-30 NOTE — Assessment & Plan Note (Signed)
Chronic GERD controlled Continue pantoprazole 40 mg daily 

## 2022-01-30 NOTE — Assessment & Plan Note (Signed)
Chronic  Clinically euthyroid Check tsh and will titrate med dose if needed Currently taking levothyroxine 200 mcg daily 

## 2022-01-30 NOTE — Assessment & Plan Note (Addendum)
Chronic  Lab Results  Component Value Date   HGBA1C 6.2 07/26/2021   Sugars well controlled Check A1c Continue Ozempic, but increase to 2 mg weekly - hopefully that will help with weight loss Stressed regular exercise, diabetic diet

## 2022-02-02 ENCOUNTER — Other Ambulatory Visit: Payer: Self-pay | Admitting: Internal Medicine

## 2022-03-24 ENCOUNTER — Encounter: Payer: Self-pay | Admitting: Internal Medicine

## 2022-03-26 ENCOUNTER — Encounter: Payer: Self-pay | Admitting: Internal Medicine

## 2022-03-26 ENCOUNTER — Telehealth: Payer: BC Managed Care – PPO | Admitting: Internal Medicine

## 2022-03-26 DIAGNOSIS — U071 COVID-19: Secondary | ICD-10-CM | POA: Diagnosis not present

## 2022-03-26 NOTE — Progress Notes (Signed)
Virtual Visit via Video Note  I connected with Patrick Butler on 03/26/22 at  2:40 PM EST by a video enabled telemedicine application and verified that I am speaking with the correct person using two identifiers.   I discussed the limitations of evaluation and management by telemedicine and the availability of in person appointments. The patient expressed understanding and agreed to proceed.  Present for the visit:  Myself, Dr Billey Gosling, [name].  The patient is currently at home and I am in the office.    No referring provider.    History of Present Illness: This visit is an acute visit for COVID.  His symptoms started 5 days ago and he tested at home and was positive for COVID.  He does state that his symptoms are better today.  He states chills, sweats and fatigue.  He has had congestion, cough that was productive, but seems to be more dry now.  He has had some diarrhea, body aches and headaches and dizziness on a couple of occasions.  He has been taking NyQuil.  He does feel better today   Review of Systems  Constitutional:  Positive for chills, diaphoresis and malaise/fatigue. Negative for fever.  HENT:  Positive for congestion. Negative for ear pain, sinus pain and sore throat.   Respiratory:  Positive for cough. Negative for shortness of breath and wheezing.   Gastrointestinal:  Positive for diarrhea.  Musculoskeletal:  Positive for myalgias.  Neurological:  Positive for dizziness (a couple) and headaches.      Social History   Socioeconomic History   Marital status: Married    Spouse name: Patrick Butler   Number of children: Not on file   Years of education: Not on file   Highest education level: Not on file  Occupational History   Occupation: Information systems manager: [employer]  Tobacco Use   Smoking status: Former    Packs/day: 0.00    Years: 25.00    Total pack years: 0.00    Types: Cigarettes    Quit date: 10/25/2002    Years since quitting: 19.4    Smokeless tobacco: Never  Vaping Use   Vaping Use: Never used  Substance and Sexual Activity   Alcohol use: Not Currently    Alcohol/week: 3.0 - 4.0 standard drinks of alcohol    Types: 3 - 4 Standard drinks or equivalent per week    Comment: 08/17/16- " no desire in past 3- 4 months   Drug use: No   Sexual activity: Yes  Other Topics Concern   Not on file  Social History Narrative   Not on file   Social Determinants of Health   Financial Resource Strain: Not on file  Food Insecurity: Not on file  Transportation Needs: Not on file  Physical Activity: Not on file  Stress: Not on file  Social Connections: Not on file     Observations/Objective: Appears well in NAD Breathing normally and speaking in full sentences Skin appears warm and dry  Assessment and Plan:  See Problem List for Assessment and Plan of chronic medical problems.   Follow Up Instructions:    I discussed the assessment and treatment plan with the patient. The patient was provided an opportunity to ask questions and all were answered. The patient agreed with the plan and demonstrated an understanding of the instructions.   The patient was advised to call back or seek an in-person evaluation if the symptoms worsen or if the condition  fails to improve as anticipated.    Binnie Rail, MD

## 2022-03-26 NOTE — Assessment & Plan Note (Signed)
Acute Today is day 5 of symptoms He does feel better today and not sure if he needs any treatment Discussed taking an antiviral, but he defers for now Continue symptomatic treatment-NyQuil at night, over-the-counter cold or allergy medications as needed Stressed increase rest and fluids If his symptoms do not improve, worsen or if he has any questions he will call

## 2022-04-02 DIAGNOSIS — R0981 Nasal congestion: Secondary | ICD-10-CM | POA: Diagnosis not present

## 2022-04-02 DIAGNOSIS — M25512 Pain in left shoulder: Secondary | ICD-10-CM | POA: Diagnosis not present

## 2022-04-02 DIAGNOSIS — M25511 Pain in right shoulder: Secondary | ICD-10-CM | POA: Diagnosis not present

## 2022-04-02 DIAGNOSIS — J069 Acute upper respiratory infection, unspecified: Secondary | ICD-10-CM | POA: Diagnosis not present

## 2022-04-02 DIAGNOSIS — R051 Acute cough: Secondary | ICD-10-CM | POA: Diagnosis not present

## 2022-04-11 DIAGNOSIS — M75101 Unspecified rotator cuff tear or rupture of right shoulder, not specified as traumatic: Secondary | ICD-10-CM | POA: Diagnosis not present

## 2022-04-11 DIAGNOSIS — R6889 Other general symptoms and signs: Secondary | ICD-10-CM | POA: Diagnosis not present

## 2022-04-11 DIAGNOSIS — R29898 Other symptoms and signs involving the musculoskeletal system: Secondary | ICD-10-CM | POA: Diagnosis not present

## 2022-04-11 DIAGNOSIS — M25511 Pain in right shoulder: Secondary | ICD-10-CM | POA: Diagnosis not present

## 2022-05-02 DIAGNOSIS — N10 Acute tubulo-interstitial nephritis: Secondary | ICD-10-CM | POA: Diagnosis not present

## 2022-05-03 DIAGNOSIS — N10 Acute tubulo-interstitial nephritis: Secondary | ICD-10-CM | POA: Diagnosis not present

## 2022-05-09 DIAGNOSIS — N3281 Overactive bladder: Secondary | ICD-10-CM | POA: Diagnosis not present

## 2022-05-09 DIAGNOSIS — N5201 Erectile dysfunction due to arterial insufficiency: Secondary | ICD-10-CM | POA: Diagnosis not present

## 2022-05-09 DIAGNOSIS — N401 Enlarged prostate with lower urinary tract symptoms: Secondary | ICD-10-CM | POA: Diagnosis not present

## 2022-05-09 DIAGNOSIS — R3912 Poor urinary stream: Secondary | ICD-10-CM | POA: Diagnosis not present

## 2022-05-14 DIAGNOSIS — M75122 Complete rotator cuff tear or rupture of left shoulder, not specified as traumatic: Secondary | ICD-10-CM | POA: Diagnosis not present

## 2022-05-14 DIAGNOSIS — M75121 Complete rotator cuff tear or rupture of right shoulder, not specified as traumatic: Secondary | ICD-10-CM | POA: Diagnosis not present

## 2022-05-23 ENCOUNTER — Encounter: Payer: Self-pay | Admitting: Gastroenterology

## 2022-05-29 ENCOUNTER — Emergency Department (HOSPITAL_COMMUNITY): Payer: BC Managed Care – PPO

## 2022-05-29 ENCOUNTER — Other Ambulatory Visit: Payer: Self-pay

## 2022-05-29 ENCOUNTER — Emergency Department (HOSPITAL_COMMUNITY)
Admission: EM | Admit: 2022-05-29 | Discharge: 2022-05-29 | Disposition: A | Discharge status: Home / Self Care | Payer: BC Managed Care – PPO | Attending: Emergency Medicine | Admitting: Emergency Medicine

## 2022-05-29 DIAGNOSIS — Z85818 Personal history of malignant neoplasm of other sites of lip, oral cavity, and pharynx: Secondary | ICD-10-CM | POA: Diagnosis not present

## 2022-05-29 DIAGNOSIS — R000 Tachycardia, unspecified: Secondary | ICD-10-CM | POA: Diagnosis not present

## 2022-05-29 DIAGNOSIS — E119 Type 2 diabetes mellitus without complications: Secondary | ICD-10-CM | POA: Insufficient documentation

## 2022-05-29 DIAGNOSIS — R55 Syncope and collapse: Secondary | ICD-10-CM | POA: Diagnosis not present

## 2022-05-29 DIAGNOSIS — E039 Hypothyroidism, unspecified: Secondary | ICD-10-CM | POA: Diagnosis not present

## 2022-05-29 DIAGNOSIS — R600 Localized edema: Secondary | ICD-10-CM | POA: Diagnosis not present

## 2022-05-29 DIAGNOSIS — I10 Essential (primary) hypertension: Secondary | ICD-10-CM | POA: Diagnosis not present

## 2022-05-29 DIAGNOSIS — N281 Cyst of kidney, acquired: Secondary | ICD-10-CM | POA: Diagnosis not present

## 2022-05-29 DIAGNOSIS — K529 Noninfective gastroenteritis and colitis, unspecified: Secondary | ICD-10-CM | POA: Diagnosis not present

## 2022-05-29 DIAGNOSIS — R42 Dizziness and giddiness: Secondary | ICD-10-CM | POA: Diagnosis not present

## 2022-05-29 DIAGNOSIS — I499 Cardiac arrhythmia, unspecified: Secondary | ICD-10-CM | POA: Diagnosis not present

## 2022-05-29 DIAGNOSIS — I4510 Unspecified right bundle-branch block: Secondary | ICD-10-CM | POA: Diagnosis not present

## 2022-05-29 DIAGNOSIS — R079 Chest pain, unspecified: Secondary | ICD-10-CM | POA: Diagnosis not present

## 2022-05-29 DIAGNOSIS — R109 Unspecified abdominal pain: Secondary | ICD-10-CM

## 2022-05-29 LAB — CBC WITH DIFFERENTIAL/PLATELET
Abs Immature Granulocytes: 0.04 10*3/uL (ref 0.00–0.07)
Basophils Absolute: 0 10*3/uL (ref 0.0–0.1)
Basophils Relative: 0 %
Eosinophils Absolute: 0.1 10*3/uL (ref 0.0–0.5)
Eosinophils Relative: 1 %
HCT: 40.4 % (ref 39.0–52.0)
Hemoglobin: 13.1 g/dL (ref 13.0–17.0)
Immature Granulocytes: 0 %
Lymphocytes Relative: 19 %
Lymphs Abs: 1.9 10*3/uL (ref 0.7–4.0)
MCH: 28.5 pg (ref 26.0–34.0)
MCHC: 32.4 g/dL (ref 30.0–36.0)
MCV: 88 fL (ref 80.0–100.0)
Monocytes Absolute: 0.7 10*3/uL (ref 0.1–1.0)
Monocytes Relative: 7 %
Neutro Abs: 7.3 10*3/uL (ref 1.7–7.7)
Neutrophils Relative %: 73 %
Platelets: 132 10*3/uL — ABNORMAL LOW (ref 150–400)
RBC: 4.59 MIL/uL (ref 4.22–5.81)
RDW: 15.5 % (ref 11.5–15.5)
WBC: 10.1 10*3/uL (ref 4.0–10.5)
nRBC: 0 % (ref 0.0–0.2)

## 2022-05-29 LAB — HEPATIC FUNCTION PANEL
ALT: 10 U/L (ref 0–44)
AST: 21 U/L (ref 15–41)
Albumin: 2.8 g/dL — ABNORMAL LOW (ref 3.5–5.0)
Alkaline Phosphatase: 51 U/L (ref 38–126)
Bilirubin, Direct: 0.1 mg/dL (ref 0.0–0.2)
Indirect Bilirubin: 0.4 mg/dL (ref 0.3–0.9)
Total Bilirubin: 0.5 mg/dL (ref 0.3–1.2)
Total Protein: 5.9 g/dL — ABNORMAL LOW (ref 6.5–8.1)

## 2022-05-29 LAB — BASIC METABOLIC PANEL
Anion gap: 6 (ref 5–15)
BUN: 21 mg/dL (ref 8–23)
CO2: 22 mmol/L (ref 22–32)
Calcium: 7.8 mg/dL — ABNORMAL LOW (ref 8.9–10.3)
Chloride: 109 mmol/L (ref 98–111)
Creatinine, Ser: 1.22 mg/dL (ref 0.61–1.24)
GFR, Estimated: >60 mL/min (ref >60)
Glucose, Bld: 111 mg/dL — ABNORMAL HIGH (ref 70–99)
Potassium: 4 mmol/L (ref 3.5–5.1)
Sodium: 137 mmol/L (ref 135–145)

## 2022-05-29 LAB — TROPONIN I (HIGH SENSITIVITY)
Troponin I (High Sensitivity): 2 ng/L (ref <18)
Troponin I (High Sensitivity): <2 ng/L (ref <18)

## 2022-05-29 LAB — TSH: TSH: 0.852 u[IU]/mL (ref 0.350–4.500)

## 2022-05-29 LAB — LIPASE, BLOOD: Lipase: 40 U/L (ref 11–51)

## 2022-05-29 MED ORDER — SODIUM CHLORIDE 0.9 % IV BOLUS
500.0000 mL | Freq: Once | INTRAVENOUS | Status: AC
  Administered 2022-05-29: 500 mL via INTRAVENOUS

## 2022-05-29 MED ORDER — IOHEXOL 350 MG/ML SOLN
75.0000 mL | Freq: Once | INTRAVENOUS | Status: AC | PRN
  Administered 2022-05-29: 75 mL via INTRAVENOUS

## 2022-05-29 MED ORDER — ONDANSETRON HCL 4 MG PO TABS: 4.0000 mg | ORAL_TABLET | Freq: Three times a day (TID) | ORAL | 0 refills | Status: DC | PRN

## 2022-05-29 MED ORDER — AMOXICILLIN-POT CLAVULANATE 875-125 MG PO TABS: 1.0000 | ORAL_TABLET | Freq: Two times a day (BID) | ORAL | 0 refills | Status: AC

## 2022-05-29 MED ORDER — SODIUM CHLORIDE 0.9 % IV BOLUS
1000.0000 mL | Freq: Once | INTRAVENOUS | Status: AC
  Administered 2022-05-29: 1000 mL via INTRAVENOUS

## 2022-05-29 NOTE — Discharge Instructions (Signed)
Your history, exam, workup today revealed likely colitis causing your abdominal pain and GI symptoms.  I suspect this led to dehydration leading to the syncopal episodes today.  After fluids her blood pressure was greatly improved and you had resolution of the lightheadedness and passing out spells.  We do feel you are now safe for discharge home given your stability for over 7 hours here.  Your cardiac workup was otherwise reassuring.  Please follow-up with your primary doctor and take the antibiotics to help with the inflammation and infection.  Please use the nausea medicine to help maintain hydration and use over-the-counter medications to help with discomfort.  Please follow-up with your primary doctor as well and if symptoms persist, GI.  If any symptoms change or worsen acutely, please return to the nearest emergency department.

## 2022-05-29 NOTE — ED Triage Notes (Addendum)
EMS stated, on his way to bathroom and blacked over. He did that twice at his office today.  20 g in left AC given 500cc of fluid N/S

## 2022-05-29 NOTE — ED Provider Notes (Signed)
[facility] Provider Note   CSN: [id number] Arrival date & time: 05/29/22  9417     History  Chief Complaint  Patient presents with   Loss of Consciousness    Patrick Butler is a 62 y.o. male.  The history is provided by the patient and medical records. No language interpreter was used.  Loss of Consciousness Episode history:  Multiple Most recent episode:  Today Duration: several seconds per report. Timing:  Sporadic Progression:  Resolved Chronicity:  New Context: standing up   Witnessed: yes   Relieved by:  Nothing Worsened by:  Nothing Associated symptoms: diaphoresis, malaise/fatigue and nausea   Associated symptoms: no chest pain, no confusion, no difficulty breathing, no dizziness, no fever, no headaches, no palpitations, no recent fall, no recent injury, no recent surgery, no rectal bleeding, no shortness of breath, no vomiting and no weakness        Home Medications Prior to Admission medications   Medication Sig Start Date End Date Taking? Authorizing Provider  amLODipine-benazepril (LOTREL) 10-40 MG capsule TAKE 1 CAPSULE BY MOUTH DAILY 10/09/21   Binnie Rail, MD  aspirin EC 81 MG tablet Take 81 mg by mouth every evening.     [provider]  fluticasone (FLONASE) 50 MCG/ACT nasal spray Place 2 sprays into the nose as directed. At bedtime Patient taking differently: Place 2 sprays into the nose at bedtime. 11/20/11   Hendricks Limes, MD  ketoconazole (NIZORAL) 2 % shampoo Apply 1 application topically 2 (two) times a week. 07/04/18   [provider]  levothyroxine (SYNTHROID) 200 MCG tablet TAKE 1 TABLET BY MOUTH DAILY BEFORE BREAKFAST 01/12/22   Burns, Claudina Lick, MD  LORazepam (ATIVAN) 0.5 MG tablet TAKE 1 TABLET BY MOUTH DAILY AS NEEDED FOR ANXIETY 07/21/21   Burns, Claudina Lick, MD  metoprolol succinate (TOPROL-XL) 100 MG 24 hr tablet TAKE 1 TABLET BY MOUTH DAILY WITH A MEAL 10/09/21   Burns, Claudina Lick, MD   metoprolol succinate (TOPROL-XL) 50 MG 24 hr tablet TAKE 1 TABLET BY MOUTH DAILY WITH '100MG'$  TO TOTAL '150MG'$  DAILY 02/05/22   Binnie Rail, MD  Multiple Vitamins-Minerals (MULTIVITAMIN WITH MINERALS) tablet Take 1 tablet by mouth daily.     [provider]  pantoprazole (PROTONIX) 40 MG tablet TAKE 1 TABLET BY MOUTH DAILY 09/13/21   Binnie Rail, MD  pravastatin (PRAVACHOL) 40 MG tablet TAKE 1 TABLET BY MOUTH DAILY 02/05/22   Binnie Rail, MD  Semaglutide, 2 MG/DOSE, 8 MG/3ML SOPN Inject 2 mg as directed once a week. 01/30/22   Binnie Rail, MD  tadalafil (CIALIS) 5 MG tablet Take 5 mg by mouth daily. 06/03/20   [provider]  tolterodine (DETROL LA) 4 MG 24 hr capsule Take 4 mg by mouth every evening.     [provider]      Allergies    Sulfa antibiotics    Review of Systems   Review of Systems  Constitutional:  Positive for diaphoresis, fatigue and malaise/fatigue. Negative for chills and fever.  HENT:  Negative for congestion.   Eyes:  Negative for visual disturbance.  Respiratory:  Negative for cough, chest tightness and shortness of breath.   Cardiovascular:  Positive for syncope. Negative for chest pain and palpitations.  Gastrointestinal:  Positive for abdominal pain and nausea. Negative for constipation, diarrhea and vomiting.  Genitourinary:  Negative for dysuria, flank pain and frequency.  Musculoskeletal:  Negative for back  pain, neck pain and neck stiffness.  Skin:  Negative for rash and wound.  Neurological:  Positive for syncope and light-headedness. Negative for dizziness, facial asymmetry, weakness, numbness and headaches.  Psychiatric/Behavioral:  Negative for agitation and confusion.   All other systems reviewed and are negative.   Physical Exam Updated Vital Signs BP 121/79 (BP Location: Right Arm)   Pulse 100   Temp 98.2 F (36.8 C) (Oral)   Resp 18   Ht 6' (1.829 m)   Wt (!) 147.4 kg   SpO2 96%   BMI 44.08 kg/m  Physical  Exam Vitals and nursing note reviewed.  Constitutional:      General: He is not in acute distress.    Appearance: He is well-developed. He is not ill-appearing, toxic-appearing or diaphoretic.  HENT:     Head: Normocephalic and atraumatic.     Nose: Nose normal.     Mouth/Throat:     Pharynx: No oropharyngeal exudate or posterior oropharyngeal erythema.  Eyes:     Extraocular Movements: Extraocular movements intact.     Conjunctiva/sclera: Conjunctivae normal.     Pupils: Pupils are equal, round, and reactive to light.  Cardiovascular:     Rate and Rhythm: Regular rhythm. Tachycardia present.     Heart sounds: No murmur heard. Pulmonary:     Effort: Pulmonary effort is normal. No respiratory distress.     Breath sounds: Normal breath sounds. No wheezing, rhonchi or rales.  Chest:     Chest wall: No tenderness.  Abdominal:     General: Abdomen is flat.     Palpations: Abdomen is soft.     Tenderness: There is no abdominal tenderness. There is no right CVA tenderness, left CVA tenderness, guarding or rebound.  Musculoskeletal:        General: No swelling or tenderness.     Cervical back: Neck supple. No tenderness.     Right lower leg: Edema present.     Left lower leg: Edema present.  Skin:    General: Skin is warm and dry.     Capillary Refill: Capillary refill takes less than 2 seconds.     Coloration: Skin is not pale.     Findings: No erythema, lesion or rash.  Neurological:     General: No focal deficit present.     Mental Status: He is alert.     Sensory: No sensory deficit.     Motor: No weakness.  Psychiatric:        Mood and Affect: Mood normal.     ED Results / Procedures / Treatments   Labs (all labs ordered are listed, but only abnormal results are displayed) Labs Reviewed  CBC WITH DIFFERENTIAL/PLATELET - Abnormal; Notable for the following components:      Result Value   Platelets 132 (*)    All other components within normal limits  BASIC METABOLIC  PANEL - Abnormal; Notable for the following components:   Glucose, Bld 111 (*)    Calcium 7.8 (*)    All other components within normal limits  HEPATIC FUNCTION PANEL - Abnormal; Notable for the following components:   Total Protein 5.9 (*)    Albumin 2.8 (*)    All other components within normal limits  LIPASE, BLOOD  TSH  URINALYSIS, ROUTINE W REFLEX MICROSCOPIC  TROPONIN I (HIGH SENSITIVITY)  TROPONIN I (HIGH SENSITIVITY)    EKG EKG Interpretation  Date/Time:  Tuesday May 29 2022 08:35:06 EST Ventricular Rate:  102 PR Interval:  190  QRS Duration: 112 QT Interval:  354 QTC Calculation: 461 R Axis:   -25 Text Interpretation: Sinus tachycardia Right bundle branch block Inferior infarct , age undetermined Anterolateral infarct , age undetermined Abnormal ECG When compared with ECG of 08-Mar-2020 08:36, PREVIOUS ECG IS PRESENT when compared to prior, overall similar appearance with faster rate. No sTEMI Confirmed by Antony Blackbird [phone]) on 05/29/2022 8:49:25 AM  Radiology CT ABDOMEN PELVIS W CONTRAST  Result Date: 05/29/2022 CLINICAL DATA:  Abdominal pain, acute, nonlocalized. EXAM: CT ABDOMEN AND PELVIS WITH CONTRAST TECHNIQUE: Multidetector CT imaging of the abdomen and pelvis was performed using the standard protocol following bolus administration of intravenous contrast. RADIATION DOSE REDUCTION: This exam was performed according to the departmental dose-optimization program which includes automated exposure control, adjustment of the mA and/or kV according to patient size and/or use of iterative reconstruction technique. CONTRAST:  80m OMNIPAQUE IOHEXOL 350 MG/ML SOLN COMPARISON:  07/13/2018 FINDINGS: Lower chest: Lung bases are clear.  No pleural or pericardial fluid. Hepatobiliary: Liver parenchyma is normal without contrast. No calcified gallstones or evidence of ductal dilatation. Pancreas: Normal Spleen: Normal Adrenals/Urinary Tract: Stable subcentimeter low-density  adrenal nodules on both sides consistent with benign adenomas. No further follow-up recommended. The left kidney is normal. The right kidney is normal except for a simple appearing cyst in the lower pole measuring 2.3 cm. No follow-up recommended. The bladder is normal. Stomach/Bowel: Previous gastric surgery. No complicating feature seen. Small bowel is normal. No visible appendix. There is mild wall thickening of the splenic flexure and proximal descending colon consistent with inflammatory colitis. This is less pronounced than was seen in M2020-03-22 Vascular/Lymphatic: Aortic atherosclerosis. No aneurysm. IVC is normal. No adenopathy. Reproductive: Normal Other: No free fluid or air. Musculoskeletal: Ordinary mild lower lumbar degenerative changes. IMPRESSION: 1. Mild wall thickening of the splenic flexure and proximal descending colon consistent with inflammatory colitis. This is less pronounced than was seen in M03/22/20 2. Previous gastric surgery. No complicating feature seen. 3. Aortic atherosclerosis. Aortic Atherosclerosis (ICD10-I70.0). Electronically Signed   By: MNelson ChimesM.D.   On: 05/29/2022 11:18   DG Chest 2 View  Result Date: 05/29/2022 CLINICAL DATA:  62year old male with abdominal pain, subsequent syncope and fall. EXAM: CHEST - 2 VIEW COMPARISON:  Chest CT 01/22/2017 and earlier. FINDINGS: Seated AP and lateral views of the chest at 0910 hours. Lung volumes and mediastinal contours remain within normal limits. Visualized tracheal air column is within normal limits. Chronic left lateral rib fractures which were acute in 2Mar 22, 2018 Both lungs appear clear. No pneumothorax or pleural effusion. Flowing thoracic endplate osteophytes along with the chronic left rib fractures. No acute osseous abnormality identified. Paucity of bowel gas the visible abdomen. IMPRESSION: 1. No acute cardiopulmonary abnormality. 2. Chronic left rib fractures. Electronically Signed   By: HGenevie AnnM.D.   On:  05/29/2022 09:23    Procedures Procedures    Medications Ordered in ED Medications  sodium chloride 0.9 % bolus 500 mL (0 mLs Intravenous Stopped 05/29/22 1014)  iohexol (OMNIPAQUE) 350 MG/ML injection 75 mL (75 mLs Intravenous Contrast Given 05/29/22 1111)  sodium chloride 0.9 % bolus 1,000 mL (0 mLs Intravenous Stopped 05/29/22 1306)    ED Course/ Medical Decision Making/ A&P                             Medical Decision Making Amount and/or Complexity of Data Reviewed Labs: ordered. Radiology: ordered.  Risk Prescription drug management.    PAU BANH is a 62 y.o. male with a past medical history significant for hypertension, hyperlipidemia, hypothyroidism, previous gastric bypass surgery, diabetes, GERD, previous esophageal dilations, previous tonsil cancer, anxiety, arthritis, and sleep apnea who presents for abdominal pain and recurrent syncope.  According to patient, he was at the office today and was having abdominal pain.  It was flaring up to 9 out of 10.  He reports he still try to go to the bathroom as he felt the urge to have bowel movement and then had a syncopal episode.  He reports that everything went dark and passed out.  He denies chest pain, palpitation, or shortness of breath prior to the fall but did say he was diaphoretic.  He then was out for several seconds before waking back up.  He then try to get to the bathroom and passed out a second time.  He does report he had some lightheadedness with standing intermittently and he was on a new medication with a side effect of lightheadedness for his urologist.  He said that he has had no urinary changes and no dysuria.  No fevers, chills, congestion, or cough reported.  No chest pain, palpitations, shortness of breath.  Reports the abdominal pain is improved now but was up to 9 out of 10 in severity.  He reports it did not go straight to his back.  Denies any other neurologic symptoms and no focal deficits were  reported.  Patient was given some fluids with EMS and he is starting to feel better.  On my exam, lungs were clear and chest was nontender.  Abdomen was nontender but he was describing diffuse pain earlier.  Bowel sounds were appreciated.  Flanks and back nontender.  No focal neurologic deficits.  He does have some edema in the legs he reports is unchanged from baseline.  Good pulses in extremities.  EKG did not show STEMI.  Clinically patient did appear he is slightly dehydrated with dry mucous membranes.  Will give some more fluids.  His heart rate was around 100.  We will get screening labs but due to the amount of abdominal pain he had, will get a CT abdomen pelvis to rule out diverticulitis colitis, or other acute abnormality.  Given the pain not being a stabbing pain going from his chest or abdomen to his back have low suspicion for an aortic etiology of symptoms we will hold on CT at this time.  If workup reassuring and he is feeling better after fluids and he does not have lightheadedness or near syncope, suspect he may be stable for discharge home and outpatient follow-up.  Anticipate reassessment after workup.  Patient was able to ambulate to the bathroom without significant lightheadedness or near syncope after the fluids.  He is feeling better and heart rate has improved and blood pressure has improved.  His CT scan did show evidence of some colitis.  Due to the amount of discomfort and findings on exam and imaging, we will give him some antibiotics for this colitis.  Patient also give prescription for nausea medicine and will follow-up with his PCP.  We feel he is now safe for discharge home and patient agrees.  Patient discharged in stable condition.        Final Clinical Impression(s) / ED Diagnoses Final diagnoses:  Syncope, unspecified syncope type  Near syncope  Abdominal pain, unspecified abdominal location  Colitis    Rx / DC Orders ED Discharge  Orders           Ordered    amoxicillin-clavulanate (AUGMENTIN) 875-125 MG tablet  2 times daily        05/29/22 1523    ondansetron (ZOFRAN) 4 MG tablet  Every 8 hours PRN        05/29/22 1523            Clinical Impression: 1. Syncope, unspecified syncope type   2. Near syncope   3. Abdominal pain, unspecified abdominal location   4. Colitis     Disposition: Discharge  Condition: Good  I have discussed the results, Dx and Tx plan with the pt(& family if present). He/she/they expressed understanding and agree(s) with the plan. Discharge instructions discussed at great length. Strict return precautions discussed and pt &/or family have verbalized understanding of the instructions. No further questions at time of discharge.    New Prescriptions   AMOXICILLIN-CLAVULANATE (AUGMENTIN) 875-125 MG TABLET    Take 1 tablet by mouth 2 (two) times daily for 10 days.   ONDANSETRON (ZOFRAN) 4 MG TABLET    Take 1 tablet (4 mg total) by mouth every 8 (eight) hours as needed for nausea or vomiting.    Follow Up: Binnie Rail, MD [place] [address] [facility] [place] [address] [address] [facility] [place] [address] [phone] Schedule an appointment as soon as possible for a visit    [facility] Emergency Department at [facility] [address] [place]       Darcey Cardy, Gwenyth Allegra, MD 05/29/22 1525

## 2022-05-29 NOTE — ED Provider Triage Note (Signed)
Emergency Medicine Provider Triage Evaluation Note  Patrick Butler , a 62 y.o. male  was evaluated in triage.  Pt complains of syncopal episode.  States he was working on his computer when he had some abdominal cramping which is not unusual for him due to his colitis.  He got up to go to the bathroom but had a syncopal episode preceded by seeing bright lights feeling dizzy and lightheaded and falling to his knees.  Does not think he hit his head.  No chest pain or shortness of breath.  Complains of right knee pain from the fall.  Did pass out again when he tried to get up to go to the bathroom.  No diarrhea or vomiting.  Was told by his urologist one of his medications may make him pass out..  Review of Systems  Positive: Syncope, abdominal pain Negative: Chest pain, shortness of breath  Physical Exam  BP 121/79 (BP Location: Right Arm)   Pulse 100   Temp 98.2 F (36.8 C) (Oral)   Resp 18   Ht 6' (1.829 m)   Wt (!) 147.4 kg   SpO2 96%   BMI 44.08 kg/m  Gen:   Awake, no distress   Resp:  Normal effort  MSK:   Moves extremities without difficulty  Other:    Medical Decision Making  Medically screening exam initiated at 8:45 AM.  Appropriate orders placed.  Patrick Butler was informed that the remainder of the evaluation will be completed by another provider, this initial triage assessment does not replace that evaluation, and the importance of remaining in the ED until their evaluation is complete.  Syncope x 2.  Abdominal cramping preceded this.  No chest pain or shortness of breath   Ezequiel Essex, MD 05/29/22 [phone]

## 2022-05-29 NOTE — ED Triage Notes (Addendum)
Pt. Stated, I felt a cramp in my stomach and got up to go to bathroom and I guess went down and I did it again when I got to the bathroom. I'm having some pain on right knee, which its a knee replacement knee in 2005. I feel fine now.. My urologist changed my medication a month ago and he said it might make my BP drop.

## 2022-05-30 DIAGNOSIS — M25561 Pain in right knee: Secondary | ICD-10-CM | POA: Diagnosis not present

## 2022-05-30 DIAGNOSIS — S8001XA Contusion of right knee, initial encounter: Secondary | ICD-10-CM | POA: Diagnosis not present

## 2022-05-30 DIAGNOSIS — Z96651 Presence of right artificial knee joint: Secondary | ICD-10-CM | POA: Diagnosis not present

## 2022-05-31 ENCOUNTER — Encounter: Payer: Self-pay | Admitting: Gastroenterology

## 2022-05-31 ENCOUNTER — Encounter: Payer: Self-pay | Admitting: Internal Medicine

## 2022-05-31 NOTE — Progress Notes (Signed)
Subjective:    Patient ID: Patrick Butler, male    DOB: [birth year], 62 y.o.   MRN: [id number]     HPI Patrick Butler is here for follow up from the hospital.    Ed 1/23 for LOC.  He was at work earlier that day and was having abdominal pain.  The pain reached an intensity of 9/10.  He went to the bathroom and had a syncopal episode.  There was LOC.  He denied chest pain, palpitation, shortness of the prior to the fall, but states he was diaphoretic.  He thinks he was out several seconds before waking back up.  He then tried to get to the bathroom and passed out a second time.  He did have lightheadedness with standing intermittently and he was on a new medication from his urologist that had a side effect from lightheadedness.  No urinary symptoms.  No fever, chills, cold symptoms.  No chest pain, palpitations or shortness of breath.  Abdominal pain has improved by the time he got to the emergency room.  No neurological symptoms.  Received IVF from EMS and started to feel better.'s exam was normal.  EKG without change.  He did appear slightly dehydrated.  Blood work, CT of the abdomen and pelvis done.  CT with mild wall thickening of splenic flexure and proximal desc colon c/w inflammatory colitis. He received a prescription for nausea and antibiotics.   Abdominal pain-he does have intermittent abdominal pain.  He notes that sometimes it comes when he does not have a bm for a couple of days and then it comes out hard and then is much after that.    BP has been dropping.  He is currently on Flomax and Vesicare from urology and he thinks that may be causing the blood pressure drop he does get lightheaded at times - SBP less than 100.  His blood pressure is always higher here than at home.  Medications and allergies reviewed with patient and updated if appropriate.  Current Outpatient Medications on File Prior to Visit  Medication Sig Dispense Refill   amLODipine-benazepril (LOTREL) 10-40 MG capsule  TAKE 1 CAPSULE BY MOUTH DAILY 90 capsule 2   amoxicillin-clavulanate (AUGMENTIN) 875-125 MG tablet Take 1 tablet by mouth 2 (two) times daily for 10 days. 20 tablet 0   aspirin EC 81 MG tablet Take 81 mg by mouth every evening.      cetirizine (ZYRTEC) 10 MG tablet Take 1 tablet by mouth daily.     fluticasone (FLONASE) 50 MCG/ACT nasal spray Place 2 sprays into the nose as directed. At bedtime (Patient taking differently: Place 2 sprays into the nose at bedtime.) 16 g 5   ketoconazole (NIZORAL) 2 % shampoo Apply 1 application topically 2 (two) times a week.     levothyroxine (SYNTHROID) 200 MCG tablet TAKE 1 TABLET BY MOUTH DAILY BEFORE BREAKFAST 90 tablet 1   LORazepam (ATIVAN) 0.5 MG tablet TAKE 1 TABLET BY MOUTH DAILY AS NEEDED FOR ANXIETY 30 tablet 0   metoprolol succinate (TOPROL-XL) 100 MG 24 hr tablet TAKE 1 TABLET BY MOUTH DAILY WITH A MEAL 90 tablet 2   metoprolol succinate (TOPROL-XL) 50 MG 24 hr tablet TAKE 1 TABLET BY MOUTH DAILY WITH '100MG'$  TO TOTAL '150MG'$  DAILY 90 tablet 3   Multiple Vitamins-Minerals (MULTIVITAMIN WITH MINERALS) tablet Take 1 tablet by mouth daily.      ondansetron (ZOFRAN) 4 MG tablet Take 1 tablet (4 mg total) by mouth every  8 (eight) hours as needed for nausea or vomiting. 12 tablet 0   pantoprazole (PROTONIX) 40 MG tablet TAKE 1 TABLET BY MOUTH DAILY 90 tablet 2   pravastatin (PRAVACHOL) 40 MG tablet TAKE 1 TABLET BY MOUTH DAILY 90 tablet 3   Semaglutide, 2 MG/DOSE, 8 MG/3ML SOPN Inject 2 mg as directed once a week. 9 mL 1   solifenacin (VESICARE) 10 MG tablet      tadalafil (CIALIS) 5 MG tablet Take 5 mg by mouth daily.     tamsulosin (FLOMAX) 0.4 MG CAPS capsule      No current facility-administered medications on file prior to visit.     Review of Systems  Constitutional:  Negative for chills, diaphoresis and fever.  Respiratory:  Negative for cough, shortness of breath and wheezing.   Cardiovascular:  Positive for leg swelling (mild). Negative for  chest pain and palpitations.  Gastrointestinal:  Positive for constipation. Negative for abdominal pain, blood in stool, diarrhea and nausea.  Neurological:  Positive for light-headedness and headaches (migraines).       Objective:   Vitals:   06/01/22 1447  BP: 130/80  Pulse: 97  Temp: 98.1 F (36.7 C)  SpO2: 97%   BP Readings from Last 3 Encounters:  06/01/22 130/80  05/29/22 116/76  01/30/22 136/84   Wt Readings from Last 3 Encounters:  06/01/22 (!) 338 lb (153.3 kg)  05/29/22 (!) 325 lb (147.4 kg)  01/30/22 (!) 337 lb (152.9 kg)   Body mass index is 45.84 kg/m.    Physical Exam Constitutional:      General: He is not in acute distress.    Appearance: Normal appearance. He is not ill-appearing.  HENT:     Head: Normocephalic and atraumatic.  Eyes:     Conjunctiva/sclera: Conjunctivae normal.  Cardiovascular:     Rate and Rhythm: Normal rate and regular rhythm.     Heart sounds: Normal heart sounds. No murmur heard. Pulmonary:     Effort: Pulmonary effort is normal. No respiratory distress.     Breath sounds: Normal breath sounds. No wheezing or rales.  Abdominal:     General: There is no distension.     Palpations: Abdomen is soft.     Tenderness: There is no abdominal tenderness.  Musculoskeletal:     Right lower leg: No edema.     Left lower leg: No edema.  Skin:    General: Skin is warm and dry.     Findings: No rash.  Neurological:     Mental Status: He is alert. Mental status is at baseline.  Psychiatric:        Mood and Affect: Mood normal.        Lab Results  Component Value Date   WBC 10.1 05/29/2022   HGB 13.1 05/29/2022   HCT 40.4 05/29/2022   PLT 132 (L) 05/29/2022   GLUCOSE 111 (H) 05/29/2022   CHOL 132 01/30/2022   TRIG 159.0 (H) 01/30/2022   HDL 41.00 01/30/2022   LDLDIRECT 105.0 07/13/2015   LDLCALC 59 01/30/2022   ALT 10 05/29/2022   AST 21 05/29/2022   NA 137 05/29/2022   K 4.0 05/29/2022   CL 109 05/29/2022    CREATININE 1.22 05/29/2022   BUN 21 05/29/2022   CO2 22 05/29/2022   TSH 0.852 05/29/2022   PSA 0.46 01/21/2015   HGBA1C 6.2 01/30/2022   MICROALBUR <0.7 07/26/2021     Assessment & Plan:    See Problem List for Assessment and  Plan of chronic medical problems.

## 2022-06-01 ENCOUNTER — Ambulatory Visit: Payer: BC Managed Care – PPO | Admitting: Internal Medicine

## 2022-06-01 ENCOUNTER — Encounter: Payer: Self-pay | Admitting: Internal Medicine

## 2022-06-01 VITALS — BP 130/80 | HR 97 | Temp 98.1°F | Ht 72.0 in | Wt 338.0 lb

## 2022-06-01 DIAGNOSIS — I10 Essential (primary) hypertension: Secondary | ICD-10-CM | POA: Diagnosis not present

## 2022-06-01 DIAGNOSIS — K529 Noninfective gastroenteritis and colitis, unspecified: Secondary | ICD-10-CM | POA: Diagnosis not present

## 2022-06-01 DIAGNOSIS — K5900 Constipation, unspecified: Secondary | ICD-10-CM | POA: Diagnosis not present

## 2022-06-01 MED ORDER — AMLODIPINE BESY-BENAZEPRIL HCL 5-40 MG PO CAPS: 1.0000 | ORAL_CAPSULE | Freq: Every day | ORAL | 1 refills | Status: DC

## 2022-06-01 NOTE — Patient Instructions (Addendum)
        Medications changes include :   decrease BP medication - amlodipine-benazepril 5-40 mg       Return for reschedule march appointment.

## 2022-06-01 NOTE — Assessment & Plan Note (Signed)
Acute Has had similar in the past CT scan showed some mild thickening from the splenic flexure and proximal descending colon Started on Augmentin twice daily Pain has resolved Recommend that he complete the antibiotics Some of his pain sounds like it is from constipation and is more been chronic, intermittent issue Scheduled for colonoscopy, which would be good to evaluate that area

## 2022-06-01 NOTE — Assessment & Plan Note (Signed)
Chronic Blood pressure good here, but typically lower at home and he is having low blood pressure at times-even SBP less than 100.  He is having lightheadedness Decrease amlodipine-benazepril to 5-40 mg daily Continue metoprolol XL 150 mg daily Continue to monitor BP at home Has follow-up in March

## 2022-06-01 NOTE — Assessment & Plan Note (Signed)
Chronic Having intermittent constipation-he will not have a bowel movement for couple of days-initially the stool be hard and then very soft afterwards He does have abdominal pain associated with this Advised that he take a stool softener or MiraLAX daily in order to prevent the constipation which may prevent the abdominal pain He should be taking Metamucil, but does not drink very much water so not sure how helpful that would be Has colonoscopy scheduled

## 2022-06-08 DIAGNOSIS — R3912 Poor urinary stream: Secondary | ICD-10-CM | POA: Diagnosis not present

## 2022-06-08 DIAGNOSIS — N401 Enlarged prostate with lower urinary tract symptoms: Secondary | ICD-10-CM | POA: Diagnosis not present

## 2022-06-09 DIAGNOSIS — M25511 Pain in right shoulder: Secondary | ICD-10-CM | POA: Diagnosis not present

## 2022-06-09 DIAGNOSIS — M75121 Complete rotator cuff tear or rupture of right shoulder, not specified as traumatic: Secondary | ICD-10-CM | POA: Diagnosis not present

## 2022-06-11 ENCOUNTER — Ambulatory Visit (AMBULATORY_SURGERY_CENTER): Payer: BC Managed Care – PPO | Admitting: *Deleted

## 2022-06-11 ENCOUNTER — Other Ambulatory Visit: Payer: Self-pay | Admitting: Internal Medicine

## 2022-06-11 VITALS — Ht 72.0 in | Wt 325.0 lb

## 2022-06-11 DIAGNOSIS — Z86010 Personal history of colonic polyps: Secondary | ICD-10-CM

## 2022-06-11 MED ORDER — NA SULFATE-K SULFATE-MG SULF 17.5-3.13-1.6 GM/177ML PO SOLN: 1.0000 | Freq: Once | ORAL | 0 refills | Status: AC

## 2022-06-11 NOTE — Progress Notes (Signed)
No egg or soy allergy known to patient  No issues known to pt with past sedation with any surgeries or procedures Patient denies ever being told they had issues or difficulty with intubation  No FH of Malignant Hyperthermia Pt is not on diet pills Pt is not on  home 02  Pt is not on blood thinners  Pt intermittent issues with constipation Hx colitis In hospital last week per pt. Pt is not on dialysis Pt denies any upcoming cardiac testing Pt encouraged to use to use Singlecare or Goodrx to reduce cost  Patient's chart reviewed by Osvaldo Angst CNRA prior to previsit and patient appropriate for the [facility].  Previsit completed and red dot placed by patient's name on their procedure day (on provider's schedule).  . Visit by phone Instructions reviewed with pt and pt states understanding. Instructed to review again prior to procedure. Pt states they will.  Instructions sent by mail and my chart

## 2022-06-12 DIAGNOSIS — M25511 Pain in right shoulder: Secondary | ICD-10-CM | POA: Diagnosis not present

## 2022-06-12 DIAGNOSIS — M25512 Pain in left shoulder: Secondary | ICD-10-CM | POA: Diagnosis not present

## 2022-06-12 DIAGNOSIS — M75121 Complete rotator cuff tear or rupture of right shoulder, not specified as traumatic: Secondary | ICD-10-CM | POA: Diagnosis not present

## 2022-06-19 ENCOUNTER — Ambulatory Visit (INDEPENDENT_AMBULATORY_CARE_PROVIDER_SITE_OTHER): Payer: BC Managed Care – PPO | Admitting: Pulmonary Disease

## 2022-06-19 ENCOUNTER — Encounter (HOSPITAL_BASED_OUTPATIENT_CLINIC_OR_DEPARTMENT_OTHER): Payer: Self-pay | Admitting: Pulmonary Disease

## 2022-06-19 VITALS — BP 132/80 | HR 101 | Temp 98.2°F | Ht 72.0 in | Wt 330.9 lb

## 2022-06-19 DIAGNOSIS — G4733 Obstructive sleep apnea (adult) (pediatric): Secondary | ICD-10-CM | POA: Diagnosis not present

## 2022-06-19 NOTE — Patient Instructions (Signed)
X Set up new DME X Rx for replacement auto CPAP 10-16 cm

## 2022-06-19 NOTE — Assessment & Plan Note (Signed)
CPAP download was reviewed which shows good control of events on auto settings 10 to 16 cm, average pressure of 11 cm.  He has very good compliance more than 8 hours per night, events are very well-controlled.  He has mild leak. CPAP recently helped improve his daytime somnolence and fatigue We will help set him up with a new DME and also provide him with a replacement CPAP machine at auto settings 10 to 16 cm  Weight loss encouraged, compliance with goal of at least 4-6 hrs every night is the expectation. Advised against medications with sedative side effects Cautioned against driving when sleepy - understanding that sleepiness will vary on a day to day basis

## 2022-06-19 NOTE — Assessment & Plan Note (Signed)
Weight loss encouraged

## 2022-06-19 NOTE — Progress Notes (Signed)
   Subjective:    Patient ID: Patrick Butler, male    DOB: 1960-06-22, 62 y.o.   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 States things are going well with the cpap.   Last seen by me 05/2018, he saw APP 05/2020. He has an AirSense 10, DME was [facility] home care which has closed down.  He request new DME set up.  He has also had the same machine for 6-year and request an upgrade. He obtained a ResMed mini by himself but the noise was too loud within the house, he feels that his home unit works better and is quieter. He has settled down with nasal pillows and denies any problems with mask or pressure. He uses Afrin occasionally for sinus congestion.  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 His urologist put him on Flomax and this caused syncopal episode, dose has been cut in half He has lost 18 pounds from 2020  Significant tests/ events reviewed NPSG 08/2011:  AHI 21/hr, nasal cpap titrated to 16cm    Review of Systems neg for any significant sore throat, dysphagia, itching, sneezing, nasal congestion or excess/ purulent secretions, fever, chills, sweats, unintended wt loss, pleuritic or exertional cp, hempoptysis, orthopnea pnd or change in chronic leg swelling. Also denies presyncope, palpitations, heartburn, abdominal pain, nausea, vomiting, diarrhea or change in bowel or urinary habits, dysuria,hematuria, rash, arthralgias, visual complaints, headache, numbness weakness or ataxia.     Objective:   Physical Exam  Gen. Pleasant,  obese, in no distress, normal affect ENT - no pallor,icterus, no post nasal drip, class 2 airway Neck: No JVD, no thyromegaly, no carotid bruits Lungs: no use of accessory muscles, no dullness to percussion, decreased without rales or rhonchi  Cardiovascular: Rhythm regular, heart sounds  normal, no murmurs or gallops, no peripheral edema Abdomen: soft and non-tender, no hepatosplenomegaly, BS normal. Musculoskeletal: No deformities, no cyanosis or clubbing Neuro:  alert, non focal, no tremors       Assessment & Plan:

## 2022-06-26 ENCOUNTER — Encounter: Payer: Self-pay | Admitting: Gastroenterology

## 2022-07-02 ENCOUNTER — Other Ambulatory Visit: Payer: Self-pay | Admitting: Internal Medicine

## 2022-07-09 ENCOUNTER — Other Ambulatory Visit: Payer: Self-pay | Admitting: Internal Medicine

## 2022-07-09 ENCOUNTER — Telehealth (HOSPITAL_BASED_OUTPATIENT_CLINIC_OR_DEPARTMENT_OTHER): Payer: Self-pay | Admitting: Pulmonary Disease

## 2022-07-09 NOTE — Telephone Encounter (Signed)
Pt states has not heard from his new DME company yet. Pt states someone was going to check with insurance prior to choosing which DME company. Please advise and call pt with an update.

## 2022-07-10 ENCOUNTER — Encounter: Payer: Self-pay | Admitting: Gastroenterology

## 2022-07-10 ENCOUNTER — Other Ambulatory Visit: Payer: Self-pay | Admitting: Internal Medicine

## 2022-07-10 ENCOUNTER — Ambulatory Visit (AMBULATORY_SURGERY_CENTER): Payer: BC Managed Care – PPO | Admitting: Gastroenterology

## 2022-07-10 VITALS — BP 108/61 | HR 84 | Temp 98.2°F | Resp 14 | Ht 72.0 in | Wt 325.0 lb

## 2022-07-10 DIAGNOSIS — D120 Benign neoplasm of cecum: Secondary | ICD-10-CM | POA: Diagnosis not present

## 2022-07-10 DIAGNOSIS — D123 Benign neoplasm of transverse colon: Secondary | ICD-10-CM | POA: Diagnosis not present

## 2022-07-10 DIAGNOSIS — Z09 Encounter for follow-up examination after completed treatment for conditions other than malignant neoplasm: Secondary | ICD-10-CM

## 2022-07-10 DIAGNOSIS — Z86010 Personal history of colonic polyps: Secondary | ICD-10-CM

## 2022-07-10 MED ORDER — SODIUM CHLORIDE 0.9 % IV SOLN: 500.0000 mL | INTRAVENOUS | Status: DC

## 2022-07-10 NOTE — Progress Notes (Signed)
Sedate, gd SR, tolerated procedure well, VSS, report to RN 

## 2022-07-10 NOTE — Progress Notes (Signed)
History and Physical:  This patient presents for endoscopic testing for: Encounter Diagnosis  Name Primary?   Hx of colonic polyp Yes    12 TA and SSP last colonoscopy Jan 2023 Single TA May 2017 (fair prep on that exam) Recent episode of abd pain Jan 23rd.  Lead to syncope and ED visit.  CTAP with question of splenic flexure wall thickening less prominent than on a 2020 scan. BMs tend to be every few days, then hard to pass and "muddy" afterward  Patient is otherwise without complaints or active issues today.   Past Medical History: Past Medical History:  Diagnosis Date   Anxiety    Arthritis    right knee   Cancer ([place]) 2005   tonsils/Chemo and radiation   Dysphagia 08/2014   Dyspnea    with exertion   Full dentures    GERD (gastroesophageal reflux disease)    History of chemotherapy    History of radiation therapy    neck   Hyperlipidemia    Hypertension    states under control with med., has been on med. ~ 5 yr.   Hypothyroidism    Non-insulin dependent type 2 diabetes mellitus (HCC)    Posterior neck pain    Seasonal allergies    Sleep apnea    uses CPAP nightly     Past Surgical History: Past Surgical History:  Procedure Laterality Date   COLONOSCOPY  2012   COLONOSCOPY WITH PROPOFOL N/A 10/04/2015   Procedure: COLONOSCOPY WITH PROPOFOL;  Surgeon: Doran Stabler, MD;  Location: [facility];  Service: Gastroenterology;  Laterality: N/A;   DIRECT LARYNGOSCOPY N/A 08/20/2014   Procedure: DIRECT LARYNGOSCOPY;  Surgeon: Rozetta Nunnery, MD;  Location: [facility];  Service: ENT;  Laterality: N/A;   DIRECT LARYNGOSCOPY N/A 08/21/2016   Procedure: DIRECT LARYNGOSCOPY;  Surgeon: Rozetta Nunnery, MD;  Location: [facility];  Service: ENT;  Laterality: N/A;   DIRECT LARYNGOSCOPY N/A 03/11/2020   Procedure: DIRECT LARYNGOSCOPY;  Surgeon: Rozetta Nunnery, MD;  Location: [facility];  Service: ENT;  Laterality: N/A;   ESOPHAGEAL  DILATION N/A 03/11/2020   Procedure: INSERTION OF GUIDE WIRE FOLLOWED BY DILATION OVER GUIDE WIRE;  Surgeon: Rozetta Nunnery, MD;  Location: [facility];  Service: ENT;  Laterality: N/A;   ESOPHAGOSCOPY WITH DILITATION N/A 08/20/2014   Procedure: ESOPHAGOSCOPY WITH DILATATION;  Surgeon: Rozetta Nunnery, MD;  Location: [facility];  Service: ENT;  Laterality: N/A;   ESOPHAGOSCOPY WITH DILITATION N/A 08/21/2016   Procedure: ESOPHAGOSCOPY WITH DILITATION;  Surgeon: Rozetta Nunnery, MD;  Location: [facility];  Service: ENT;  Laterality: N/A;   KNEE ARTHROPLASTY Right    KNEE ARTHROSCOPY Right    x 3   KNEE ARTHROSCOPY Left 10/28/2015   Procedure: ARTHROSCOPY KNEE;  Surgeon: Dorna Leitz, MD;  Location: [facility];  Service: Orthopedics;  Laterality: Left;  left knee partial medial meniscectomy and patellofemoral chondroplasty   LAPAROSCOPIC GASTRIC BYPASS  2004   LUMBAR LAMINECTOMY/DECOMPRESSION MICRODISCECTOMY  05/24/2011   Procedure: LUMBAR LAMINECTOMY/DECOMPRESSION MICRODISCECTOMY;  Surgeon: Winfield Cunas, MD;  Location: [facility];  Service: Neurosurgery;  Laterality: Right;  RIGHT L5-S1 Laminectomy, discectomy   LYMPHADENECTOMY Right    cervical   MULTIPLE TOOTH EXTRACTIONS     PORT-A-CATH REMOVAL     PORTACATH PLACEMENT     REPLACEMENT TOTAL KNEE Right 03/22/2004   SHOULDER ARTHROSCOPY Left    SHOULDER OPEN ROTATOR CUFF REPAIR Right  Allergies: Allergies  Allergen Reactions   Sulfa Antibiotics Rash    Outpatient Meds: Current Outpatient Medications  Medication Sig Dispense Refill   amLODipine-benazepril (LOTREL) 5-40 MG capsule Take 1 capsule by mouth daily. 90 capsule 1   aspirin EC 81 MG tablet Take 81 mg by mouth every evening.      fluticasone (FLONASE) 50 MCG/ACT nasal spray Place 2 sprays into the nose as directed. At bedtime (Patient taking differently: Place 2 sprays into the nose at bedtime.) 16 g 5   ketoconazole (NIZORAL) 2 % shampoo  Apply 1 application topically 2 (two) times a week.     levothyroxine (SYNTHROID) 200 MCG tablet TAKE 1 TABLET BY MOUTH DAILY BEFORE BREAKFAST 90 tablet 1   metoprolol succinate (TOPROL-XL) 100 MG 24 hr tablet TAKE 1 TABLET BY MOUTH DAILY WITH A MEAL 90 tablet 2   metoprolol succinate (TOPROL-XL) 50 MG 24 hr tablet TAKE 1 TABLET BY MOUTH DAILY WITH '100MG'$  TO TOTAL '150MG'$  DAILY 90 tablet 3   Multiple Vitamins-Minerals (MULTIVITAMIN WITH MINERALS) tablet Take 1 tablet by mouth daily.      pantoprazole (PROTONIX) 40 MG tablet TAKE 1 TABLET BY MOUTH DAILY 90 tablet 2   pravastatin (PRAVACHOL) 40 MG tablet TAKE 1 TABLET BY MOUTH DAILY 90 tablet 3   tadalafil (CIALIS) 5 MG tablet Take 5 mg by mouth daily.     cetirizine (ZYRTEC) 10 MG tablet Take 1 tablet by mouth daily.     LORazepam (ATIVAN) 0.5 MG tablet TAKE 1 TABLET BY MOUTH DAILY AS NEEDED FOR ANXIETY 30 tablet 0   ondansetron (ZOFRAN) 4 MG tablet Take 1 tablet (4 mg total) by mouth every 8 (eight) hours as needed for nausea or vomiting. (Patient not taking: Reported on 07/10/2022) 12 tablet 0   Semaglutide, 2 MG/DOSE, 8 MG/3ML SOPN Inject 2 mg as directed once a week. 9 mL 1   solifenacin (VESICARE) 10 MG tablet      tamsulosin (FLOMAX) 0.4 MG CAPS capsule      Current Facility-Administered Medications  Medication Dose Route Frequency Provider Last Rate Last Admin   0.9 %  sodium chloride infusion  500 mL Intravenous Continuous Danis, Estill Cotta III, MD          ___________________________________________________________________ Objective   Exam:  BP 123/63   Pulse 94   Temp 98.2 F (36.8 C)   Ht 6' (1.829 m)   Wt (!) 325 lb (147.4 kg)   SpO2 94%   BMI 44.08 kg/m   CV: regular , S1/S2 Resp: clear to auscultation bilaterally, normal RR and effort noted GI: soft, no tenderness, with active bowel sounds.   Assessment: Encounter Diagnosis  Name Primary?   Hx of colonic polyp Yes     Plan: Colonoscopy  The benefits and risks  of the planned procedure were described in detail with the patient or (when appropriate) their health care proxy.  Risks were outlined as including, but not limited to, bleeding, infection, perforation, adverse medication reaction leading to cardiac or pulmonary decompensation, pancreatitis (if ERCP).  The limitation of incomplete mucosal visualization was also discussed.  No guarantees or warranties were given.    The patient is appropriate for an endoscopic procedure in the ambulatory setting.   - Wilfrid Lund, MD

## 2022-07-10 NOTE — Op Note (Addendum)
[facility] Patient Name: Patrick Butler Procedure Date: 07/10/2022 12:41 PM MRN: [id number] Endoscopist: Realitos. Loletha Carrow , MD, [id number] Age: 62 Referring MD:  Date of Birth: 1960/11/05 Gender: Male Account #: [id number] Procedure:                Colonoscopy Indications:              Surveillance: History of numerous (> 10) adenomas                            on last colonoscopy (< 3 yrs)                           12 polyps January 2023 (majority tubular adenoma,                            others SSP)                           Single subcentimeter tubular adenoma May 2017 (fair                            prep on that exam) Medicines:                Monitored Anesthesia Care Procedure:                Pre-Anesthesia Assessment:                           - Prior to the procedure, a History and Physical                            was performed, and patient medications and                            allergies were reviewed. The patient's tolerance of                            previous anesthesia was also reviewed. The risks                            and benefits of the procedure and the sedation                            options and risks were discussed with the patient.                            All questions were answered, and informed consent                            was obtained. Prior Anticoagulants: The patient has                            taken no anticoagulant or antiplatelet agents. ASA  Grade Assessment: III - A patient with severe                            systemic disease. After reviewing the risks and                            benefits, the patient was deemed in satisfactory                            condition to undergo the procedure.                           After obtaining informed consent, the colonoscope                            was passed under direct vision. Throughout the                            procedure, the  patient's blood pressure, pulse, and                            oxygen saturations were monitored continuously. The                            Olympus SN [id number] was introduced through the anus                            and advanced to the the cecum, identified by                            appendiceal orifice and ileocecal valve. The                            colonoscopy was performed with difficulty due to a                            redundant colon, significant looping and the                            patient's body habitus. Successful completion of                            the procedure was aided by using manual pressure,                            straightening and shortening the scope to obtain                            bowel loop reduction and lavage. The patient                            tolerated the procedure well. The quality of the  bowel preparation was good in most areas but fair                            in some areas after lavage. The ileocecal valve,                            appendiceal orifice, and rectum were photographed.                            The bowel preparation used was SUPREP via split                            dose instruction. Scope In: 12:55:28 PM Scope Out: 1:13:27 PM Scope Withdrawal Time: 0 hours 12 minutes 46 seconds  Total Procedure Duration: 0 hours 17 minutes 59 seconds  Findings:                 The perianal and digital rectal examinations were                            normal.                           Three sessile polyps were found in the transverse                            colon and cecum. The polyps were diminutive in                            size. These polyps were removed with a cold snare.                            Resection and retrieval were complete.                           The sigmoid colon was redundant.                           - From the splenic flexure to proximal sigmoid                             colon there are scattered areas of mucosal                            scarring, atrophy and flattened mucosal folds                            suggestive of a previously active colitis.                           Repeat examination of right colon under NBI                            performed.  Internal hemorrhoids were found.                           The exam was otherwise without abnormality on                            direct and retroflexion views. Complications:            No immediate complications. Estimated Blood Loss:     Estimated blood loss was minimal. Impression:               - Three diminutive polyps in the transverse colon                            and in the cecum, removed with a cold snare.                            Resected and retrieved.                           - Redundant colon. Other left colon nonspecific                            mucosal abnormality as noted above.                           - Internal hemorrhoids.                           - The examination was otherwise normal on direct                            and retroflexion views. Recommendation:           - Patient has a contact number available for                            emergencies. The signs and symptoms of potential                            delayed complications were discussed with the                            patient. Return to normal activities tomorrow.                            Written discharge instructions were provided to the                            patient.                           - Resume previous diet.                           - Continue present medications.                           -  Await pathology results.                           - Repeat colonoscopy is recommended for                            surveillance. The colonoscopy date will be                            determined after pathology results from today's                             exam become available for review. (This patient                            needs to take a GoLytely prep for every future                            colonoscopy due to chronic constipation)                           - Increase dietary fiber and water intake and                            physical activity to help relieve constipation.                           Begin MiraLAX 1 capful per day and a glass of                            liquid, adjust dose as needed.                           Follow-up with Dr. Loletha Carrow in the office regarding                            chronic constipation. Patrick Butler L. Loletha Carrow, MD 07/10/2022 1:22:25 PM This report has been signed electronically.

## 2022-07-10 NOTE — Progress Notes (Signed)
Pt's states no medical or surgical changes since previsit or office visit. 

## 2022-07-10 NOTE — Telephone Encounter (Signed)
Spoke with pt's wife. Informed her the order was sent to [facility] and per Raven she has reached out yesterday and today and is waiting on a return call from [facility] for an update. Pt's wife request an update once we hear back from [facility]. Will forward to Raven to call pt once able. Thanks Raven!

## 2022-07-10 NOTE — Progress Notes (Signed)
Called to room to assist during endoscopic procedure.  Patient ID and intended procedure confirmed with present staff. Received instructions for my participation in the procedure from the performing physician.  

## 2022-07-10 NOTE — Patient Instructions (Addendum)
Handouts Provided:  Polyps  YOU HAD AN ENDOSCOPIC PROCEDURE TODAY AT [facility]:   Refer to the procedure report that was given to you for any specific questions about what was found during the examination.  If the procedure report does not answer your questions, please call your gastroenterologist to clarify.  If you requested that your care partner not be given the details of your procedure findings, then the procedure report has been included in a sealed envelope for you to review at your convenience later.  YOU SHOULD EXPECT: Some feelings of bloating in the abdomen. Passage of more gas than usual.  Walking can help get rid of the air that was put into your GI tract during the procedure and reduce the bloating. If you had a lower endoscopy (such as a colonoscopy or flexible sigmoidoscopy) you may notice spotting of blood in your stool or on the toilet paper. If you underwent a bowel prep for your procedure, you may not have a normal bowel movement for a few days.  Please Note:  You might notice some irritation and congestion in your nose or some drainage.  This is from the oxygen used during your procedure.  There is no need for concern and it should clear up in a day or so.  SYMPTOMS TO REPORT IMMEDIATELY:  Following lower endoscopy (colonoscopy or flexible sigmoidoscopy):  Excessive amounts of blood in the stool  Significant tenderness or worsening of abdominal pains  Swelling of the abdomen that is new, acute  Fever of 100F or higher  For urgent or emergent issues, a gastroenterologist can be reached at any hour by calling [phone]. Do not use MyChart messaging for urgent concerns.    DIET:  We do recommend a small meal at first, but then you may proceed to your regular diet.  Drink plenty of fluids but you should avoid alcoholic beverages for 24 hours.  ACTIVITY:  You should plan to take it easy for the rest of today and you should NOT DRIVE or use heavy  machinery until tomorrow (because of the sedation medicines used during the test).    FOLLOW UP: Our staff will call the number listed on your records the next business day following your procedure.  We will call around 7:15- 8:00 am to check on you and address any questions or concerns that you may have regarding the information given to you following your procedure. If we do not reach you, we will leave a message.     If any biopsies were taken you will be contacted by phone or by letter within the next 1-3 weeks.  Please call us at [phone] if you have not heard about the biopsies in 3 weeks.    SIGNATURES/CONFIDENTIALITY: You and/or your care partner have signed paperwork which will be entered into your electronic medical record.  These signatures attest to the fact that that the information above on your After Visit Summary has been reviewed and is understood.  Full responsibility of the confidentiality of this discharge information lies with you and/or your care-partner.

## 2022-07-10 NOTE — Progress Notes (Signed)
[place] relieves [employer]

## 2022-07-11 ENCOUNTER — Telehealth: Payer: Self-pay

## 2022-07-11 NOTE — Telephone Encounter (Signed)
  Follow up Call-     07/10/2022   11:29 AM 05/11/2021    9:21 AM  Call back number  Post procedure Call Back phone  # [phone]  Permission to leave phone message Yes Yes     Patient questions:  Do you have a fever, pain , or abdominal swelling? No. Pain Score  0 *  Have you tolerated food without any problems? Yes.    Have you been able to return to your normal activities? Yes.    Do you have any questions about your discharge instructions: Diet   No. Medications  No. Follow up visit  No.  Do you have questions or concerns about your Care? No.  Actions: * If pain score is 4 or above: No action needed, pain <4.

## 2022-07-13 ENCOUNTER — Telehealth: Payer: Self-pay | Admitting: Gastroenterology

## 2022-07-13 DIAGNOSIS — G4733 Obstructive sleep apnea (adult) (pediatric): Secondary | ICD-10-CM | POA: Diagnosis not present

## 2022-07-13 NOTE — Telephone Encounter (Signed)
Pt scheduled to see Dr. Loletha Carrow 07/24/22 at 11am, please let pt know appt date and time.

## 2022-07-13 NOTE — Telephone Encounter (Signed)
Inbound call from patient stating that he had a colonoscopy with Dr. Loletha Carrow on 3/5 and he was told that he needed to make a follow up appointment with Dr. Loletha Carrow for " constipation" I offered Dr. Loletha Carrow next available ( May) and he stated he believed that Dr. Loletha Carrow wanted to see him before then. Patient requested a message to be sent to the nurse to see if she can see him sooner. Please advise.

## 2022-07-13 NOTE — Telephone Encounter (Signed)
Patient has been advised of appt date and time.

## 2022-07-16 ENCOUNTER — Encounter: Payer: Self-pay | Admitting: Gastroenterology

## 2022-07-24 ENCOUNTER — Ambulatory Visit: Payer: BC Managed Care – PPO | Admitting: Gastroenterology

## 2022-07-24 ENCOUNTER — Encounter: Payer: Self-pay | Admitting: Gastroenterology

## 2022-07-24 VITALS — BP 130/82 | HR 107 | Ht 72.0 in | Wt 322.2 lb

## 2022-07-24 DIAGNOSIS — Z86010 Personal history of colonic polyps: Secondary | ICD-10-CM | POA: Diagnosis not present

## 2022-07-24 DIAGNOSIS — K5909 Other constipation: Secondary | ICD-10-CM | POA: Diagnosis not present

## 2022-07-24 NOTE — Progress Notes (Signed)
[facility] Progress Note  Chief Complaint:  Chief Complaint  Patient presents with   Follow-up    Constipation, states taking Miralax daily helping for regular BM    Subjective  History: Patrick Butler had a recent surveillance colonoscopy with me (07/10/2022), again with multiple adenomatous polyps and also fair preparation as on prior exams.  Redundant colon.   He wanted to see me for further evaluation and management of his constipation.  Today, he states he's been experiencing constipation for the past year. He states he would have a BM every 3-4 days. He describes his stool is hard then tends to be explosive. Since our last visit, he's been using one cap of miralax a day with a protein shake around 6 am. He state's his stool is typically loose with some chunks and would occasionally  be solid.   He reports eating fruits such as bananas, but states he's unable to eat apples due to his dentures.   However, he states he does eat cooked vegetables.  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  Once a [employer].  ROS: Cardiovascular:  no chest pain Respiratory: no dyspnea  The patient's Past Medical, Family and Social History were reviewed and are on file in the EMR. Past Medical History:  Diagnosis Date   Anxiety    Arthritis    right knee   Cancer ([place]) 2005   tonsils/Chemo and radiation   Dysphagia 08/2014   Dyspnea    with exertion   Full dentures    GERD (gastroesophageal reflux disease)    History of chemotherapy    History of radiation therapy    neck   Hyperlipidemia    Hypertension    states under control with med., has been on med. ~ 5 yr.   Hypothyroidism    Non-insulin dependent type 2 diabetes mellitus (HCC)    Posterior neck pain    Seasonal allergies    Sleep apnea    uses CPAP nightly    Objective:  Med list reviewed  Current Outpatient Medications:    amLODipine-benazepril (LOTREL) 5-40  MG capsule, Take 1 capsule by mouth daily., Disp: 90 capsule, Rfl: 1   aspirin EC 81 MG tablet, Take 81 mg by mouth every evening. , Disp: , Rfl:    fluticasone (FLONASE) 50 MCG/ACT nasal spray, Place 2 sprays into the nose as directed. At bedtime (Patient taking differently: Place 2 sprays into the nose at bedtime.), Disp: 16 g, Rfl: 5   ketoconazole (NIZORAL) 2 % shampoo, Apply 1 application topically 2 (two) times a week., Disp: , Rfl:    levothyroxine (SYNTHROID) 200 MCG tablet, TAKE 1 TABLET BY MOUTH DAILY BEFORE BREAKFAST, Disp: 90 tablet, Rfl: 1   LORazepam (ATIVAN) 0.5 MG tablet, TAKE 1 TABLET BY MOUTH DAILY AS NEEDED FOR ANXIETY, Disp: 30 tablet, Rfl: 0   metoprolol succinate (TOPROL-XL) 100 MG 24 hr tablet, TAKE 1 TABLET BY MOUTH DAILY WITH A MEAL, Disp: 90 tablet, Rfl: 2   metoprolol succinate (TOPROL-XL) 50 MG 24 hr tablet, TAKE 1 TABLET BY MOUTH DAILY WITH 100MG  TO TOTAL 150MG  DAILY, Disp: 90 tablet, Rfl: 3   Multiple Vitamins-Minerals (MULTIVITAMIN WITH MINERALS) tablet, Take 1 tablet by mouth daily. , Disp: , Rfl:    pantoprazole (PROTONIX) 40 MG tablet, TAKE 1 TABLET BY MOUTH DAILY, Disp: 90 tablet, Rfl: 2   pravastatin (PRAVACHOL) 40 MG tablet, TAKE  1 TABLET BY MOUTH DAILY, Disp: 90 tablet, Rfl: 3   Semaglutide, 2 MG/DOSE, 8 MG/3ML SOPN, Inject 2 mg as directed once a week., Disp: 9 mL, Rfl: 1   tadalafil (CIALIS) 5 MG tablet, Take 5 mg by mouth daily., Disp: , Rfl:    Vital signs in last 24 hrs: Vitals:   07/24/22 1114  BP: 130/82  Pulse: (!) 107  SpO2: 97%   Wt Readings from Last 3 Encounters:  07/24/22 (!) 322 lb 4 oz (146.2 kg)  07/10/22 (!) 325 lb (147.4 kg)  06/19/22 (!) 330 lb 14.6 oz (150.1 kg)    Physical Exam   HEENT: sclera anicteric, oral mucosa moist without lesions Neck: supple, no thyromegaly, JVD or lymphadenopathy Cardiac: RRR,  no peripheral edema Pulm: clear to auscultation bilaterally, normal RR and effort noted Abdomen: soft, no tenderness, with  active bowel sounds. No guarding or palpable hepatosplenomegaly.  (Limited by body habitus) Skin; warm and dry, no jaundice or rash  Labs:   ___________________________________________ Radiologic studies:   ____________________________________________ Other:  Colon polyps were adenomatous -1 year recall recommended (recurrent adenomas 1 year after prior exam, fair preparation) _____________________________________________ Assessment & Plan  Assessment: Chronic constipation  Hx of colonic polyp   Plan: Follow up in a year or PRN.  DAILY Miralax adjust dose as needed.   Increase fiber intake Increase exercise as much as possible  Colonoscopy in 1 year  20 minutes were spent on this encounter (including chart review, history/exam, counseling/coordination of care, and documentation) > 50% of that time was spent on counseling and coordination of care.    Wilfrid Lund, MD    Velora Heckler Lanette Hampshire, [place], MD, have reviewed all documentation for this visit. The documentation on 07/24/22 for the exam, diagnosis, procedures, and orders are all accurate and complete.   I,Safa M Kadhim,acting as a scribe for Tracy, MD.,have documented all relevant documentation on the behalf of Doran Stabler, MD,as directed by  Doran Stabler, MD while in the presence of Doran Stabler, MD.   Salina April III, MD, have reviewed all documentation for this visit. The documentation on 07/24/22 for the exam, diagnosis, procedures, and orders are all accurate and complete.   Jari Sportsman Ruta Hinds

## 2022-07-24 NOTE — Progress Notes (Deleted)
[facility] Progress Note  Chief Complaint: ***  Subjective  History: Patrick Butler had a recent surveillance colonoscopy with me (07/10/2022), again with multiple adenomatous polyps and also fair preparation as on prior exams.  Redundant colon.   He wanted to see me for further evaluation and management of his constipation. ***  ROS: Cardiovascular:  no chest pain Respiratory: no dyspnea  The patient's Past Medical, Family and Social History were reviewed and are on file in the EMR. Past Medical History:  Diagnosis Date   Anxiety    Arthritis    right knee   Cancer ([place]) 2005   tonsils/Chemo and radiation   Dysphagia 08/2014   Dyspnea    with exertion   Full dentures    GERD (gastroesophageal reflux disease)    History of chemotherapy    History of radiation therapy    neck   Hyperlipidemia    Hypertension    states under control with med., has been on med. ~ 5 yr.   Hypothyroidism    Non-insulin dependent type 2 diabetes mellitus (HCC)    Posterior neck pain    Seasonal allergies    Sleep apnea    uses CPAP nightly    Objective:  Med list reviewed  Current Outpatient Medications:    amLODipine-benazepril (LOTREL) 5-40 MG capsule, Take 1 capsule by mouth daily., Disp: 90 capsule, Rfl: 1   aspirin EC 81 MG tablet, Take 81 mg by mouth every evening. , Disp: , Rfl:    cetirizine (ZYRTEC) 10 MG tablet, Take 1 tablet by mouth daily., Disp: , Rfl:    fluticasone (FLONASE) 50 MCG/ACT nasal spray, Place 2 sprays into the nose as directed. At bedtime (Patient taking differently: Place 2 sprays into the nose at bedtime.), Disp: 16 g, Rfl: 5   ketoconazole (NIZORAL) 2 % shampoo, Apply 1 application topically 2 (two) times a week., Disp: , Rfl:    levothyroxine (SYNTHROID) 200 MCG tablet, TAKE 1 TABLET BY MOUTH DAILY BEFORE BREAKFAST, Disp: 90 tablet, Rfl: 1   LORazepam (ATIVAN) 0.5 MG tablet, TAKE 1 TABLET BY MOUTH DAILY AS NEEDED FOR ANXIETY, Disp: 30 tablet, Rfl: 0    metoprolol succinate (TOPROL-XL) 100 MG 24 hr tablet, TAKE 1 TABLET BY MOUTH DAILY WITH A MEAL, Disp: 90 tablet, Rfl: 2   metoprolol succinate (TOPROL-XL) 50 MG 24 hr tablet, TAKE 1 TABLET BY MOUTH DAILY WITH 100MG  TO TOTAL 150MG  DAILY, Disp: 90 tablet, Rfl: 3   Multiple Vitamins-Minerals (MULTIVITAMIN WITH MINERALS) tablet, Take 1 tablet by mouth daily. , Disp: , Rfl:    ondansetron (ZOFRAN) 4 MG tablet, Take 1 tablet (4 mg total) by mouth every 8 (eight) hours as needed for nausea or vomiting. (Patient not taking: Reported on 07/10/2022), Disp: 12 tablet, Rfl: 0   pantoprazole (PROTONIX) 40 MG tablet, TAKE 1 TABLET BY MOUTH DAILY, Disp: 90 tablet, Rfl: 2   pravastatin (PRAVACHOL) 40 MG tablet, TAKE 1 TABLET BY MOUTH DAILY, Disp: 90 tablet, Rfl: 3   Semaglutide, 2 MG/DOSE, 8 MG/3ML SOPN, Inject 2 mg as directed once a week., Disp: 9 mL, Rfl: 1   solifenacin (VESICARE) 10 MG tablet, , Disp: , Rfl:    tadalafil (CIALIS) 5 MG tablet, Take 5 mg by mouth daily., Disp: , Rfl:    tamsulosin (FLOMAX) 0.4 MG CAPS capsule, , Disp: , Rfl:    Vital signs in last 24 hrs: There were no vitals filed for this visit. Wt Readings from Last 3 Encounters:  07/10/22 (!) 325 lb (147.4 kg)  06/19/22 (!) 330 lb 14.6 oz (150.1 kg)  06/11/22 (!) 325 lb (147.4 kg)    Physical Exam  *** HEENT: sclera anicteric, oral mucosa moist without lesions Neck: supple, no thyromegaly, JVD or lymphadenopathy Cardiac: ***,  no peripheral edema Pulm: clear to auscultation bilaterally, normal RR and effort noted Abdomen: soft, *** tenderness, with active bowel sounds. No guarding or palpable hepatosplenomegaly. Skin; warm and dry, no jaundice or rash  Labs:   ___________________________________________ Radiologic studies:   ____________________________________________ Other:  Colon polyps were adenomatous -1 year recall recommended (recurrent adenomas 1 year after prior exam, fair  preparation) _____________________________________________ Assessment & Plan  Assessment: No diagnosis found.    Plan:   *** minutes were spent on this encounter (including chart review, history/exam, counseling/coordination of care, and documentation) > 50% of that time was spent on counseling and coordination of care.   [name]

## 2022-07-25 ENCOUNTER — Other Ambulatory Visit: Payer: Self-pay | Admitting: Internal Medicine

## 2022-07-30 NOTE — Patient Instructions (Addendum)
      Blood work was ordered.   The lab is on the first floor.    Medications changes include :   none    A referral was ordered for XXX.     Someone will call you to schedule an appointment.    Return in about 6 months (around 01/31/2023) for Physical Exam.

## 2022-07-30 NOTE — Progress Notes (Unsigned)
Subjective:    Patient ID: Patrick Butler, male    DOB: 11/04/1960, 62 y.o.   MRN: [id number]     HPI Patrick Butler is here for follow up of his chronic medical problems.  A1c - 5.7%  2 months ago we decreased the dose of his amlodipine-benazepril due to lightheadedness and low BP - that did help.  He still has low BP at times. -- not often.    Medications and allergies reviewed with patient and updated if appropriate.  Current Outpatient Medications on File Prior to Visit  Medication Sig Dispense Refill   amLODipine-benazepril (LOTREL) 5-40 MG capsule Take 1 capsule by mouth daily. 90 capsule 1   aspirin EC 81 MG tablet Take 81 mg by mouth every evening.      fluticasone (FLONASE) 50 MCG/ACT nasal spray Place 2 sprays into the nose as directed. At bedtime (Patient taking differently: Place 2 sprays into the nose at bedtime.) 16 g 5   ketoconazole (NIZORAL) 2 % shampoo Apply 1 application topically 2 (two) times a week.     levothyroxine (SYNTHROID) 200 MCG tablet TAKE 1 TABLET BY MOUTH DAILY BEFORE BREAKFAST 90 tablet 1   LORazepam (ATIVAN) 0.5 MG tablet TAKE 1 TABLET BY MOUTH DAILY AS NEEDED FOR ANXIETY 30 tablet 0   metoprolol succinate (TOPROL-XL) 100 MG 24 hr tablet TAKE 1 TABLET BY MOUTH DAILY WITH A MEAL 90 tablet 2   metoprolol succinate (TOPROL-XL) 50 MG 24 hr tablet TAKE 1 TABLET BY MOUTH DAILY WITH 100MG  TO TOTAL 150MG  DAILY 90 tablet 3   Multiple Vitamins-Minerals (MULTIVITAMIN WITH MINERALS) tablet Take 1 tablet by mouth daily.      OZEMPIC, 2 MG/DOSE, 8 MG/3ML SOPN INJECT 2 MGS SUBCUTANEOUSLY ONCE WEEKLY 9 mL 1   pantoprazole (PROTONIX) 40 MG tablet TAKE 1 TABLET BY MOUTH DAILY 90 tablet 2   pravastatin (PRAVACHOL) 40 MG tablet TAKE 1 TABLET BY MOUTH DAILY 90 tablet 3   tadalafil (CIALIS) 5 MG tablet Take 5 mg by mouth daily.     No current facility-administered medications on file prior to visit.     Review of Systems  Constitutional:  Negative for fever.  Eyes:   Positive for photophobia (at times).  Respiratory:  Negative for cough, shortness of breath and wheezing.   Cardiovascular:  Positive for leg swelling (wearing compression socks). Negative for chest pain and palpitations.  Neurological:  Positive for light-headedness (occ) and headaches (headaches, occ migraines).       Objective:   Vitals:   07/31/22 0920 07/31/22 0923  BP: (!) 140/84 126/78  Pulse: 96   Temp: 98.4 F (36.9 C)   SpO2: 92%    BP Readings from Last 3 Encounters:  07/31/22 126/78  07/24/22 130/82  07/10/22 108/61   Wt Readings from Last 3 Encounters:  07/31/22 (!) 319 lb (144.7 kg)  07/24/22 (!) 322 lb 4 oz (146.2 kg)  07/10/22 (!) 325 lb (147.4 kg)   Body mass index is 43.26 kg/m.    Physical Exam Constitutional:      General: He is not in acute distress.    Appearance: Normal appearance. He is not ill-appearing.  HENT:     Head: Normocephalic and atraumatic.  Eyes:     Conjunctiva/sclera: Conjunctivae normal.  Cardiovascular:     Rate and Rhythm: Normal rate and regular rhythm.     Heart sounds: Normal heart sounds.  Pulmonary:     Effort: Pulmonary effort is normal. No  respiratory distress.     Breath sounds: Normal breath sounds. No wheezing or rales.  Musculoskeletal:     Right lower leg: No edema.     Left lower leg: No edema.  Skin:    General: Skin is warm and dry.     Findings: No rash.  Neurological:     Mental Status: He is alert. Mental status is at baseline.  Psychiatric:        Mood and Affect: Mood normal.        Lab Results  Component Value Date   WBC 10.1 05/29/2022   HGB 13.1 05/29/2022   HCT 40.4 05/29/2022   PLT 132 (L) 05/29/2022   GLUCOSE 111 (H) 05/29/2022   CHOL 132 01/30/2022   TRIG 159.0 (H) 01/30/2022   HDL 41.00 01/30/2022   LDLDIRECT 105.0 07/13/2015   LDLCALC 59 01/30/2022   ALT 10 05/29/2022   AST 21 05/29/2022   NA 137 05/29/2022   K 4.0 05/29/2022   CL 109 05/29/2022   CREATININE 1.22  05/29/2022   BUN 21 05/29/2022   CO2 22 05/29/2022   TSH 0.852 05/29/2022   PSA 0.46 01/21/2015   HGBA1C 6.2 01/30/2022   MICROALBUR <0.7 07/26/2021     Assessment & Plan:    See Problem List for Assessment and Plan of chronic medical problems.

## 2022-07-31 ENCOUNTER — Ambulatory Visit: Payer: BC Managed Care – PPO | Admitting: Internal Medicine

## 2022-07-31 VITALS — BP 126/78 | HR 96 | Temp 98.4°F | Ht 72.0 in | Wt 319.0 lb

## 2022-07-31 DIAGNOSIS — E1141 Type 2 diabetes mellitus with diabetic mononeuropathy: Secondary | ICD-10-CM

## 2022-07-31 DIAGNOSIS — E7849 Other hyperlipidemia: Secondary | ICD-10-CM

## 2022-07-31 DIAGNOSIS — E039 Hypothyroidism, unspecified: Secondary | ICD-10-CM | POA: Diagnosis not present

## 2022-07-31 DIAGNOSIS — I10 Essential (primary) hypertension: Secondary | ICD-10-CM

## 2022-07-31 LAB — POCT GLYCOSYLATED HEMOGLOBIN (HGB A1C)
HbA1c POC (<> result, manual entry): 5.7 % (ref 4.0–5.6)
HbA1c, POC (controlled diabetic range): 5.7 % (ref 0.0–7.0)
HbA1c, POC (prediabetic range): 5.7 % (ref 5.7–6.4)
Hemoglobin A1C: 5.7 % — AB (ref 4.0–5.6)

## 2022-07-31 NOTE — Assessment & Plan Note (Signed)
Chronic Regular exercise and healthy diet encouraged LDL controlled Continue pravastatin 40 mg daily

## 2022-07-31 NOTE — Assessment & Plan Note (Addendum)
Chronic   Lab Results  Component Value Date   HGBA1C 6.2 01/30/2022   Sugars well controlled Check A1c - 5.7% Continue Ozempic 2 mg weekly Stressed regular exercise, diabetic diet

## 2022-07-31 NOTE — Assessment & Plan Note (Signed)
Chronic Blood pressure well-controlled Continue metoprolol XL 150 mg daily, amlodipine-benazepril to 5-40 mg daily Continue to monitor BP at home

## 2022-07-31 NOTE — Addendum Note (Signed)
Addended by: Marcina Millard on: 07/31/2022 10:06 AM   Modules accepted: Orders

## 2022-07-31 NOTE — Assessment & Plan Note (Signed)
Chronic Euthyroid Last TSH within normal range Continue levothyroxine 200 mcg daily

## 2022-08-13 DIAGNOSIS — G4733 Obstructive sleep apnea (adult) (pediatric): Secondary | ICD-10-CM | POA: Diagnosis not present

## 2022-09-05 ENCOUNTER — Ambulatory Visit: Payer: BC Managed Care – PPO | Admitting: Gastroenterology

## 2022-09-12 DIAGNOSIS — G4733 Obstructive sleep apnea (adult) (pediatric): Secondary | ICD-10-CM | POA: Diagnosis not present

## 2022-09-13 DIAGNOSIS — G4733 Obstructive sleep apnea (adult) (pediatric): Secondary | ICD-10-CM | POA: Diagnosis not present

## 2022-10-13 DIAGNOSIS — G4733 Obstructive sleep apnea (adult) (pediatric): Secondary | ICD-10-CM | POA: Diagnosis not present

## 2022-11-01 DIAGNOSIS — J309 Allergic rhinitis, unspecified: Secondary | ICD-10-CM | POA: Diagnosis not present

## 2022-11-12 DIAGNOSIS — G4733 Obstructive sleep apnea (adult) (pediatric): Secondary | ICD-10-CM | POA: Diagnosis not present

## 2022-11-22 DIAGNOSIS — N3001 Acute cystitis with hematuria: Secondary | ICD-10-CM | POA: Diagnosis not present

## 2022-11-27 ENCOUNTER — Other Ambulatory Visit: Payer: Self-pay | Admitting: Internal Medicine

## 2022-12-13 DIAGNOSIS — G4733 Obstructive sleep apnea (adult) (pediatric): Secondary | ICD-10-CM | POA: Diagnosis not present

## 2022-12-20 ENCOUNTER — Encounter (INDEPENDENT_AMBULATORY_CARE_PROVIDER_SITE_OTHER): Payer: Self-pay

## 2022-12-20 DIAGNOSIS — G4733 Obstructive sleep apnea (adult) (pediatric): Secondary | ICD-10-CM | POA: Diagnosis not present

## 2022-12-28 ENCOUNTER — Other Ambulatory Visit: Payer: Self-pay | Admitting: Internal Medicine

## 2023-01-04 ENCOUNTER — Other Ambulatory Visit: Payer: Self-pay | Admitting: Internal Medicine

## 2023-01-09 DIAGNOSIS — H25013 Cortical age-related cataract, bilateral: Secondary | ICD-10-CM | POA: Diagnosis not present

## 2023-01-09 DIAGNOSIS — H2513 Age-related nuclear cataract, bilateral: Secondary | ICD-10-CM | POA: Diagnosis not present

## 2023-01-09 DIAGNOSIS — E119 Type 2 diabetes mellitus without complications: Secondary | ICD-10-CM | POA: Diagnosis not present

## 2023-01-09 LAB — HM DIABETES EYE EXAM

## 2023-01-10 ENCOUNTER — Encounter: Payer: Self-pay | Admitting: Internal Medicine

## 2023-01-12 ENCOUNTER — Other Ambulatory Visit: Payer: Self-pay | Admitting: Internal Medicine

## 2023-01-13 DIAGNOSIS — G4733 Obstructive sleep apnea (adult) (pediatric): Secondary | ICD-10-CM | POA: Diagnosis not present

## 2023-01-30 ENCOUNTER — Encounter: Payer: Self-pay | Admitting: Internal Medicine

## 2023-01-30 NOTE — Patient Instructions (Addendum)
    Flu immunization administered today.     Blood work was ordered.   The lab is on the first floor.    Medications changes include :   stop amlodipine-benazepril and metoprolol 150 mg daily.      Start benazepril 40 mg daily and metoprolol 200 mg daily      Return in about 6 months (around 07/31/2023) for Physical Exam.

## 2023-01-30 NOTE — Progress Notes (Unsigned)
Subjective:    Patient ID: Patrick Butler, male    DOB: [birth year], 62 y.o.   MRN: [id number]     HPI Patrick Butler is here for follow up of his chronic medical problems.  BP at home 84/65 -135/91 - frequent low BP readings - feels lightheaded at times.    Medications and allergies reviewed with patient and updated if appropriate.  Current Outpatient Medications on File Prior to Visit  Medication Sig Dispense Refill   amLODipine-benazepril (LOTREL) 5-40 MG capsule TAKE 1 CAPSULE BY MOUTH DAILY 90 capsule 1   aspirin EC 81 MG tablet Take 81 mg by mouth every evening.      fluticasone (FLONASE) 50 MCG/ACT nasal spray Place 2 sprays into the nose as directed. At bedtime (Patient taking differently: Place 2 sprays into the nose at bedtime.) 16 g 5   ketoconazole (NIZORAL) 2 % shampoo Apply 1 application topically 2 (two) times a week.     levothyroxine (SYNTHROID) 200 MCG tablet TAKE 1 TABLET BY MOUTH DAILY BEFORE BREAKFAST 90 tablet 1   LORazepam (ATIVAN) 0.5 MG tablet TAKE 1 TABLET BY MOUTH DAILY AS NEEDED FOR ANXIETY 30 tablet 0   metoprolol succinate (TOPROL-XL) 100 MG 24 hr tablet TAKE 1 TABLET BY MOUTH DAILY WITH A MEAL 90 tablet 2   metoprolol succinate (TOPROL-XL) 50 MG 24 hr tablet TAKE 1 TABLET BY MOUTH DAILY WITH 100MG  TO TOTAL 150MG  DAILY 90 tablet 3   Multiple Vitamins-Minerals (MULTIVITAMIN WITH MINERALS) tablet Take 1 tablet by mouth daily.      OZEMPIC, 2 MG/DOSE, 8 MG/3ML SOPN INJECT 2 MGS SUBCUTANEOUSLY ONCE WEEKLY 9 mL 1   pantoprazole (PROTONIX) 40 MG tablet TAKE 1 TABLET BY MOUTH DAILY 90 tablet 2   pravastatin (PRAVACHOL) 40 MG tablet TAKE 1 TABLET BY MOUTH DAILY 90 tablet 3   tadalafil (CIALIS) 5 MG tablet Take 5 mg by mouth daily.     No current facility-administered medications on file prior to visit.     Review of Systems  Constitutional:  Negative for fever.  Respiratory:  Negative for cough, shortness of breath and wheezing.   Cardiovascular:  Positive for  leg swelling (mild at times). Negative for chest pain and palpitations.  Neurological:  Positive for headaches (headaches, migraines). Negative for light-headedness.       Objective:   Vitals:   01/31/23 0931  BP: (!) 150/90  Pulse: 100  Temp: 98.1 F (36.7 C)  SpO2: 99%   BP Readings from Last 3 Encounters:  01/31/23 (!) 150/90  07/31/22 126/78  07/24/22 130/82   Wt Readings from Last 3 Encounters:  01/31/23 (!) 333 lb (151 kg)  07/31/22 (!) 319 lb (144.7 kg)  07/24/22 (!) 322 lb 4 oz (146.2 kg)   Body mass index is 45.16 kg/m.    Physical Exam Constitutional:      General: He is not in acute distress.    Appearance: Normal appearance. He is not ill-appearing.  HENT:     Head: Normocephalic and atraumatic.  Eyes:     Conjunctiva/sclera: Conjunctivae normal.  Cardiovascular:     Rate and Rhythm: Normal rate and regular rhythm.     Heart sounds: Normal heart sounds.  Pulmonary:     Effort: Pulmonary effort is normal. No respiratory distress.     Breath sounds: Normal breath sounds. No wheezing or rales.  Musculoskeletal:     Right lower leg: Edema (trace) present.     Left lower leg:  Edema (trace) present.  Skin:    General: Skin is warm and dry.     Findings: No rash.  Neurological:     Mental Status: He is alert. Mental status is at baseline.  Psychiatric:        Mood and Affect: Mood normal.        Lab Results  Component Value Date   WBC 10.1 05/29/2022   HGB 13.1 05/29/2022   HCT 40.4 05/29/2022   PLT 132 (L) 05/29/2022   GLUCOSE 111 (H) 05/29/2022   CHOL 132 01/30/2022   TRIG 159.0 (H) 01/30/2022   HDL 41.00 01/30/2022   LDLDIRECT 105.0 07/13/2015   LDLCALC 59 01/30/2022   ALT 10 05/29/2022   AST 21 05/29/2022   NA 137 05/29/2022   K 4.0 05/29/2022   CL 109 05/29/2022   CREATININE 1.22 05/29/2022   BUN 21 05/29/2022   CO2 22 05/29/2022   TSH 0.852 05/29/2022   PSA 0.46 01/21/2015   HGBA1C 5.7 (A) 07/31/2022   HGBA1C 5.7 07/31/2022    HGBA1C 5.7 07/31/2022   HGBA1C 5.7 07/31/2022   MICROALBUR <0.7 07/26/2021     Assessment & Plan:    See Problem List for Assessment and Plan of chronic medical problems.

## 2023-01-31 ENCOUNTER — Ambulatory Visit: Payer: BC Managed Care – PPO | Admitting: Internal Medicine

## 2023-01-31 VITALS — BP 136/76 | HR 100 | Temp 98.1°F | Ht 72.0 in | Wt 333.0 lb

## 2023-01-31 DIAGNOSIS — E039 Hypothyroidism, unspecified: Secondary | ICD-10-CM

## 2023-01-31 DIAGNOSIS — Z23 Encounter for immunization: Secondary | ICD-10-CM

## 2023-01-31 DIAGNOSIS — E1141 Type 2 diabetes mellitus with diabetic mononeuropathy: Secondary | ICD-10-CM | POA: Diagnosis not present

## 2023-01-31 DIAGNOSIS — I10 Essential (primary) hypertension: Secondary | ICD-10-CM | POA: Diagnosis not present

## 2023-01-31 DIAGNOSIS — F419 Anxiety disorder, unspecified: Secondary | ICD-10-CM

## 2023-01-31 DIAGNOSIS — K219 Gastro-esophageal reflux disease without esophagitis: Secondary | ICD-10-CM

## 2023-01-31 DIAGNOSIS — E7849 Other hyperlipidemia: Secondary | ICD-10-CM

## 2023-01-31 DIAGNOSIS — Z7985 Long-term (current) use of injectable non-insulin antidiabetic drugs: Secondary | ICD-10-CM

## 2023-01-31 LAB — TSH: TSH: 1.16 u[IU]/mL (ref 0.35–5.50)

## 2023-01-31 LAB — COMPREHENSIVE METABOLIC PANEL
ALT: 18 U/L (ref 0–53)
AST: 18 U/L (ref 0–37)
Albumin: 4 g/dL (ref 3.5–5.2)
Alkaline Phosphatase: 63 U/L (ref 39–117)
BUN: 19 mg/dL (ref 6–23)
CO2: 28 mEq/L (ref 19–32)
Calcium: 9.5 mg/dL (ref 8.4–10.5)
Chloride: 104 mEq/L (ref 96–112)
Creatinine, Ser: 1.01 mg/dL (ref 0.40–1.50)
GFR: 78.73 mL/min (ref >60.00)
Glucose, Bld: 108 mg/dL — ABNORMAL HIGH (ref 70–99)
Potassium: 4.6 mEq/L (ref 3.5–5.1)
Sodium: 138 mEq/L (ref 135–145)
Total Bilirubin: 0.5 mg/dL (ref 0.2–1.2)
Total Protein: 7.5 g/dL (ref 6.0–8.3)

## 2023-01-31 LAB — MICROALBUMIN / CREATININE URINE RATIO
Creatinine,U: 65.5 mg/dL
Microalb Creat Ratio: 1.1 mg/g (ref 0.0–30.0)
Microalb, Ur: <0.7 mg/dL (ref 0.0–1.9)

## 2023-01-31 LAB — LIPID PANEL
Cholesterol: 153 mg/dL (ref 0–200)
HDL: 44.4 mg/dL (ref >39.00)
LDL Cholesterol: 64 mg/dL (ref 0–99)
NonHDL: 108.96
Total CHOL/HDL Ratio: 3
Triglycerides: 225 mg/dL — ABNORMAL HIGH (ref 0.0–149.0)
VLDL: 45 mg/dL — ABNORMAL HIGH (ref 0.0–40.0)

## 2023-01-31 LAB — HEMOGLOBIN A1C: Hgb A1c MFr Bld: 6 % (ref 4.6–6.5)

## 2023-01-31 MED ORDER — METOPROLOL SUCCINATE ER 200 MG PO TB24: 200.0000 mg | ORAL_TABLET | Freq: Every day | ORAL | 5 refills | Status: DC

## 2023-01-31 MED ORDER — BENAZEPRIL HCL 40 MG PO TABS: 40.0000 mg | ORAL_TABLET | Freq: Every day | ORAL | 5 refills | Status: DC

## 2023-01-31 NOTE — Assessment & Plan Note (Addendum)
Chronic Blood pressure well-controlled, but appears overcontrolled at times - frequent low readings - I wonder how accurate his BP cuff is and if it fits him correctly HR always on high side cmp Continue metoprolol XL  - increase to 200 mg daily, continue benazepril 40 mg daily Stop amlodipine Continue to monitor BP at home - may need to come in for a nurse check for BP - advised to bring BP cuff

## 2023-01-31 NOTE — Assessment & Plan Note (Signed)
Chronic Controlled, Stable Continue lorazepam 0.5 mg daily as needed, which is infrequent

## 2023-01-31 NOTE — Assessment & Plan Note (Signed)
Chronic Euthyroid Last TSH within normal range Continue levothyroxine 200 mcg daily

## 2023-01-31 NOTE — Assessment & Plan Note (Signed)
Chronic Regular exercise and healthy diet encouraged Lab Results  Component Value Date   LDLCALC 59 01/30/2022   Check lipids Continue pravastatin 40 mg daily

## 2023-01-31 NOTE — Assessment & Plan Note (Signed)
Chronic   Lab Results  Component Value Date   HGBA1C 5.7 (A) 07/31/2022   HGBA1C 5.7 07/31/2022   HGBA1C 5.7 07/31/2022   HGBA1C 5.7 07/31/2022   Sugars well controlled Check A1c  Continue Ozempic 2 mg weekly Stressed regular exercise, diabetic diet

## 2023-01-31 NOTE — Assessment & Plan Note (Signed)
Chronic GERD controlled Continue pantoprazole 40 mg daily

## 2023-02-01 ENCOUNTER — Other Ambulatory Visit: Payer: Self-pay | Admitting: Internal Medicine

## 2023-02-06 DIAGNOSIS — R350 Frequency of micturition: Secondary | ICD-10-CM | POA: Diagnosis not present

## 2023-02-06 DIAGNOSIS — R300 Dysuria: Secondary | ICD-10-CM | POA: Diagnosis not present

## 2023-02-06 DIAGNOSIS — N3001 Acute cystitis with hematuria: Secondary | ICD-10-CM | POA: Diagnosis not present

## 2023-02-12 DIAGNOSIS — G4733 Obstructive sleep apnea (adult) (pediatric): Secondary | ICD-10-CM | POA: Diagnosis not present

## 2023-02-23 IMAGING — MR MR CERVICAL SPINE W/O CM
5 series · 38 of 48 positions shown · non-contrast
Comparison: 07/01/2015

CLINICAL DATA: Neck pain radiating from between the scapula a over
the skull into the eyes for 16 years. History of throat cancer.

EXAM:
MRI CERVICAL SPINE WITHOUT CONTRAST
TECHNIQUE: Multiplanar, multisequence MR imaging of the cervical spine was
performed. No intravenous contrast was administered.

[Series 5: T2 · sagittal · 3.0mm · 0.66mm/px · 6 of 17 slices shown (1 of 2)]
[im 1/17]
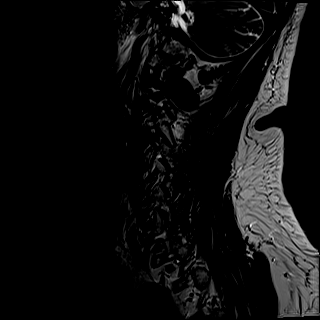
[im 4/17]
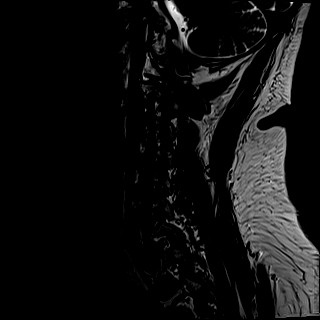
[im 7/17]
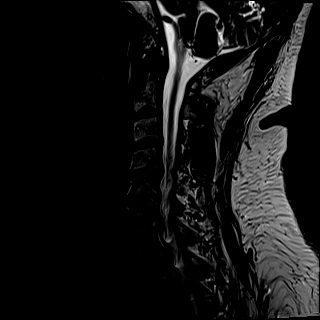
[im 10/17]
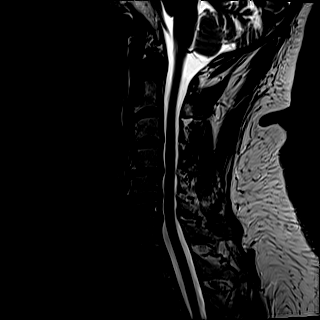
[im 13/17]
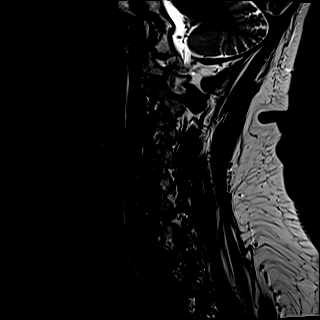
[im 17/17]
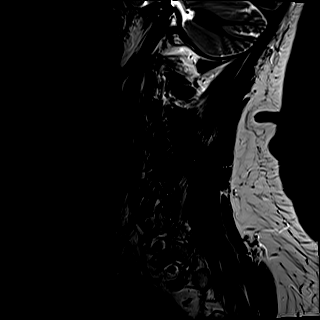

[Series 6: T1 · sagittal · 3.0mm · 0.82mm/px · 7 of 17 slices shown]
[im 1/17]
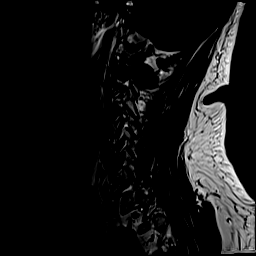
[im 3/17]
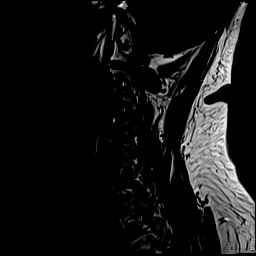
[im 6/17]
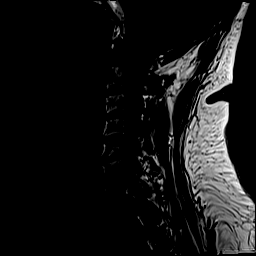
[im 9/17]
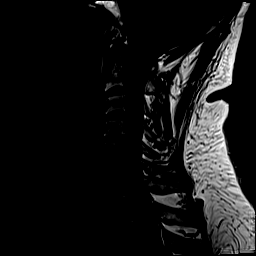
[im 11/17]
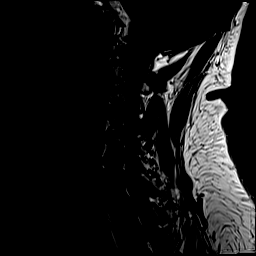
[im 14/17]
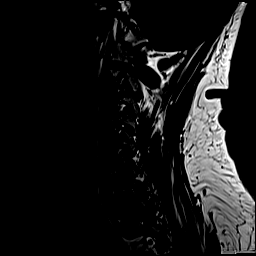
[im 17/17]
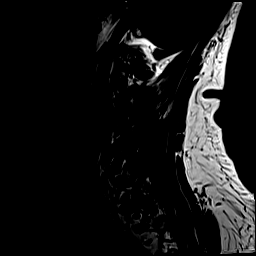

[Series 7: STIR · sagittal · 3.0mm · 0.41mm/px · 7 of 17 slices shown]
[im 1/17]
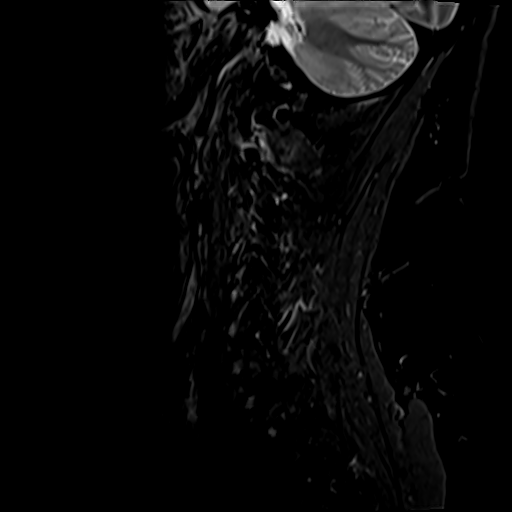
[im 3/17]
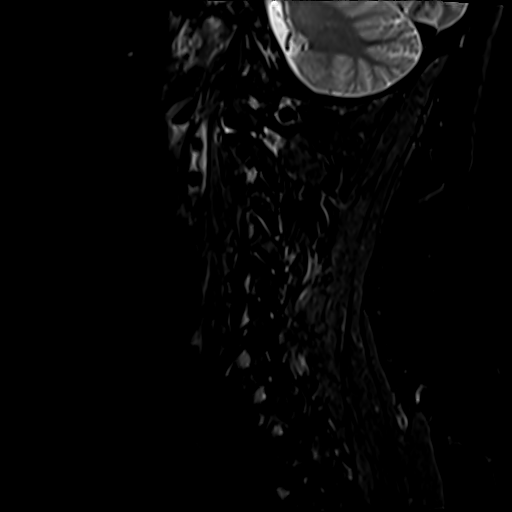
[im 6/17]
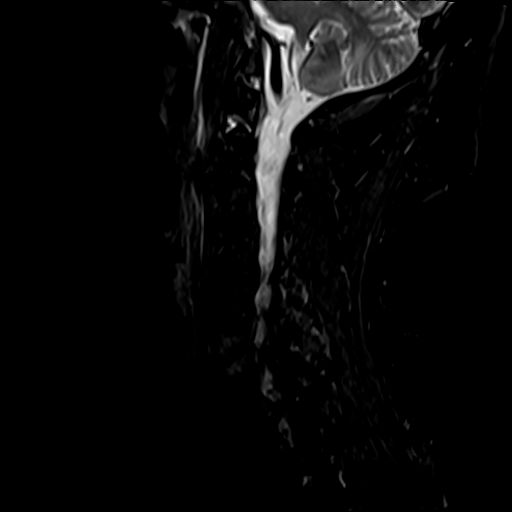
[im 9/17]
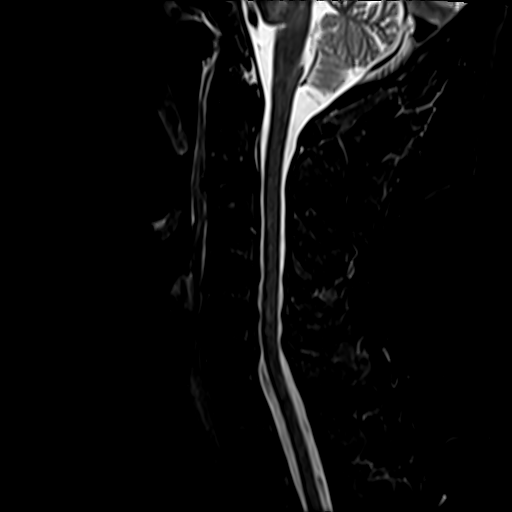
[im 11/17]
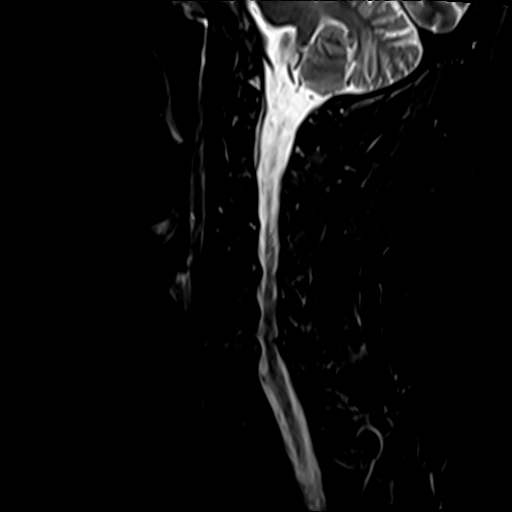
[im 14/17]
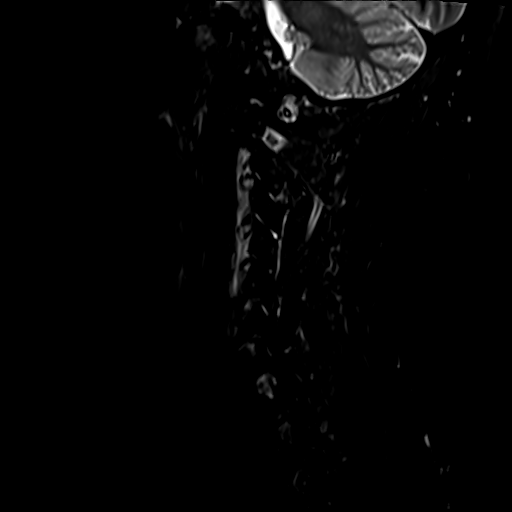
[im 17/17]
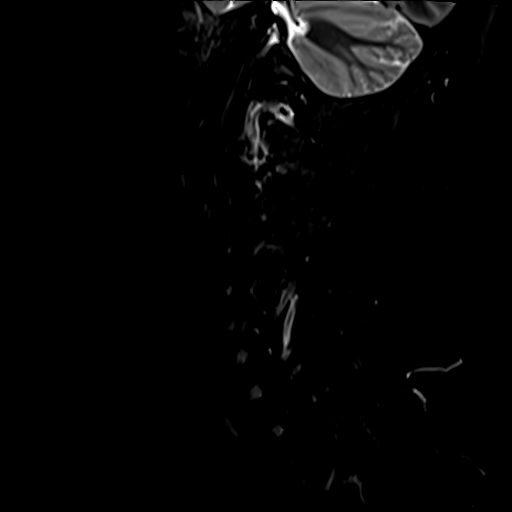

[Series 8: T2 · axial · 3.0mm · 0.62mm/px · z∈[-77,+37]mm · 10 of 36 slices shown (2 of 2)]
[im 1/36]
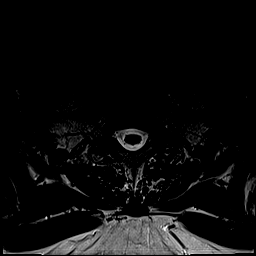
[im 3/36]
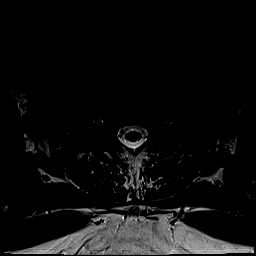
[im 6/36]
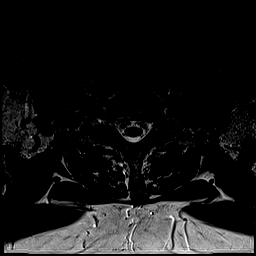
[im 9/36]
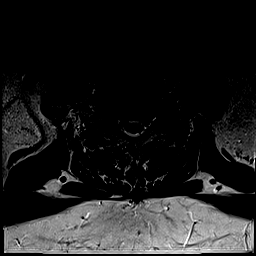
[im 11/36]
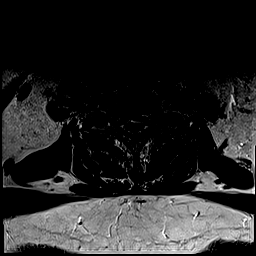
[im 17/36]
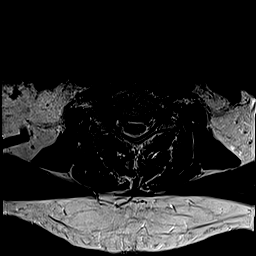
[im 19/36]
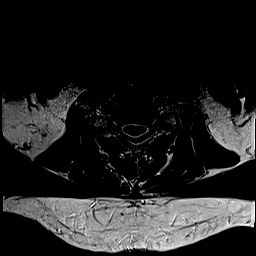
[im 25/36]
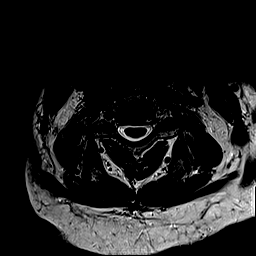
[im 30/36]
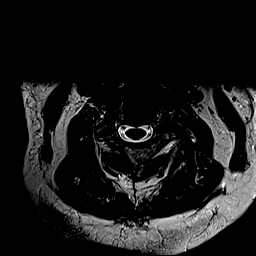
[im 36/36]
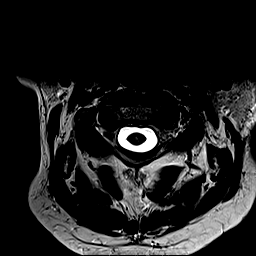

[Series 9: GRE · axial · 3.0mm · 0.42mm/px · z∈[-77,+37]mm · 8 of 36 slices shown]
[im 1/36]
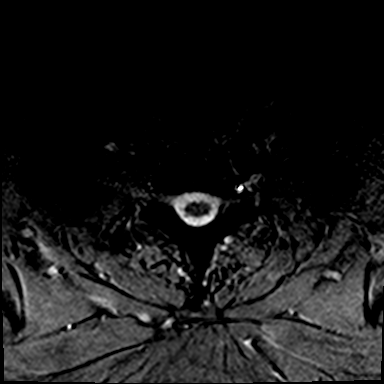
[im 6/36]
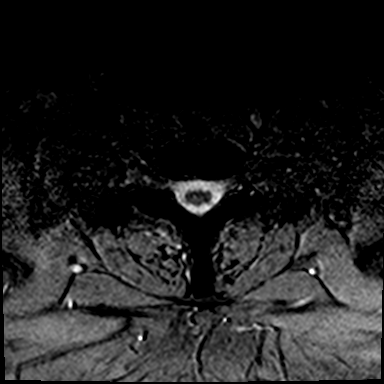
[im 11/36]
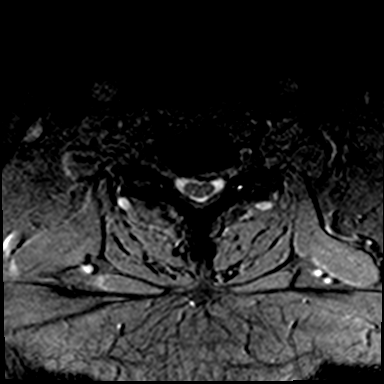
[im 17/36]
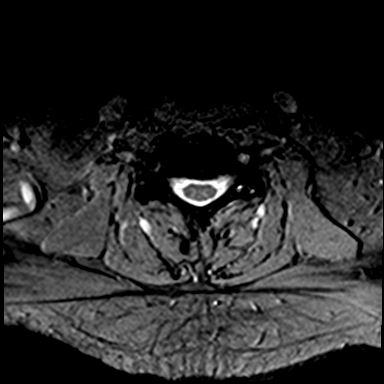
[im 19/36]
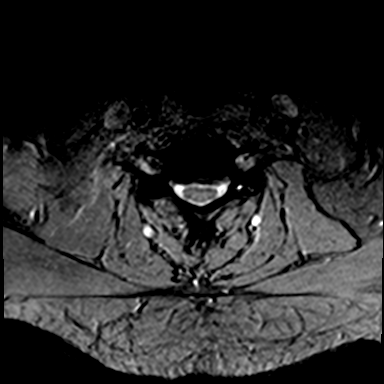
[im 25/36]
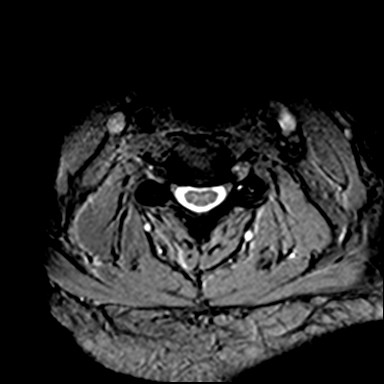
[im 30/36]
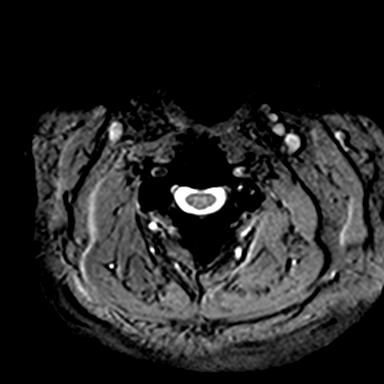
[im 36/36]
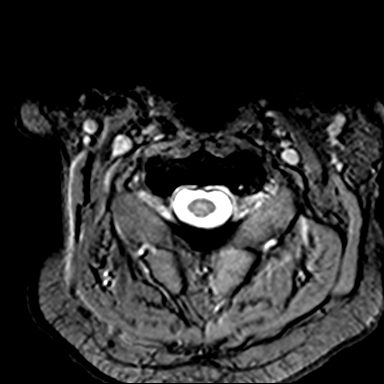

[38 of 48 positions shown; findings below may reference images not displayed]

FINDINGS: Alignment: Straightening of the midcervical spine

Vertebrae: No fracture, evidence of discitis, or bone lesion.

Cord: Normal signal and morphology.

Posterior Fossa, vertebral arteries, paraspinal tissues: Absent flow
in the non dominant left vertebral artery which is also seen in 2246

Disc levels:

C2-3: Unremarkable.

C3-4: Annular fissure in the central disc.

C4-5: Disc narrowing and bulging with uncovertebral ridging.
Biforaminal impingement

C5-6: Disc narrowing and bulging with uncovertebral ridging.
Biforaminal impingement. Mild spinal stenosis

C6-7: Disc narrowing and bulging with upward pointing protrusion
that is noncompressive. Disc material and uncovertebral ridging
causes right more than left foraminal impingement. The left foramen
is the least affected of the describe stenotic foramina.

C7-T1: Negative
IMPRESSION: Multilevel disc degeneration similar to 2246. Bilateral foraminal
impingement is again seen at C4-5 to C6-7.

## 2023-03-05 ENCOUNTER — Other Ambulatory Visit: Payer: Self-pay | Admitting: Internal Medicine

## 2023-03-15 DIAGNOSIS — G4733 Obstructive sleep apnea (adult) (pediatric): Secondary | ICD-10-CM | POA: Diagnosis not present

## 2023-03-22 DIAGNOSIS — R051 Acute cough: Secondary | ICD-10-CM | POA: Diagnosis not present

## 2023-03-22 DIAGNOSIS — R0981 Nasal congestion: Secondary | ICD-10-CM | POA: Diagnosis not present

## 2023-03-22 DIAGNOSIS — J019 Acute sinusitis, unspecified: Secondary | ICD-10-CM | POA: Diagnosis not present

## 2023-03-25 ENCOUNTER — Other Ambulatory Visit: Payer: Self-pay | Admitting: Internal Medicine

## 2023-03-29 DIAGNOSIS — G4733 Obstructive sleep apnea (adult) (pediatric): Secondary | ICD-10-CM | POA: Diagnosis not present

## 2023-04-08 DIAGNOSIS — R350 Frequency of micturition: Secondary | ICD-10-CM | POA: Diagnosis not present

## 2023-04-08 DIAGNOSIS — N3001 Acute cystitis with hematuria: Secondary | ICD-10-CM | POA: Diagnosis not present

## 2023-04-08 DIAGNOSIS — R300 Dysuria: Secondary | ICD-10-CM | POA: Diagnosis not present

## 2023-04-08 DIAGNOSIS — N3000 Acute cystitis without hematuria: Secondary | ICD-10-CM | POA: Diagnosis not present

## 2023-04-14 DIAGNOSIS — G4733 Obstructive sleep apnea (adult) (pediatric): Secondary | ICD-10-CM | POA: Diagnosis not present

## 2023-05-09 DIAGNOSIS — N401 Enlarged prostate with lower urinary tract symptoms: Secondary | ICD-10-CM | POA: Diagnosis not present

## 2023-05-09 DIAGNOSIS — R3912 Poor urinary stream: Secondary | ICD-10-CM | POA: Diagnosis not present

## 2023-05-16 DIAGNOSIS — R3912 Poor urinary stream: Secondary | ICD-10-CM | POA: Diagnosis not present

## 2023-05-16 DIAGNOSIS — N3281 Overactive bladder: Secondary | ICD-10-CM | POA: Diagnosis not present

## 2023-05-16 DIAGNOSIS — N5201 Erectile dysfunction due to arterial insufficiency: Secondary | ICD-10-CM | POA: Diagnosis not present

## 2023-05-16 DIAGNOSIS — N401 Enlarged prostate with lower urinary tract symptoms: Secondary | ICD-10-CM | POA: Diagnosis not present

## 2023-06-06 DIAGNOSIS — R350 Frequency of micturition: Secondary | ICD-10-CM | POA: Diagnosis not present

## 2023-06-06 DIAGNOSIS — R3915 Urgency of urination: Secondary | ICD-10-CM | POA: Diagnosis not present

## 2023-06-19 ENCOUNTER — Other Ambulatory Visit: Payer: Self-pay | Admitting: Internal Medicine

## 2023-06-19 ENCOUNTER — Encounter (HOSPITAL_BASED_OUTPATIENT_CLINIC_OR_DEPARTMENT_OTHER): Payer: Self-pay | Admitting: Pulmonary Disease

## 2023-06-19 ENCOUNTER — Ambulatory Visit (HOSPITAL_BASED_OUTPATIENT_CLINIC_OR_DEPARTMENT_OTHER): Payer: BC Managed Care – PPO | Admitting: Pulmonary Disease

## 2023-06-19 VITALS — BP 142/86 | HR 98 | Wt 344.4 lb

## 2023-06-19 DIAGNOSIS — G4733 Obstructive sleep apnea (adult) (pediatric): Secondary | ICD-10-CM | POA: Diagnosis not present

## 2023-06-19 NOTE — Patient Instructions (Signed)
CPAP has  a mask leak , otherwise working well

## 2023-06-19 NOTE — Progress Notes (Signed)
   Subjective:    Patient ID: Patrick Butler, male    DOB: 1960-08-25, 63 y.o.   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   PMH -  03/2020 dilation of esophageal stricture  hypothyroidism, type 2 diabetes, hyperlipidemia, tonsil cancer Flomax caused syncopal episode  The patient, with a history of sleep apnea, presents for a regular follow-up visit. He reports some issues with his new CPAP machine, including a leak and a message to reset the mask. Despite these issues, the patient continues to use the machine regularly and even has an old machine for travel. The patient also mentions frequent waking up at night, possibly due to shoulder pain. He had previously been prescribed Flomax, but has stopped taking it. The patient also discusses work and potential retirement plans, indicating a level of stress and uncertainty.  CPAP download was reviewed which showed reasonable control of events with AHI as developed 7/hour on auto settings 10 to 16 cm with average pressure of 11.5 and maximum pressure of 13 cm.  He has a large leak which is persistent on certain nights events go as high as 30/hour.  He is very compliant and CPAP has certainly helped improve his daytime somnolence and fatigue   Significant tests/ events reviewed NPSG 08/2011:  AHI 21/hr, nasal cpap titrated to 16cm     Review of Systems neg for any significant sore throat, dysphagia, itching, sneezing, nasal congestion or excess/ purulent secretions, fever, chills, sweats, unintended wt loss, pleuritic or exertional cp, hempoptysis, orthopnea pnd or change in chronic leg swelling. Also denies presyncope, palpitations, heartburn, abdominal pain, nausea, vomiting, diarrhea or change in bowel or urinary habits, dysuria,hematuria, rash, arthralgias, visual complaints, headache, numbness weakness  or ataxia.     Objective:   Physical Exam   Gen. Pleasant, obese, in no distress ENT - no lesions, no post nasal drip Neck: No JVD, no thyromegaly, no carotid bruits Lungs: no use of accessory muscles, no dullness to percussion, decreased without rales or rhonchi  Cardiovascular: Rhythm regular, heart sounds  normal, no murmurs or gallops, no peripheral edema Musculoskeletal: No deformities, no cyanosis or clubbing , no tremors       Assessment & Plan:    Obstructive Sleep Apnea Chronic condition managed with CPAP therapy. Reports issues with the new CPAP machine, including frequent mask leaks possibly due to stretched headbands. Prefers nasal pillow mask. Despite issues, compliant with therapy and has a travel CPAP machine. Experiencing billing issues with the new supplier. Discussed benefits of trying a different mask and importance of addressing leaks for effective therapy. - Renew CPAP supplies - Address billing issues with the supplier - Consider trying a different type of mask if leaks persist -Do not want to increase the pressure without fixing the leak first  General Health Maintenance Maintaining CDL and actively involved in his business. Considering retirement but concerned about impact on employees. Building a new house and shop to stay active post-retirement. - Encourage staying active and engaged in hobbies and work.

## 2023-06-26 DIAGNOSIS — R350 Frequency of micturition: Secondary | ICD-10-CM | POA: Diagnosis not present

## 2023-06-26 DIAGNOSIS — R3912 Poor urinary stream: Secondary | ICD-10-CM | POA: Diagnosis not present

## 2023-06-26 DIAGNOSIS — N401 Enlarged prostate with lower urinary tract symptoms: Secondary | ICD-10-CM | POA: Diagnosis not present

## 2023-07-01 ENCOUNTER — Other Ambulatory Visit: Payer: Self-pay | Admitting: Internal Medicine

## 2023-07-02 ENCOUNTER — Other Ambulatory Visit: Payer: Self-pay | Admitting: Urology

## 2023-07-03 ENCOUNTER — Encounter: Payer: Self-pay | Admitting: Internal Medicine

## 2023-07-03 ENCOUNTER — Other Ambulatory Visit: Payer: Self-pay | Admitting: Internal Medicine

## 2023-07-04 ENCOUNTER — Encounter (HOSPITAL_BASED_OUTPATIENT_CLINIC_OR_DEPARTMENT_OTHER): Payer: Self-pay | Admitting: Pulmonary Disease

## 2023-07-04 ENCOUNTER — Encounter: Payer: Self-pay | Admitting: Gastroenterology

## 2023-07-04 NOTE — Telephone Encounter (Signed)
 FYI

## 2023-07-11 NOTE — Progress Notes (Addendum)
 PCP - Cheryll Cockayne, MD LOV 01-31-23 epic Cardiologist - no Pulm- LOV 06-19-23 Cyril Mourning, MD  PPM/ICD -  Device Orders -  Rep Notified -   Chest x-ray - 05-29-22 epic EKG -  Stress Test -  ECHO - 2022 epic Cardiac Cath -   Sleep Study -  CPAP - yes  Fasting Blood Sugar - 93 Checks Blood Sugar occasional  OZEMPIC- LAST DOSE-Last dose July 08 2023  Blood Thinner Instructions: Aspirin Instructions:81 MG ASA  last dose 07-08-33  ERAS Protcol - PRE-SURGERY n/a   COVID vaccine -yes  Activity--Able to complete Adl's and climb a flight of stairs without CP or SOB  Anesthesia review: HTN, DMII, OSA, BP 187/113, 183/116 at preop. Shanda Bumps Ward PA-C aware. Pt States, he runs high when at the doctor. Pt. Has an app on his phone that connects to his BP machine and he presented his readings to me. 114/76, 117/78, 148/94. Pt advised to Check at home this afternoon and contact PCP if still elevated.Pt. had taken his BP meds the am of preop. I also advised pt. To limit salt and processed food over the weekend and drink water.  Patient denies shortness of breath, fever, cough and chest pain at PAT appointment   All instructions explained to the patient, with a verbal understanding of the material. Patient agrees to go over the instructions while at home for a better understanding. Patient also instructed to self quarantine after being tested for COVID-19. The opportunity to ask questions was provided.

## 2023-07-11 NOTE — Patient Instructions (Addendum)
 SURGICAL WAITING ROOM VISITATION  Patients having surgery or a procedure may have no more than 2 support people in the waiting area - these visitors may rotate.    Children under the age of 46 must have an adult with them who is not the patient.  Due to an increase in RSV and influenza rates and associated hospitalizations, children ages 60 and under may not visit patients in [facility] hospitals.  Visitors with respiratory illnesses are discouraged from visiting and should remain at home.  If the patient needs to stay at the hospital during part of their recovery, the visitor guidelines for inpatient rooms apply. Pre-op nurse will coordinate an appropriate time for 1 support person to accompany patient in pre-op.  This support person may not rotate.    Please refer to the [facility] website for the visitor guidelines for Inpatients (after your surgery is over and you are in a regular room).       Your procedure is scheduled on: 07-15-23   Report to [facility] Main Entrance    Report to admitting at      0945 AM   Call this number if you have problems the morning of surgery [phone]   Do not eat food :After Midnight.   After Midnight you may have the following liquids until _0600_____ AM/ DAY OF SURGERY THEN NOTHING BY MOUTH  Water Non-Citrus Juices (without pulp, NO RED-Apple, White grape, White cranberry) Black Coffee (NO MILK/CREAM OR CREAMERS, sugar ok)  Clear Tea (NO MILK/CREAM OR CREAMERS, sugar ok) regular and decaf                             Plain Jell-O (NO RED)                                           Fruit ices (not with fruit pulp, NO RED)                                     Popsicles (NO RED)                                                               Sports drinks like Gatorade (NO RED)                             If you have questions, please contact your surgeon's office.   FOLLOW  ANY ADDITIONAL PRE OP INSTRUCTIONS YOU RECEIVED FROM  YOUR SURGEON'S OFFICE!!!     Oral Hygiene is also important to reduce your risk of infection.                                    Remember - BRUSH YOUR TEETH THE MORNING OF SURGERY WITH YOUR REGULAR TOOTHPASTE  DENTURES WILL BE REMOVED PRIOR TO SURGERY PLEASE DO NOT APPLY "Poly grip" OR ADHESIVES!!!   Do NOT smoke after Midnight   Stop  all vitamins and herbal supplements 7 days before surgery.   Take these medicines the morning of surgery with A SIP OF WATER: Prevastatin, pantoprazole, metoprolol, Ativan if needed, levothyroxine, tylenol if needed,   OZEMPIC HOLD ONE WEEK PRIOR TO SURGERY   Bring CPAP mask and tubing day of surgery.                              You may not have any metal on your body including hair pins, jewelry, and body piercing             Do not wear lotions, powders, perfumes/cologne, or deodorant .               Men may shave face and neck.   Do not bring valuables to the hospital. [facility] IS NOT             RESPONSIBLE   FOR VALUABLES.   Contacts, glasses, dentures or bridgework may not be worn into surgery.   Bring small overnight bag day of surgery.   DO NOT BRING YOUR HOME MEDICATIONS TO THE HOSPITAL. PHARMACY WILL DISPENSE MEDICATIONS LISTED ON YOUR MEDICATION LIST TO YOU DURING YOUR ADMISSION IN THE HOSPITAL!    Patients discharged on the day of surgery will not be allowed to drive home.  Someone NEEDS to stay with you for the first 24 hours after anesthesia.   Special Instructions: Bring a copy of your healthcare power of attorney and living will documents the day of surgery if you haven't scanned them before.              Please read over the following fact sheets you were given: IF YOU HAVE QUESTIONS ABOUT YOUR PRE-OP INSTRUCTIONS PLEASE CALL [phone]   IIf you test positive for Covid or have been in contact with anyone that has tested positive in the last 10 days please notify you surgeon.    [facility] - Preparing for  Surgery Before surgery, you can play an important role.  Because skin is not sterile, your skin needs to be as free of germs as possible.  You can reduce the number of germs on your skin by washing with CHG (chlorahexidine gluconate) soap before surgery.  CHG is an antiseptic cleaner which kills germs and bonds with the skin to continue killing germs even after washing. Please DO NOT use if you have an allergy to CHG or antibacterial soaps.  If your skin becomes reddened/irritated stop using the CHG and inform your nurse when you arrive at Short Stay. Do not shave (including legs and underarms) for at least 48 hours prior to the first CHG shower.  You may shave your face/neck. Please follow these instructions carefully:  1.  Shower with CHG Soap the night before surgery and the  morning of Surgery.  2.  If you choose to wash your hair, wash your hair first as usual with your  normal  shampoo.  3.  After you shampoo, rinse your hair and body thoroughly to remove the  shampoo.                           4.  Use CHG as you would any other liquid soap.  You can apply chg directly  to the skin and wash  Gently with a scrungie or clean washcloth.  5.  Apply the CHG Soap to your body ONLY FROM THE NECK DOWN.   Do not use on face/ open                           Wound or open sores. Avoid contact with eyes, ears mouth and genitals (private parts).                       Wash face,  Genitals (private parts) with your normal soap.             6.  Wash thoroughly, paying special attention to the area where your surgery  will be performed.  7.  Thoroughly rinse your body with warm water from the neck down.  8.  DO NOT shower/wash with your normal soap after using and rinsing off  the CHG Soap.                9.  Pat yourself dry with a clean towel.            10.  Wear clean pajamas.            11.  Place clean sheets on your bed the night of your first shower and do not  sleep with pets. Day  of Surgery : Do not apply any lotions/deodorants the morning of surgery.  Please wear clean clothes to the hospital/surgery center.  FAILURE TO FOLLOW THESE INSTRUCTIONS MAY RESULT IN THE CANCELLATION OF YOUR SURGERY PATIENT SIGNATURE_________________________________  NURSE SIGNATURE__________________________________  ________________________________________________________________________

## 2023-07-12 ENCOUNTER — Other Ambulatory Visit: Payer: Self-pay

## 2023-07-12 ENCOUNTER — Encounter: Payer: Self-pay | Admitting: Gastroenterology

## 2023-07-12 ENCOUNTER — Encounter (HOSPITAL_COMMUNITY)
Admission: RE | Admit: 2023-07-12 | Discharge: 2023-07-12 | Disposition: A | Discharge status: Home / Self Care | Payer: BC Managed Care – PPO | Source: Ambulatory Visit | Attending: Urology | Admitting: Urology

## 2023-07-12 ENCOUNTER — Encounter (HOSPITAL_COMMUNITY): Payer: Self-pay

## 2023-07-12 VITALS — HR 99 | Temp 97.6°F | Resp 16 | Ht 72.0 in | Wt 338.0 lb

## 2023-07-12 DIAGNOSIS — Z85818 Personal history of malignant neoplasm of other sites of lip, oral cavity, and pharynx: Secondary | ICD-10-CM | POA: Insufficient documentation

## 2023-07-12 DIAGNOSIS — G4730 Sleep apnea, unspecified: Secondary | ICD-10-CM | POA: Diagnosis not present

## 2023-07-12 DIAGNOSIS — Z7985 Long-term (current) use of injectable non-insulin antidiabetic drugs: Secondary | ICD-10-CM | POA: Diagnosis not present

## 2023-07-12 DIAGNOSIS — Z9884 Bariatric surgery status: Secondary | ICD-10-CM | POA: Insufficient documentation

## 2023-07-12 DIAGNOSIS — Z01818 Encounter for other preprocedural examination: Secondary | ICD-10-CM | POA: Insufficient documentation

## 2023-07-12 DIAGNOSIS — E1141 Type 2 diabetes mellitus with diabetic mononeuropathy: Secondary | ICD-10-CM | POA: Diagnosis not present

## 2023-07-12 DIAGNOSIS — Z923 Personal history of irradiation: Secondary | ICD-10-CM | POA: Diagnosis not present

## 2023-07-12 DIAGNOSIS — E039 Hypothyroidism, unspecified: Secondary | ICD-10-CM | POA: Diagnosis not present

## 2023-07-12 DIAGNOSIS — N400 Enlarged prostate without lower urinary tract symptoms: Secondary | ICD-10-CM | POA: Diagnosis not present

## 2023-07-12 DIAGNOSIS — I10 Essential (primary) hypertension: Secondary | ICD-10-CM | POA: Insufficient documentation

## 2023-07-12 DIAGNOSIS — Z9221 Personal history of antineoplastic chemotherapy: Secondary | ICD-10-CM | POA: Diagnosis not present

## 2023-07-12 HISTORY — DX: Headache, unspecified: R51.9

## 2023-07-12 LAB — GLUCOSE, CAPILLARY: Glucose-Capillary: 131 mg/dL — ABNORMAL HIGH (ref 70–99)

## 2023-07-12 LAB — BASIC METABOLIC PANEL
Anion gap: 9 (ref 5–15)
BUN: 20 mg/dL (ref 8–23)
CO2: 27 mmol/L (ref 22–32)
Calcium: 9.1 mg/dL (ref 8.9–10.3)
Chloride: 101 mmol/L (ref 98–111)
Creatinine, Ser: 1.08 mg/dL (ref 0.61–1.24)
GFR, Estimated: >60 mL/min (ref >60)
Glucose, Bld: 100 mg/dL — ABNORMAL HIGH (ref 70–99)
Potassium: 4.8 mmol/L (ref 3.5–5.1)
Sodium: 137 mmol/L (ref 135–145)

## 2023-07-12 LAB — CBC
HCT: 44.8 % (ref 39.0–52.0)
Hemoglobin: 14.1 g/dL (ref 13.0–17.0)
MCH: 28 pg (ref 26.0–34.0)
MCHC: 31.5 g/dL (ref 30.0–36.0)
MCV: 89.1 fL (ref 80.0–100.0)
Platelets: 171 10*3/uL (ref 150–400)
RBC: 5.03 MIL/uL (ref 4.22–5.81)
RDW: 14.7 % (ref 11.5–15.5)
WBC: 8.8 10*3/uL (ref 4.0–10.5)
nRBC: 0 % (ref 0.0–0.2)

## 2023-07-12 LAB — HEMOGLOBIN A1C
Hgb A1c MFr Bld: 5.7 % — ABNORMAL HIGH (ref 4.8–5.6)
Mean Plasma Glucose: 116.89 mg/dL

## 2023-07-12 NOTE — Anesthesia Preprocedure Evaluation (Addendum)
 Anesthesia Evaluation  Patient identified by MRN, date of birth, ID band Patient awake    Reviewed: Allergy & Precautions, NPO status , Patient's Chart, lab work & pertinent test results, reviewed documented beta blocker date and time   Airway Mallampati: IV  TM Distance: >3 FB Neck ROM: Full    Dental  (+) Edentulous Upper, Edentulous Lower   Pulmonary sleep apnea and Continuous Positive Airway Pressure Ventilation , former smoker Hx tonsillar ca s/p chemo and radiation 2005 Multiple esophageal dilations, Dls since then    Pulmonary exam normal breath sounds clear to auscultation       Cardiovascular hypertension (161/83 preop), Pt. on medications and Pt. on home beta blockers Normal cardiovascular exam Rhythm:Regular Rate:Normal     Neuro/Psych  Headaches PSYCHIATRIC DISORDERS Anxiety        GI/Hepatic Neg liver ROS,GERD  Medicated and Controlled,,Hx gastric bypass   Endo/Other  diabetes, Well Controlled, Type 2Hypothyroidism  Class 3 obesityBMI 46  Renal/GU negative Renal ROS  negative genitourinary   Musculoskeletal  (+) Arthritis , Osteoarthritis,    Abdominal  (+) + obese  Peds  Hematology negative hematology ROS (+)   Anesthesia Other Findings Ozempic LD:   Reproductive/Obstetrics negative OB ROS                             Anesthesia Physical Anesthesia Plan  ASA: 3  Anesthesia Plan: General   Post-op Pain Management: Tylenol PO (pre-op)*   Induction: Intravenous  PONV Risk Score and Plan: 2 and Ondansetron, Dexamethasone, Midazolam and Treatment may vary due to age or medical condition  Airway Management Planned: LMA  Additional Equipment: None  Intra-op Plan:   Post-operative Plan: Extubation in OR  Informed Consent: I have reviewed the patients History and Physical, chart, labs and discussed the procedure including the risks, benefits and alternatives for the  proposed anesthesia with the patient or authorized representative who has indicated his/her understanding and acceptance.     Dental advisory given  Plan Discussed with: CRNA  Anesthesia Plan Comments: (Hx tonsillar ca s/p chemorad- has had multiple different anesthetics w/ easy intubations, MAC since then  Last airway note: Ventilation: Mask ventilation without difficulty Laryngoscope Size: Miller and 2 Grade View: Grade I Tube type: Oral Tube size: 7.0 mm Number of attempts: 1 )       Anesthesia Quick Evaluation

## 2023-07-12 NOTE — Progress Notes (Addendum)
 Anesthesia Chart Review   Case: [id number] Date/Time: 07/15/23 1145   Procedure: TURP (TRANSURETHRAL RESECTION OF PROSTATE) - 60 MINUTE CASE   Anesthesia type: General   Pre-op diagnosis: BENIGN PROSTATIC HYPERPLASIA   Location[facility] ROOM 05 / [facility]   Surgeons: Jannifer Hick, MD       DISCUSSION:63 y.o. former smoker with h/o HTN, hypothyroidism, sleep apnea with CPAP, DM II, s/p gastric bypass, tonsillar cancer s/o chemo/radiation greater than 15 years ago, BPH scheduled for above procedure 07/15/2023 with Dr. Jettie Pagan.     Pt with elevated BP at PAT visit, 187/113.  Pt reports he has white coat syndrome.  Reviewed home readings of 114/76, 117/78, 148/94.  He has taken his BP meds today.  Pt advised to continue to monitor at home.  Evaluate DOS.  BP Readings from Last 3 Encounters:  06/19/23 (!) 142/86  01/31/23 136/76  07/31/22 126/78    H/o tonsillar cancer greater than 15 years ago.  Has also undergone esophageal dilations several times due to esophageal stricture and dysphagia. Lat procedure 03/11/2020.   Pt has had several anesthesia events with our system, no anesthesia complications or trouble with intubation noted.  VS: Pulse 99   Temp 36.4 C (Oral)   Resp 16   Ht 6' (1.829 m)   Wt (!) 153.3 kg   SpO2 98%   BMI 45.84 kg/m   PROVIDERS: Pincus Sanes, MD is PCP    LABS: Labs reviewed: Acceptable for surgery. (all labs ordered are listed, but only abnormal results are displayed)  Labs Reviewed  HEMOGLOBIN A1C - Abnormal; Notable for the following components:      Result Value   Hgb A1c MFr Bld 5.7 (*)    All other components within normal limits  BASIC METABOLIC PANEL - Abnormal; Notable for the following components:   Glucose, Bld 100 (*)    All other components within normal limits  GLUCOSE, CAPILLARY - Abnormal; Notable for the following components:   Glucose-Capillary 131 (*)    All other components within normal limits  CBC      IMAGES:   EKG:   CV: Echo 02/15/2021 1. Left ventricular ejection fraction, by estimation, is 60 to 65%. The  left ventricle has normal function. The left ventricle has no regional  wall motion abnormalities. Left ventricular diastolic parameters are  consistent with Grade I diastolic  dysfunction (impaired relaxation).   2. Right ventricular systolic function is normal. The right ventricular  size is normal.   3. Left atrial size was moderately dilated.   4. The mitral valve is normal in structure. No evidence of mitral valve  regurgitation. No evidence of mitral stenosis.   5. The aortic valve is normal in structure. There is mild calcification  of the aortic valve. Aortic valve regurgitation is not visualized. No  aortic stenosis is present.   6. The inferior vena cava is normal in size with greater than 50%  respiratory variability, suggesting right atrial pressure of 3 mmHg.   Past Medical History:  Diagnosis Date   Anxiety    Arthritis    right knee   Cancer (HCC) 2005   tonsils/Chemo and radiation   Dysphagia 08/2014   Full dentures    GERD (gastroesophageal reflux disease)    Headache    History of chemotherapy    History of radiation therapy    neck   Hyperlipidemia    Hypertension    states under control with med., has been  on med. ~ 5 yr.   Hypothyroidism    Non-insulin dependent type 2 diabetes mellitus (HCC)    Posterior neck pain    Seasonal allergies    Sleep apnea    uses CPAP nightly    Past Surgical History:  Procedure Laterality Date   COLONOSCOPY  2012   COLONOSCOPY WITH PROPOFOL N/A 10/04/2015   Procedure: COLONOSCOPY WITH PROPOFOL;  Surgeon: Sherrilyn Rist, MD;  [facility];  Service: Gastroenterology;  Laterality: N/A;   DIRECT LARYNGOSCOPY N/A 08/20/2014   Procedure: DIRECT LARYNGOSCOPY;  Surgeon: Drema Halon, MD;  Location: [facility];  Service: ENT;  Laterality: N/A;   DIRECT LARYNGOSCOPY N/A  08/21/2016   Procedure: DIRECT LARYNGOSCOPY;  Surgeon: Drema Halon, MD;  Location: [facility] OR;  Service: ENT;  Laterality: N/A;   DIRECT LARYNGOSCOPY N/A 03/11/2020   Procedure: DIRECT LARYNGOSCOPY;  Surgeon: Drema Halon, MD;  Location: [facility];  Service: ENT;  Laterality: N/A;   ESOPHAGEAL DILATION N/A 03/11/2020   Procedure: INSERTION OF GUIDE WIRE FOLLOWED BY DILATION OVER GUIDE WIRE;  Surgeon: Drema Halon, MD;  Location: [facility];  Service: ENT;  Laterality: N/A;   ESOPHAGOSCOPY WITH DILITATION N/A 08/20/2014   Procedure: ESOPHAGOSCOPY WITH DILATATION;  Surgeon: Drema Halon, MD;  Location: [facility];  Service: ENT;  Laterality: N/A;   ESOPHAGOSCOPY WITH DILITATION N/A 08/21/2016   Procedure: ESOPHAGOSCOPY WITH DILITATION;  Surgeon: Drema Halon, MD;  Location: [facility] OR;  Service: ENT;  Laterality: N/A;   KNEE ARTHROPLASTY Right    KNEE ARTHROSCOPY Right    x 3   KNEE ARTHROSCOPY Left 10/28/2015   Procedure: ARTHROSCOPY KNEE;  Surgeon: Jodi Geralds, MD;  Location: [facility];  Service: Orthopedics;  Laterality: Left;  left knee partial medial meniscectomy and patellofemoral chondroplasty   LAPAROSCOPIC GASTRIC BYPASS  2004   LUMBAR LAMINECTOMY/DECOMPRESSION MICRODISCECTOMY  05/24/2011   Procedure: LUMBAR LAMINECTOMY/DECOMPRESSION MICRODISCECTOMY;  Surgeon: Carmela Hurt, MD;  Location: [facility];  Service: Neurosurgery;  Laterality: Right;  RIGHT L5-S1 Laminectomy, discectomy   LYMPHADENECTOMY Right    cervical   MULTIPLE TOOTH EXTRACTIONS     PORT-A-CATH REMOVAL     PORTACATH PLACEMENT     REPLACEMENT TOTAL KNEE Right 03/22/2004   SHOULDER ARTHROSCOPY Left    SHOULDER OPEN ROTATOR CUFF REPAIR Right     MEDICATIONS:  acetaminophen (TYLENOL) 500 MG tablet   acetaminophen (TYLENOL) 650 MG CR tablet   aspirin EC 81 MG tablet   benazepril (LOTENSIN) 40 MG tablet   Cyanocobalamin (B-12 PO)   fluticasone  (FLONASE) 50 MCG/ACT nasal spray   ketoconazole (NIZORAL) 2 % shampoo   levothyroxine (SYNTHROID) 200 MCG tablet   LORazepam (ATIVAN) 0.5 MG tablet   metoprolol (TOPROL-XL) 200 MG 24 hr tablet   Multiple Vitamins-Minerals (MULTIVITAMIN WITH MINERALS) tablet   OZEMPIC, 2 MG/DOSE, 8 MG/3ML SOPN   pantoprazole (PROTONIX) 40 MG tablet   polyethylene glycol (MIRALAX / GLYCOLAX) 17 g packet   pravastatin (PRAVACHOL) 40 MG tablet   Probiotic Product (PROBIOTIC PO)   tadalafil (CIALIS) 5 MG tablet   No current facility-administered medications for this encounter.   Jodell Cipro Ward, PA-C [facility] [phone]

## 2023-07-15 ENCOUNTER — Ambulatory Visit (HOSPITAL_COMMUNITY)
Admission: RE | Admit: 2023-07-15 | Discharge: 2023-07-16 | Disposition: A | Discharge status: Home / Self Care | Payer: BC Managed Care – PPO | Source: Ambulatory Visit | Attending: Urology | Admitting: Urology

## 2023-07-15 ENCOUNTER — Encounter (HOSPITAL_COMMUNITY)
Admission: RE | Disposition: A | Discharge status: Home / Self Care | Payer: Self-pay | Source: Ambulatory Visit | Attending: Urology

## 2023-07-15 ENCOUNTER — Ambulatory Visit (HOSPITAL_COMMUNITY): Payer: Self-pay | Admitting: Certified Registered"

## 2023-07-15 ENCOUNTER — Encounter (HOSPITAL_COMMUNITY): Payer: Self-pay | Admitting: Urology

## 2023-07-15 ENCOUNTER — Other Ambulatory Visit: Payer: Self-pay

## 2023-07-15 DIAGNOSIS — Z79899 Other long term (current) drug therapy: Secondary | ICD-10-CM | POA: Diagnosis not present

## 2023-07-15 DIAGNOSIS — N400 Enlarged prostate without lower urinary tract symptoms: Secondary | ICD-10-CM | POA: Diagnosis present

## 2023-07-15 DIAGNOSIS — N401 Enlarged prostate with lower urinary tract symptoms: Secondary | ICD-10-CM | POA: Insufficient documentation

## 2023-07-15 DIAGNOSIS — Z6841 Body Mass Index (BMI) 40.0 and over, adult: Secondary | ICD-10-CM | POA: Diagnosis not present

## 2023-07-15 DIAGNOSIS — Z85818 Personal history of malignant neoplasm of other sites of lip, oral cavity, and pharynx: Secondary | ICD-10-CM | POA: Insufficient documentation

## 2023-07-15 DIAGNOSIS — K219 Gastro-esophageal reflux disease without esophagitis: Secondary | ICD-10-CM | POA: Insufficient documentation

## 2023-07-15 DIAGNOSIS — F419 Anxiety disorder, unspecified: Secondary | ICD-10-CM | POA: Insufficient documentation

## 2023-07-15 DIAGNOSIS — E039 Hypothyroidism, unspecified: Secondary | ICD-10-CM | POA: Insufficient documentation

## 2023-07-15 DIAGNOSIS — Z9221 Personal history of antineoplastic chemotherapy: Secondary | ICD-10-CM | POA: Diagnosis not present

## 2023-07-15 DIAGNOSIS — E119 Type 2 diabetes mellitus without complications: Secondary | ICD-10-CM | POA: Diagnosis not present

## 2023-07-15 DIAGNOSIS — N138 Other obstructive and reflux uropathy: Secondary | ICD-10-CM | POA: Diagnosis not present

## 2023-07-15 DIAGNOSIS — Z7984 Long term (current) use of oral hypoglycemic drugs: Secondary | ICD-10-CM | POA: Insufficient documentation

## 2023-07-15 DIAGNOSIS — M1990 Unspecified osteoarthritis, unspecified site: Secondary | ICD-10-CM | POA: Diagnosis not present

## 2023-07-15 DIAGNOSIS — Z79890 Hormone replacement therapy (postmenopausal): Secondary | ICD-10-CM | POA: Diagnosis not present

## 2023-07-15 DIAGNOSIS — R519 Headache, unspecified: Secondary | ICD-10-CM | POA: Diagnosis not present

## 2023-07-15 DIAGNOSIS — N320 Bladder-neck obstruction: Secondary | ICD-10-CM | POA: Diagnosis not present

## 2023-07-15 DIAGNOSIS — E6689 Other obesity not elsewhere classified: Secondary | ICD-10-CM | POA: Diagnosis not present

## 2023-07-15 DIAGNOSIS — I10 Essential (primary) hypertension: Secondary | ICD-10-CM | POA: Insufficient documentation

## 2023-07-15 DIAGNOSIS — G4730 Sleep apnea, unspecified: Secondary | ICD-10-CM | POA: Diagnosis not present

## 2023-07-15 DIAGNOSIS — E1141 Type 2 diabetes mellitus with diabetic mononeuropathy: Secondary | ICD-10-CM

## 2023-07-15 DIAGNOSIS — Z9884 Bariatric surgery status: Secondary | ICD-10-CM | POA: Insufficient documentation

## 2023-07-15 DIAGNOSIS — Z87891 Personal history of nicotine dependence: Secondary | ICD-10-CM | POA: Insufficient documentation

## 2023-07-15 HISTORY — PX: TRANSURETHRAL RESECTION OF PROSTATE: SHX73

## 2023-07-15 LAB — BASIC METABOLIC PANEL
Anion gap: 6 (ref 5–15)
BUN: 22 mg/dL (ref 8–23)
CO2: 25 mmol/L (ref 22–32)
Calcium: 8.6 mg/dL — ABNORMAL LOW (ref 8.9–10.3)
Chloride: 108 mmol/L (ref 98–111)
Creatinine, Ser: 1.13 mg/dL (ref 0.61–1.24)
GFR, Estimated: >60 mL/min (ref >60)
Glucose, Bld: 100 mg/dL — ABNORMAL HIGH (ref 70–99)
Potassium: 5.1 mmol/L (ref 3.5–5.1)
Sodium: 139 mmol/L (ref 135–145)

## 2023-07-15 LAB — CBC
HCT: 41.9 % (ref 39.0–52.0)
Hemoglobin: 13 g/dL (ref 13.0–17.0)
MCH: 28.1 pg (ref 26.0–34.0)
MCHC: 31 g/dL (ref 30.0–36.0)
MCV: 90.7 fL (ref 80.0–100.0)
Platelets: 144 10*3/uL — ABNORMAL LOW (ref 150–400)
RBC: 4.62 MIL/uL (ref 4.22–5.81)
RDW: 14.7 % (ref 11.5–15.5)
WBC: 6 10*3/uL (ref 4.0–10.5)
nRBC: 0 % (ref 0.0–0.2)

## 2023-07-15 LAB — GLUCOSE, CAPILLARY
Glucose-Capillary: 104 mg/dL — ABNORMAL HIGH (ref 70–99)
Glucose-Capillary: 111 mg/dL — ABNORMAL HIGH (ref 70–99)
Glucose-Capillary: 157 mg/dL — ABNORMAL HIGH (ref 70–99)

## 2023-07-15 SURGERY — TURP (TRANSURETHRAL RESECTION OF PROSTATE)
Anesthesia: General

## 2023-07-15 MED ORDER — LEVOTHYROXINE SODIUM 200 MCG PO TABS
200.0000 ug | ORAL_TABLET | Freq: Every day | ORAL | Status: DC
  Administered 2023-07-16: 200 ug via ORAL
  Filled 2023-07-15: qty 1
  Filled 2023-07-15: qty 2

## 2023-07-15 MED ORDER — OXYCODONE HCL 5 MG/5ML PO SOLN: 5.0000 mg | Freq: Once | ORAL | Status: DC | PRN

## 2023-07-15 MED ORDER — MIDAZOLAM HCL 2 MG/2ML IJ SOLN
INTRAMUSCULAR | Status: DC | PRN
Start: 2023-07-15 — End: 2023-07-15
  Administered 2023-07-15: 2 mg via INTRAVENOUS

## 2023-07-15 MED ORDER — LIDOCAINE HCL (PF) 2 % IJ SOLN
INTRAMUSCULAR | Status: DC | PRN
  Administered 2023-07-15: 60 mg via INTRADERMAL

## 2023-07-15 MED ORDER — HYDROMORPHONE HCL 1 MG/ML IJ SOLN: 0.2500 mg | INTRAMUSCULAR | Status: DC | PRN

## 2023-07-15 MED ORDER — ONDANSETRON HCL 4 MG/2ML IJ SOLN
INTRAMUSCULAR | Status: DC | PRN
  Administered 2023-07-15: 4 mg via INTRAVENOUS

## 2023-07-15 MED ORDER — DIPHENHYDRAMINE HCL 50 MG/ML IJ SOLN: 12.5000 mg | Freq: Four times a day (QID) | INTRAMUSCULAR | Status: DC | PRN

## 2023-07-15 MED ORDER — ACETAMINOPHEN 325 MG PO TABS
ORAL_TABLET | ORAL | Status: AC
  Administered 2023-07-15: 650 mg via ORAL
  Filled 2023-07-15: qty 2

## 2023-07-15 MED ORDER — PANTOPRAZOLE SODIUM 40 MG PO TBEC
40.0000 mg | DELAYED_RELEASE_TABLET | Freq: Every day | ORAL | Status: DC
  Administered 2023-07-16: 40 mg via ORAL
  Filled 2023-07-15: qty 1

## 2023-07-15 MED ORDER — PROPOFOL 10 MG/ML IV BOLUS
INTRAVENOUS | Status: AC
  Filled 2023-07-15: qty 20

## 2023-07-15 MED ORDER — LACTATED RINGERS IV SOLN: INTRAVENOUS | Status: AC

## 2023-07-15 MED ORDER — CEFAZOLIN SODIUM-DEXTROSE 2-4 GM/100ML-% IV SOLN
2.0000 g | INTRAVENOUS | Status: DC
  Filled 2023-07-15: qty 100

## 2023-07-15 MED ORDER — LIDOCAINE HCL (PF) 2 % IJ SOLN
INTRAMUSCULAR | Status: AC
  Filled 2023-07-15: qty 5

## 2023-07-15 MED ORDER — SODIUM CHLORIDE 0.9 % IR SOLN
Status: DC | PRN
  Administered 2023-07-15 (×3): 3000 mL

## 2023-07-15 MED ORDER — BENAZEPRIL HCL 20 MG PO TABS
40.0000 mg | ORAL_TABLET | Freq: Every day | ORAL | Status: DC
  Administered 2023-07-16: 40 mg via ORAL
  Filled 2023-07-15: qty 2

## 2023-07-15 MED ORDER — LORAZEPAM 0.5 MG PO TABS
0.5000 mg | ORAL_TABLET | Freq: Every day | ORAL | Status: DC
  Administered 2023-07-15: 0.5 mg via ORAL
  Filled 2023-07-15: qty 1

## 2023-07-15 MED ORDER — AMISULPRIDE (ANTIEMETIC) 5 MG/2ML IV SOLN: 10.0000 mg | Freq: Once | INTRAVENOUS | Status: DC | PRN

## 2023-07-15 MED ORDER — FENTANYL CITRATE (PF) 100 MCG/2ML IJ SOLN
INTRAMUSCULAR | Status: DC | PRN
  Administered 2023-07-15: 50 ug via INTRAVENOUS

## 2023-07-15 MED ORDER — CHLORHEXIDINE GLUCONATE CLOTH 2 % EX PADS
6.0000 | MEDICATED_PAD | Freq: Every day | CUTANEOUS | Status: DC
  Administered 2023-07-15 – 2023-07-16 (×2): 6 via TOPICAL

## 2023-07-15 MED ORDER — DEXAMETHASONE SODIUM PHOSPHATE 10 MG/ML IJ SOLN
INTRAMUSCULAR | Status: AC
  Filled 2023-07-15: qty 1

## 2023-07-15 MED ORDER — OXYCODONE HCL 5 MG PO TABS: 5.0000 mg | ORAL_TABLET | Freq: Once | ORAL | Status: DC | PRN

## 2023-07-15 MED ORDER — FENTANYL CITRATE (PF) 100 MCG/2ML IJ SOLN
INTRAMUSCULAR | Status: AC
  Filled 2023-07-15: qty 2

## 2023-07-15 MED ORDER — ORAL CARE MOUTH RINSE: 15.0000 mL | Freq: Once | OROMUCOSAL | Status: AC

## 2023-07-15 MED ORDER — OXYCODONE-ACETAMINOPHEN 5-325 MG PO TABS: 1.0000 | ORAL_TABLET | ORAL | Status: DC | PRN

## 2023-07-15 MED ORDER — CHLORHEXIDINE GLUCONATE 0.12 % MT SOLN
15.0000 mL | Freq: Once | OROMUCOSAL | Status: AC
  Administered 2023-07-15: 15 mL via OROMUCOSAL

## 2023-07-15 MED ORDER — ONDANSETRON HCL 4 MG/2ML IJ SOLN: 4.0000 mg | Freq: Once | INTRAMUSCULAR | Status: DC | PRN

## 2023-07-15 MED ORDER — PROPOFOL 10 MG/ML IV BOLUS
INTRAVENOUS | Status: DC | PRN
  Administered 2023-07-15: 200 mg via INTRAVENOUS

## 2023-07-15 MED ORDER — DIPHENHYDRAMINE HCL 12.5 MG/5ML PO ELIX: 12.5000 mg | ORAL_SOLUTION | Freq: Four times a day (QID) | ORAL | Status: DC | PRN

## 2023-07-15 MED ORDER — MIDAZOLAM HCL 2 MG/2ML IJ SOLN
INTRAMUSCULAR | Status: AC
  Filled 2023-07-15: qty 2

## 2023-07-15 MED ORDER — ACETAMINOPHEN 500 MG PO TABS
1000.0000 mg | ORAL_TABLET | Freq: Once | ORAL | Status: AC
  Administered 2023-07-15: 1000 mg via ORAL
  Filled 2023-07-15: qty 2

## 2023-07-15 MED ORDER — DEXAMETHASONE SODIUM PHOSPHATE 10 MG/ML IJ SOLN
INTRAMUSCULAR | Status: DC | PRN
  Administered 2023-07-15: 5 mg via INTRAVENOUS

## 2023-07-15 MED ORDER — OXYCODONE-ACETAMINOPHEN 5-325 MG PO TABS: 1.0000 | ORAL_TABLET | ORAL | 0 refills | Status: DC | PRN

## 2023-07-15 MED ORDER — CEFAZOLIN SODIUM-DEXTROSE 3-4 GM/150ML-% IV SOLN
3.0000 g | INTRAVENOUS | Status: AC
  Administered 2023-07-15: 3 g via INTRAVENOUS
  Filled 2023-07-15: qty 150

## 2023-07-15 MED ORDER — ACETAMINOPHEN 325 MG PO TABS
650.0000 mg | ORAL_TABLET | ORAL | Status: DC | PRN
  Administered 2023-07-16: 650 mg via ORAL
  Filled 2023-07-15: qty 2

## 2023-07-15 MED ORDER — FLUTICASONE PROPIONATE 50 MCG/ACT NA SUSP: 2.0000 | Freq: Every evening | NASAL | Status: DC | PRN

## 2023-07-15 MED ORDER — METOPROLOL SUCCINATE ER 100 MG PO TB24
200.0000 mg | ORAL_TABLET | Freq: Every day | ORAL | Status: DC
  Administered 2023-07-16: 200 mg via ORAL
  Filled 2023-07-15: qty 2

## 2023-07-15 MED ORDER — PRAVASTATIN SODIUM 40 MG PO TABS
40.0000 mg | ORAL_TABLET | Freq: Every day | ORAL | Status: DC
Start: 2023-07-15 — End: 2023-07-16
  Administered 2023-07-15 – 2023-07-16 (×2): 40 mg via ORAL
  Filled 2023-07-15 (×2): qty 1

## 2023-07-15 MED ORDER — INSULIN ASPART 100 UNIT/ML IJ SOLN
0.0000 [IU] | INTRAMUSCULAR | Status: DC | PRN
Start: 2023-07-15 — End: 2023-07-15

## 2023-07-15 MED ORDER — OXYBUTYNIN CHLORIDE ER 5 MG PO TB24
10.0000 mg | ORAL_TABLET | Freq: Every day | ORAL | Status: DC
  Administered 2023-07-16: 10 mg via ORAL
  Filled 2023-07-15: qty 2

## 2023-07-15 MED ORDER — PHENYLEPHRINE 80 MCG/ML (10ML) SYRINGE FOR IV PUSH (FOR BLOOD PRESSURE SUPPORT)
PREFILLED_SYRINGE | INTRAVENOUS | Status: DC | PRN
Start: 2023-07-15 — End: 2023-07-15
  Administered 2023-07-15 (×5): 160 ug via INTRAVENOUS

## 2023-07-15 MED ORDER — SODIUM CHLORIDE 0.9 % IR SOLN
3000.0000 mL | Status: DC
  Administered 2023-07-15 – 2023-07-16 (×2): 3000 mL

## 2023-07-15 MED ORDER — ONDANSETRON HCL 4 MG/2ML IJ SOLN: 4.0000 mg | INTRAMUSCULAR | Status: DC | PRN

## 2023-07-15 MED ORDER — DOCUSATE SODIUM 100 MG PO CAPS
100.0000 mg | ORAL_CAPSULE | Freq: Two times a day (BID) | ORAL | Status: DC
  Administered 2023-07-15 – 2023-07-16 (×2): 100 mg via ORAL
  Filled 2023-07-15 (×2): qty 1

## 2023-07-15 MED ORDER — ZOLPIDEM TARTRATE 5 MG PO TABS: 5.0000 mg | ORAL_TABLET | Freq: Every evening | ORAL | Status: DC | PRN

## 2023-07-15 MED ORDER — PHENYLEPHRINE 80 MCG/ML (10ML) SYRINGE FOR IV PUSH (FOR BLOOD PRESSURE SUPPORT)
PREFILLED_SYRINGE | INTRAVENOUS | Status: AC
  Filled 2023-07-15: qty 10

## 2023-07-15 MED ORDER — ONDANSETRON HCL 4 MG/2ML IJ SOLN
INTRAMUSCULAR | Status: AC
  Filled 2023-07-15: qty 2

## 2023-07-15 MED ORDER — POLYETHYLENE GLYCOL 3350 17 G PO PACK: 17.0000 g | PACK | Freq: Every day | ORAL | Status: DC | PRN

## 2023-07-15 SURGICAL SUPPLY — 22 items
BAG URINE DRAIN 2000ML AR STRL (UROLOGICAL SUPPLIES) ×1 IMPLANT
BAG URO CATCHER STRL LF (MISCELLANEOUS) ×1 IMPLANT
CATH FOLEY 3WAY 30CC 22FR (CATHETERS) IMPLANT
CATH URTH STD 24FR FL 3W 2 (CATHETERS) IMPLANT
DEVICE STATLOCK FOLEY SWIVEL S (MISCELLANEOUS) IMPLANT
DRAPE FOOT SWITCH (DRAPES) ×1 IMPLANT
ELECT REM PT RETURN 15FT ADLT (MISCELLANEOUS) IMPLANT
GLOVE SURG LX STRL 7.5 STRW (GLOVE) ×1 IMPLANT
GOWN STRL REUS W/ TWL XL LVL3 (GOWN DISPOSABLE) ×1 IMPLANT
GUIDEWIRE STR DUAL SENSOR (WIRE) IMPLANT
HOLDER FOLEY CATH W/STRAP (MISCELLANEOUS) IMPLANT
IV CATH 14GX2 1/4 (CATHETERS) IMPLANT
KIT TURNOVER KIT A (KITS) IMPLANT
LOOP CUT BIPOLAR 24F LRG (ELECTROSURGICAL) IMPLANT
MANIFOLD NEPTUNE II (INSTRUMENTS) ×1 IMPLANT
PACK CYSTO (CUSTOM PROCEDURE TRAY) ×1 IMPLANT
PAD PREP 24X48 CUFFED NSTRL (MISCELLANEOUS) ×1 IMPLANT
SYR 30ML LL (SYRINGE) ×1 IMPLANT
SYR TOOMEY IRRIG 70ML (MISCELLANEOUS) ×1 IMPLANT
SYRINGE TOOMEY IRRIG 70ML (MISCELLANEOUS) ×1 IMPLANT
TUBING CONNECTING 10 (TUBING) ×1 IMPLANT
TUBING UROLOGY SET (TUBING) ×1 IMPLANT

## 2023-07-15 NOTE — Progress Notes (Signed)
 RT attempted to set up resmed CPAP for pt but pts mask does not fit machine. Pts mask will work with a dream station pt states he does not like the dream station and declined use for the night.    07/15/23 2317  BiPAP/CPAP/SIPAP  Reason BIPAP/CPAP not in use Other(comment) (pt declined use of machine for the night since his mask does not work w/our equipment)

## 2023-07-15 NOTE — Transfer of Care (Signed)
 Immediate Anesthesia Transfer of Care Note  Patient: Patrick Butler  Procedure(s) Performed: TURP (TRANSURETHRAL RESECTION OF PROSTATE)  Patient Location: PACU  Anesthesia Type:General  Level of Consciousness: awake, alert , and oriented  Airway & Oxygen Therapy: Patient Spontanous Breathing and Patient connected to face mask oxygen  Post-op Assessment: Report given to RN and Post -op Vital signs reviewed and stable  Post vital signs: Reviewed and stable  Last Vitals:  Vitals Value Taken Time  BP 142/84 07/15/23 1306  Temp    Pulse 89 07/15/23 1308  Resp 15 07/15/23 1308  SpO2 100 % 07/15/23 1308  Vitals shown include unfiled device data.  Last Pain:  Vitals:   07/15/23 1025  TempSrc:   PainSc: 0-No pain      Patients Stated Pain Goal: 6 (07/15/23 1012)  Complications: No notable events documented.

## 2023-07-15 NOTE — Discharge Instructions (Signed)
 Activity:  You are encouraged to ambulate frequently (about every hour during waking hours) to help prevent blood clots from forming in your legs or lungs.   ? ?Diet: You should advance your diet as instructed by your physician.  It will be normal to have some bloating, nausea, and abdominal discomfort intermittently. ? ?Prescriptions:  You will be provided a prescription for pain medication to take as needed.  If your pain is not severe enough to require the prescription pain medication, you may take extra strength Tylenol instead which will have less side effects.  You should also take a prescribed stool softener to avoid straining with bowel movements as the prescription pain medication may constipate you. ? ?What to call us about: You should call the office [phone]) if you develop fever > 101 or develop persistent vomiting. Activity:  You are encouraged to ambulate frequently (about every hour during waking hours) to help prevent blood clots from forming in your legs or lungs.   ? ?You have a foley catheter in place. Return to clinic in 3 days for foley removal. ? ?

## 2023-07-15 NOTE — Anesthesia Postprocedure Evaluation (Signed)
 Anesthesia Post Note  Patient: Patrick Butler  Procedure(s) Performed: TURP (TRANSURETHRAL RESECTION OF PROSTATE)     Patient location during evaluation: PACU Anesthesia Type: General Level of consciousness: awake and alert Pain management: pain level controlled Vital Signs Assessment: post-procedure vital signs reviewed and stable Respiratory status: spontaneous breathing, nonlabored ventilation and respiratory function stable Cardiovascular status: blood pressure returned to baseline and stable Postop Assessment: no apparent nausea or vomiting Anesthetic complications: no  No notable events documented.  Last Vitals:  Vitals:   07/15/23 1430 07/15/23 1445  BP: (!) 150/87 (!) 143/89  Pulse: 90 90  Resp: 16 15  Temp:    SpO2: 98% 97%    Last Pain:  Vitals:   07/15/23 1445  TempSrc:   PainSc: 0-No pain                 Patrick Butler,W. [facility]MOND

## 2023-07-15 NOTE — Anesthesia Procedure Notes (Signed)
 Procedure Name: LMA Insertion Date/Time: 07/15/2023 12:17 PM  Performed by: Cleda Clarks, CRNAPre-anesthesia Checklist: Patient identified, Emergency Drugs available, Suction available and Patient being monitored Patient Re-evaluated:Patient Re-evaluated prior to induction Oxygen Delivery Method: Circle system utilized Preoxygenation: Pre-oxygenation with 100% oxygen Induction Type: IV induction Ventilation: Mask ventilation without difficulty LMA: LMA inserted LMA Size: 5.0 Number of attempts: 1 Placement Confirmation: positive ETCO2 Tube secured with: Tape Dental Injury: Teeth and Oropharynx as per pre-operative assessment

## 2023-07-15 NOTE — H&P (Signed)
 Urology Preoperative H&P   Chief Complaint: BPH  History of Present Illness: Patrick Butler is a 63 y.o. male with BPH here for TURP. Denies fevers, chills, dysuria.    Past Medical History:  Diagnosis Date   Anxiety    Arthritis    right knee   Cancer (HCC) 2005   tonsils/Chemo and radiation   Dysphagia 08/2014   Full dentures    GERD (gastroesophageal reflux disease)    Headache    History of chemotherapy    History of radiation therapy    neck   Hyperlipidemia    Hypertension    states under control with med., has been on med. ~ 5 yr.   Hypothyroidism    Non-insulin dependent type 2 diabetes mellitus (HCC)    Posterior neck pain    Seasonal allergies    Sleep apnea    uses CPAP nightly    Past Surgical History:  Procedure Laterality Date   COLONOSCOPY  2012   COLONOSCOPY WITH PROPOFOL N/A 10/04/2015   Procedure: COLONOSCOPY WITH PROPOFOL;  Surgeon: Sherrilyn Rist, MD;  Location: [facility];  Service: Gastroenterology;  Laterality: N/A;   DIRECT LARYNGOSCOPY N/A 08/20/2014   Procedure: DIRECT LARYNGOSCOPY;  Surgeon: Drema Halon, MD;  Location: [facility];  Service: ENT;  Laterality: N/A;   DIRECT LARYNGOSCOPY N/A 08/21/2016   Procedure: DIRECT LARYNGOSCOPY;  Surgeon: Drema Halon, MD;  Location: [facility] OR;  Service: ENT;  Laterality: N/A;   DIRECT LARYNGOSCOPY N/A 03/11/2020   Procedure: DIRECT LARYNGOSCOPY;  Surgeon: Drema Halon, MD;  Location: [facility];  Service: ENT;  Laterality: N/A;   ESOPHAGEAL DILATION N/A 03/11/2020   Procedure: INSERTION OF GUIDE WIRE FOLLOWED BY DILATION OVER GUIDE WIRE;  Surgeon: Drema Halon, MD;  Location: [facility];  Service: ENT;  Laterality: N/A;   ESOPHAGOSCOPY WITH DILITATION N/A 08/20/2014   Procedure: ESOPHAGOSCOPY WITH DILATATION;  Surgeon: Drema Halon, MD;  Location: [facility];  Service: ENT;  Laterality: N/A;   ESOPHAGOSCOPY  WITH DILITATION N/A 08/21/2016   Procedure: ESOPHAGOSCOPY WITH DILITATION;  Surgeon: Drema Halon, MD;  Location: [facility];  Service: ENT;  Laterality: N/A;   KNEE ARTHROPLASTY Right    KNEE ARTHROSCOPY Right    x 3   KNEE ARTHROSCOPY Left 10/28/2015   Procedure: ARTHROSCOPY KNEE;  Surgeon: Jodi Geralds, MD;  Location: [facility];  Service: Orthopedics;  Laterality: Left;  left knee partial medial meniscectomy and patellofemoral chondroplasty   LAPAROSCOPIC GASTRIC BYPASS  2004   LUMBAR LAMINECTOMY/DECOMPRESSION MICRODISCECTOMY  05/24/2011   Procedure: LUMBAR LAMINECTOMY/DECOMPRESSION MICRODISCECTOMY;  Surgeon: Carmela Hurt, MD;  Location: [facility];  Service: Neurosurgery;  Laterality: Right;  RIGHT L5-S1 Laminectomy, discectomy   LYMPHADENECTOMY Right    cervical   MULTIPLE TOOTH EXTRACTIONS     PORT-A-CATH REMOVAL     PORTACATH PLACEMENT     REPLACEMENT TOTAL KNEE Right 03/22/2004   SHOULDER ARTHROSCOPY Left    SHOULDER OPEN ROTATOR CUFF REPAIR Right     Allergies:  Allergies  Allergen Reactions   Sulfa Antibiotics Rash    Family History  Problem Relation Age of Onset   Hypertension Mother    Breast cancer Mother    Coronary artery disease Mother    Diabetes Father    Stroke Father 37   Dementia Father    Heart attack Maternal Uncle         X 2; both pre  55   Colon cancer Paternal Uncle    Brain cancer Paternal Uncle    Diabetes Maternal Grandfather    Hypertension Maternal Grandfather        ? metastatic; ? primary   Esophageal cancer Neg Hx    Stomach cancer Neg Hx    Colon polyps Neg Hx    Rectal cancer Neg Hx     Social History:  reports that he quit smoking about 20 years ago. His smoking use included cigarettes. He has been exposed to tobacco smoke. He has never used smokeless tobacco. He reports current alcohol use of about 3.0 - 4.0 standard drinks of alcohol per week. He reports that he does not use drugs.  ROS: A complete review of systems was  performed.  All systems are negative except for pertinent findings as noted.  Physical Exam:  Vital signs in last 24 hours: Temp:  [98.1 F (36.7 C)] 98.1 F (36.7 C) (03/10 1012) Pulse Rate:  [98] 98 (03/10 1012) Resp:  [18] 18 (03/10 1012) BP: (161)/(83) 161/83 (03/10 1012) SpO2:  [96 %] 96 % (03/10 1012) Weight:  [153.3 kg] 153.3 kg (03/10 1012) Constitutional:  Alert and oriented, No acute distress Cardiovascular: Regular rate and rhythm Respiratory: Normal respiratory effort, Lungs clear bilaterally GI: Abdomen is soft, nontender, nondistended, no abdominal masses GU: No CVA tenderness Lymphatic: No lymphadenopathy Neurologic: Grossly intact, no focal deficits Psychiatric: Normal mood and affect  Laboratory Data:  Recent Labs    07/12/23 1030  WBC 8.8  HGB 14.1  HCT 44.8  PLT 171    Recent Labs    07/12/23 1030  NA 137  K 4.8  CL 101  GLUCOSE 100*  BUN 20  CALCIUM 9.1  CREATININE 1.08     Results for orders placed or performed during the hospital encounter of 07/15/23 (from the past 24 hours)  Glucose, capillary     Status: Abnormal   Collection Time: 07/15/23 10:20 AM  Result Value Ref Range   Glucose-Capillary 104 (H) 70 - 99 mg/dL   No results found for this or any previous visit (from the past 240 hours).  Renal Function: Recent Labs    07/12/23 1030  CREATININE 1.08   Estimated Creatinine Clearance: 105.5 mL/min (by C-G formula based on SCr of 1.08 mg/dL).  Radiologic Imaging: No results found.  I independently reviewed the above imaging studies.  Assessment and Plan Patrick Butler is a 63 y.o. male with BPH here for TURP.   Matt R. Aaradhya Kysar MD 07/15/2023, 11:23 AM  [facility] Pager: [phone]): [phone]

## 2023-07-15 NOTE — Op Note (Signed)
 Operative Note  Preoperative diagnosis:  1.  BPH with bladder outlet obstruction  Postoperative diagnosis: 1.  BPH with bladder outlet obstruction  Procedure(s): 1.  Bipolar transurethral resection of prostate  Surgeon: Jettie Pagan, MD  Assistants:  None  Anesthesia:  General  Complications:  None  EBL:  10ml  Specimens: 1. Prostate chips ID Type Source Tests Collected by Time Destination  1 : prostate chips Tissue PATH Prostate TURP SURGICAL PATHOLOGY Jannifer Hick, MD 07/15/2023 1235    Drains/Catheters: 1.  22Fr 3 way catheter with 30ml water into balloon  Intraoperative findings:   Relatively short obstructing prostatic urethra, excellent resection with excellent hemostasis.  Indication:  Patrick Butler is a 63 y.o. male with BPH with bladder outlet obstruction presenting for transurethral resection of the prostate. After thorough discussion including all relevant risk benefits and alternatives, he presents today for a bipolar TURP.  Description of procedure: The indication, alternatives, benefits and risks were discussed with the patient and informed consent was obtained.  Patient was brought to the operating room table, positioned supine, secured with a safety strap.  Pneumatic compression devices were placed on the lower extremities.  After the administration of intravenous antibiotics and general anesthesia, the patient was repositioned into the dorsal lithotomy position.  All pressure points were carefully padded.  A rectal examination was performed confirming a smooth symmetric enlarged gland.  The genitalia were prepped and draped in standard sterile manner.  A timeout was completed, verifying the correct patient, surgical procedure and positioning prior to beginning the procedure.  Isotonic sodium chloride was used for irrigation.  A 26 French continuous-flow resectoscope sheath with the visual obturator and a 30 degree lens was advanced under direct vision into the  bladder.  The anterior urethra appeared normal in its entirety. The prostatic urethra had bilobar hyperplasia.  On cystoscopic evaluation, his bladder capacity appeared normal, the bladder wall was noted to expand symmetrically in all dimensions.  There were no tumors, stones or foreign bodies present. The bladder was trabeculated with normal-appearing mucosa.  Both ureteral orifices were in their normal anatomic positions with clear urinary reflux noted bilaterally.  The obturator was removed and replaced by the working element with a resection loop.  The location of the ureteral orifices and the prostatic configuration were again confirmed.  Starting at the bladder neck and proceeding distally to the verumontanum a transurethral section of the prostate was performed using bipolar using energy of 4 and 5 for cutting and coagulation, respectively.  The procedure began at the bladder neck at the 5 o'clock and 7 o'clock positions and carefully carried distally to the verumontanum, resecting the intervening prostatic adenoma.  Next the left lateral lobe was resected to the level of the transverse capsular fibers.  The identical procedure was performed on the right lobe.  Attention was then directed anteriorly and the resection was completed from the 10 o'clock to 2 o'clock positions.  All bleeding vessels were fulgurated achieving meticulous hemostasis.  The bladder was irrigated with a Toomey syringe, ensuring removal of all prostate chips which were sent to pathology for evaluation.  Having completed the resection and the chips removed, we again confirmed hemostasis with the loop with coagulating current.  Upon completion of the entire procedure, the bladder and posterior urethra were reexamined, confirming open prostatic urethra and bladder neck without evidence of bleeding or perforation.  Both ureteral orifices and the external sphincter were noted to be intact.  The resectoscope was withdrawn under direct  vision and a 22 French three-way Foley catheter with a 30 cc balloon was inserted into the bladder.  The balloon was inflated with 20 cc of sterile water.  After multiple manual irrigations ensuring clear return of the irrigant, the procedure was terminated.  The catheter was attached to a drainage bag and continuous bladder irrigation was started with normal saline.  The patient was positioned supine.  At the end of the procedure, all counts were correct.  Patient tolerated the procedure well and was taken to the recovery room satisfactory condition.  Plan: Continuous bladder irrigation overnight with gentle Foley traction.  Plan to discharge home tomorrow with Foley catheter in place and void trial in the office in 3 days.  Matt R. Daelan Gatt MD [facility]  Pager: [phone]

## 2023-07-16 ENCOUNTER — Encounter (HOSPITAL_COMMUNITY): Payer: Self-pay | Admitting: Urology

## 2023-07-16 DIAGNOSIS — Z9884 Bariatric surgery status: Secondary | ICD-10-CM | POA: Diagnosis not present

## 2023-07-16 DIAGNOSIS — K219 Gastro-esophageal reflux disease without esophagitis: Secondary | ICD-10-CM | POA: Diagnosis not present

## 2023-07-16 DIAGNOSIS — G4730 Sleep apnea, unspecified: Secondary | ICD-10-CM | POA: Diagnosis not present

## 2023-07-16 DIAGNOSIS — I10 Essential (primary) hypertension: Secondary | ICD-10-CM | POA: Diagnosis not present

## 2023-07-16 DIAGNOSIS — Z79890 Hormone replacement therapy (postmenopausal): Secondary | ICD-10-CM | POA: Diagnosis not present

## 2023-07-16 DIAGNOSIS — N401 Enlarged prostate with lower urinary tract symptoms: Secondary | ICD-10-CM | POA: Diagnosis not present

## 2023-07-16 DIAGNOSIS — N138 Other obstructive and reflux uropathy: Secondary | ICD-10-CM | POA: Diagnosis not present

## 2023-07-16 DIAGNOSIS — Z6841 Body Mass Index (BMI) 40.0 and over, adult: Secondary | ICD-10-CM | POA: Diagnosis not present

## 2023-07-16 DIAGNOSIS — F419 Anxiety disorder, unspecified: Secondary | ICD-10-CM | POA: Diagnosis not present

## 2023-07-16 DIAGNOSIS — E119 Type 2 diabetes mellitus without complications: Secondary | ICD-10-CM | POA: Diagnosis not present

## 2023-07-16 DIAGNOSIS — E6689 Other obesity not elsewhere classified: Secondary | ICD-10-CM | POA: Diagnosis not present

## 2023-07-16 DIAGNOSIS — Z79899 Other long term (current) drug therapy: Secondary | ICD-10-CM | POA: Diagnosis not present

## 2023-07-16 DIAGNOSIS — E039 Hypothyroidism, unspecified: Secondary | ICD-10-CM | POA: Diagnosis not present

## 2023-07-16 DIAGNOSIS — Z87891 Personal history of nicotine dependence: Secondary | ICD-10-CM | POA: Diagnosis not present

## 2023-07-16 DIAGNOSIS — R519 Headache, unspecified: Secondary | ICD-10-CM | POA: Diagnosis not present

## 2023-07-16 DIAGNOSIS — Z7984 Long term (current) use of oral hypoglycemic drugs: Secondary | ICD-10-CM | POA: Diagnosis not present

## 2023-07-16 LAB — CBC
HCT: 41.3 % (ref 39.0–52.0)
Hemoglobin: 12.9 g/dL — ABNORMAL LOW (ref 13.0–17.0)
MCH: 27.9 pg (ref 26.0–34.0)
MCHC: 31.2 g/dL (ref 30.0–36.0)
MCV: 89.2 fL (ref 80.0–100.0)
Platelets: 151 10*3/uL (ref 150–400)
RBC: 4.63 MIL/uL (ref 4.22–5.81)
RDW: 14.6 % (ref 11.5–15.5)
WBC: 11.5 10*3/uL — ABNORMAL HIGH (ref 4.0–10.5)
nRBC: 0 % (ref 0.0–0.2)

## 2023-07-16 LAB — BASIC METABOLIC PANEL
Anion gap: 9 (ref 5–15)
BUN: 21 mg/dL (ref 8–23)
CO2: 25 mmol/L (ref 22–32)
Calcium: 8.7 mg/dL — ABNORMAL LOW (ref 8.9–10.3)
Chloride: 102 mmol/L (ref 98–111)
Creatinine, Ser: 1.04 mg/dL (ref 0.61–1.24)
GFR, Estimated: >60 mL/min (ref >60)
Glucose, Bld: 120 mg/dL — ABNORMAL HIGH (ref 70–99)
Potassium: 4.8 mmol/L (ref 3.5–5.1)
Sodium: 136 mmol/L (ref 135–145)

## 2023-07-16 NOTE — Discharge Summary (Signed)
 Date of admission: 07/15/2023  Date of discharge: 07/16/2023  Admission diagnosis: BPH  Discharge diagnosis: BPH  Secondary diagnoses: None  History and Physical: For full details, please see admission history and physical. Briefly, Patrick Butler is a 63 y.o. year old patient with BPH who underwent TURP.   Hospital Course: The patient recovered in the usual expected fashion.  He had his diet advanced slowly.  Initially managed with IV pain control, then transitioned to PO meds when he was tolerating oral intake.  His labs were stable throughout the hospital course.  He was discharged to home on POD#1.  At the time of discharge the patient was tolerating a regular diet, passing flatus, ambulating, had adequate pain control and was agreeable to discharge.  Follow up as scheduled.    Laboratory values:  Recent Labs    07/15/23 1336 07/16/23 0351  HGB 13.0 12.9*  HCT 41.9 41.3   Recent Labs    07/15/23 1336 07/16/23 0351  CREATININE 1.13 1.04    Disposition: Home  Discharge instruction: The patient was instructed to be ambulatory but told to refrain from heavy lifting, strenuous activity, or driving.   Discharge medications:  Allergies as of 07/16/2023       Reactions   Sulfa Antibiotics Rash        Medication List     TAKE these medications    acetaminophen 500 MG tablet Commonly known as: TYLENOL Take 1,000-1,500 mg by mouth every 6 (six) hours as needed for moderate pain (pain score 4-6).   acetaminophen 650 MG CR tablet Commonly known as: TYLENOL Take 1,300 mg by mouth every 8 (eight) hours as needed for pain.   aspirin EC 81 MG tablet Take 81 mg by mouth every evening.   B-12 PO Take 1 tablet by mouth daily.   benazepril 40 MG tablet Commonly known as: LOTENSIN Take 1 tablet (40 mg total) by mouth daily.   fluticasone 50 MCG/ACT nasal spray Commonly known as: FLONASE Place 2 sprays into the nose as directed. At bedtime What changed:  when to take  this additional instructions   ketoconazole 2 % shampoo Commonly known as: NIZORAL Apply 1 application topically 2 (two) times a week.   levothyroxine 200 MCG tablet Commonly known as: SYNTHROID TAKE 1 TABLET BY MOUTH DAILY BEFORE BREAKFAST   LORazepam 0.5 MG tablet Commonly known as: ATIVAN TAKE 1 TABLET BY MOUTH DAILY AS NEEDED FOR ANXIETY   metoprolol 200 MG 24 hr tablet Commonly known as: TOPROL-XL Take 1 tablet (200 mg total) by mouth daily.   multivitamin with minerals tablet Take 1 tablet by mouth daily.   oxyCODONE-acetaminophen 5-325 MG tablet Commonly known as: Percocet Take 1 tablet by mouth every 4 (four) hours as needed for up to 18 doses for severe pain (pain score 7-10).   Ozempic (2 MG/DOSE) 8 MG/3ML Sopn Generic drug: Semaglutide (2 MG/DOSE) INJECT 2 MGS SUBCUTANEOUSLY ONCE WEEKLY   pantoprazole 40 MG tablet Commonly known as: PROTONIX TAKE 1 TABLET BY MOUTH DAILY   polyethylene glycol 17 g packet Commonly known as: MIRALAX / GLYCOLAX Take 17 g by mouth daily.   pravastatin 40 MG tablet Commonly known as: PRAVACHOL TAKE 1 TABLET BY MOUTH DAILY   PROBIOTIC PO Take 1 capsule by mouth daily.   tadalafil 5 MG tablet Commonly known as: CIALIS Take 5 mg by mouth daily.        Followup:   Follow-up Information     Jannifer Hick, MD Follow  up.   Specialty: Urology Why: Thursday AM as scheduled for void trial Contact information: [address] [place] [address] [phone]                 Matt R. Demoni Gergen MD [facility]  Pager: [phone]

## 2023-07-16 NOTE — Progress Notes (Signed)
 AVS given to patient and explained at the bedside. Medications and follow up appointments have been explained with pt verbalizing understanding. Foley care teaching provided at bedside with the pt and their wife returning teach back.

## 2023-07-16 NOTE — Plan of Care (Signed)
  Problem: Education: Goal: Knowledge of General Education information will improve Description: Including pain rating scale, medication(s)/side effects and non-pharmacologic comfort measures Outcome: Progressing   Problem: Coping: Goal: Level of anxiety will decrease Outcome: Progressing   Problem: Elimination: Goal: Will not experience complications related to bowel motility Outcome: Progressing   

## 2023-07-16 NOTE — Progress Notes (Signed)
   07/16/23 0826  TOC Brief Assessment  Insurance and Status Reviewed  Patient has primary care physician Yes  Home environment has been reviewed Resides at home with spouse  Prior level of function: Independent with ADLs at baseline  Prior/Current Home Services No current home services  Social Drivers of Health Review SDOH reviewed no interventions necessary  Readmission risk has been reviewed Yes  Transition of care needs no transition of care needs at this time

## 2023-07-17 LAB — SURGICAL PATHOLOGY

## 2023-07-22 ENCOUNTER — Other Ambulatory Visit: Payer: Self-pay | Admitting: Internal Medicine

## 2023-07-30 ENCOUNTER — Encounter: Payer: Self-pay | Admitting: Internal Medicine

## 2023-07-30 NOTE — Patient Instructions (Addendum)
      Blood work was ordered.       Medications changes include :   anti-histamine nasal spray for your allergies      Return in about 6 months (around 01/31/2024) for Physical Exam.

## 2023-07-30 NOTE — Progress Notes (Unsigned)
 Subjective:    Patient ID: Patrick Butler, male    DOB: Jun 19, 1960, 63 y.o.   MRN: [id number]     HPI Patrick Butler is here for follow up of his chronic medical problems.  BP at home - 73/60 - 157/98 -- cuff on BP monitor is too small - advised he get a new cuff  He is very active.    Medications and allergies reviewed with patient and updated if appropriate.  Current Outpatient Medications on File Prior to Visit  Medication Sig Dispense Refill   acetaminophen (TYLENOL) 500 MG tablet Take 1,000-1,500 mg by mouth every 6 (six) hours as needed for moderate pain (pain score 4-6).     acetaminophen (TYLENOL) 650 MG CR tablet Take 1,300 mg by mouth every 8 (eight) hours as needed for pain.     aspirin EC 81 MG tablet Take 81 mg by mouth every evening.      benazepril (LOTENSIN) 40 MG tablet TAKE 1 TABLET BY MOUTH DAILY 30 tablet 5   Cyanocobalamin (B-12 PO) Take 1 tablet by mouth daily.     fluticasone (FLONASE) 50 MCG/ACT nasal spray Place 2 sprays into the nose as directed. At bedtime (Patient taking differently: Place 2 sprays into the nose at bedtime.) 16 g 5   ketoconazole (NIZORAL) 2 % shampoo Apply 1 application topically 2 (two) times a week.     levothyroxine (SYNTHROID) 200 MCG tablet TAKE 1 TABLET BY MOUTH DAILY BEFORE BREAKFAST 90 tablet 1   LORazepam (ATIVAN) 0.5 MG tablet TAKE 1 TABLET BY MOUTH DAILY AS NEEDED FOR ANXIETY 30 tablet 0   metoprolol (TOPROL-XL) 200 MG 24 hr tablet TAKE 1 TABLET BY MOUTH DAILY 30 tablet 5   Multiple Vitamins-Minerals (MULTIVITAMIN WITH MINERALS) tablet Take 1 tablet by mouth daily.      OZEMPIC, 2 MG/DOSE, 8 MG/3ML SOPN INJECT 2 MGS SUBCUTANEOUSLY ONCE WEEKLY 9 mL 1   pantoprazole (PROTONIX) 40 MG tablet TAKE 1 TABLET BY MOUTH DAILY 90 tablet 2   polyethylene glycol (MIRALAX / GLYCOLAX) 17 g packet Take 17 g by mouth daily.     pravastatin (PRAVACHOL) 40 MG tablet TAKE 1 TABLET BY MOUTH DAILY 90 tablet 3   Probiotic Product (PROBIOTIC PO) Take 1  capsule by mouth daily.     tadalafil (CIALIS) 5 MG tablet Take 5 mg by mouth daily.     No current facility-administered medications on file prior to visit.     Review of Systems  Constitutional:  Negative for fever.  HENT:  Positive for rhinorrhea (clear) and sore throat.        Ear itchy  Respiratory:  Negative for cough, shortness of breath and wheezing.   Cardiovascular:  Negative for chest pain, palpitations and leg swelling.  Neurological:  Positive for headaches (allergy, stress related). Negative for light-headedness.       Objective:   Vitals:   07/31/23 0931  BP: (!) 150/84  Pulse: 100  Temp: 97.8 F (36.6 C)  SpO2: 94%   BP Readings from Last 3 Encounters:  07/31/23 (!) 150/84  07/16/23 112/67  06/19/23 (!) 142/86   Wt Readings from Last 3 Encounters:  07/31/23 (!) 334 lb 9.6 oz (151.8 kg)  07/15/23 (!) 338 lb (153.3 kg)  07/12/23 (!) 338 lb (153.3 kg)   Body mass index is 45.38 kg/m.    Physical Exam Constitutional:      General: He is not in acute distress.    Appearance: Normal  appearance. He is not ill-appearing.  HENT:     Head: Normocephalic and atraumatic.  Eyes:     Conjunctiva/sclera: Conjunctivae normal.  Cardiovascular:     Rate and Rhythm: Normal rate and regular rhythm.     Heart sounds: Normal heart sounds.  Pulmonary:     Effort: Pulmonary effort is normal. No respiratory distress.     Breath sounds: Normal breath sounds. No wheezing or rales.  Musculoskeletal:     Right lower leg: No edema.     Left lower leg: No edema.  Skin:    General: Skin is warm and dry.     Findings: No rash.  Neurological:     Mental Status: He is alert. Mental status is at baseline.  Psychiatric:        Mood and Affect: Mood normal.        Lab Results  Component Value Date   WBC 11.5 (H) 07/16/2023   HGB 12.9 (L) 07/16/2023   HCT 41.3 07/16/2023   PLT 151 07/16/2023   GLUCOSE 120 (H) 07/16/2023   CHOL 153 01/31/2023   TRIG 225.0 (H)  01/31/2023   HDL 44.40 01/31/2023   LDLDIRECT 105.0 07/13/2015   LDLCALC 64 01/31/2023   ALT 18 01/31/2023   AST 18 01/31/2023   NA 136 07/16/2023   K 4.8 07/16/2023   CL 102 07/16/2023   CREATININE 1.04 07/16/2023   BUN 21 07/16/2023   CO2 25 07/16/2023   TSH 1.16 01/31/2023   PSA 0.46 01/21/2015   HGBA1C 5.7 (H) 07/12/2023   MICROALBUR <0.7 01/31/2023     Assessment & Plan:    See Problem List for Assessment and Plan of chronic medical problems.

## 2023-07-31 ENCOUNTER — Ambulatory Visit: Payer: BC Managed Care – PPO | Admitting: Internal Medicine

## 2023-07-31 VITALS — BP 150/84 | HR 100 | Temp 97.8°F | Wt 334.6 lb

## 2023-07-31 DIAGNOSIS — E1141 Type 2 diabetes mellitus with diabetic mononeuropathy: Secondary | ICD-10-CM

## 2023-07-31 DIAGNOSIS — I10 Essential (primary) hypertension: Secondary | ICD-10-CM | POA: Diagnosis not present

## 2023-07-31 DIAGNOSIS — K219 Gastro-esophageal reflux disease without esophagitis: Secondary | ICD-10-CM | POA: Diagnosis not present

## 2023-07-31 DIAGNOSIS — F419 Anxiety disorder, unspecified: Secondary | ICD-10-CM | POA: Diagnosis not present

## 2023-07-31 DIAGNOSIS — E039 Hypothyroidism, unspecified: Secondary | ICD-10-CM

## 2023-07-31 DIAGNOSIS — Z7985 Long-term (current) use of injectable non-insulin antidiabetic drugs: Secondary | ICD-10-CM

## 2023-07-31 DIAGNOSIS — E7849 Other hyperlipidemia: Secondary | ICD-10-CM | POA: Diagnosis not present

## 2023-07-31 DIAGNOSIS — M542 Cervicalgia: Secondary | ICD-10-CM

## 2023-07-31 LAB — LIPID PANEL
Cholesterol: 127 mg/dL (ref 0–200)
HDL: 38.8 mg/dL — ABNORMAL LOW (ref >39.00)
LDL Cholesterol: 60 mg/dL (ref 0–99)
NonHDL: 88
Total CHOL/HDL Ratio: 3
Triglycerides: 141 mg/dL (ref 0.0–149.0)
VLDL: 28.2 mg/dL (ref 0.0–40.0)

## 2023-07-31 LAB — COMPREHENSIVE METABOLIC PANEL WITH GFR
ALT: 19 U/L (ref 0–53)
AST: 17 U/L (ref 0–37)
Albumin: 4.1 g/dL (ref 3.5–5.2)
Alkaline Phosphatase: 68 U/L (ref 39–117)
BUN: 23 mg/dL (ref 6–23)
CO2: 28 meq/L (ref 19–32)
Calcium: 9.3 mg/dL (ref 8.4–10.5)
Chloride: 103 meq/L (ref 96–112)
Creatinine, Ser: 1.05 mg/dL (ref 0.40–1.50)
GFR: 74.88 mL/min (ref >60.00)
Glucose, Bld: 110 mg/dL — ABNORMAL HIGH (ref 70–99)
Potassium: 4.8 meq/L (ref 3.5–5.1)
Sodium: 138 meq/L (ref 135–145)
Total Bilirubin: 0.4 mg/dL (ref 0.2–1.2)
Total Protein: 7.7 g/dL (ref 6.0–8.3)

## 2023-07-31 LAB — HEMOGLOBIN A1C: Hgb A1c MFr Bld: 6.2 % (ref 4.6–6.5)

## 2023-07-31 MED ORDER — AZELASTINE HCL 0.1 % NA SOLN: 1.0000 | Freq: Two times a day (BID) | NASAL | 5 refills | Status: DC

## 2023-07-31 NOTE — Assessment & Plan Note (Addendum)
 Chronic Blood pressure not well-controlled, but cuff he uses at home is too small - will get a new cuff No change in medication Low sodium diet Continue metoprolol XL 200 mg daily, continue benazepril 40 mg daily Continue to monitor BP at home

## 2023-07-31 NOTE — Assessment & Plan Note (Signed)
 Chronic Regular exercise and healthy diet encouraged Lab Results  Component Value Date   LDLCALC 64 01/31/2023   Check lipids Continue pravastatin 40 mg daily

## 2023-07-31 NOTE — Assessment & Plan Note (Signed)
 Chronic  Lab Results  Component Value Date   HGBA1C 5.7 (H) 07/12/2023   Sugars well controlled Check A1c  Continue Ozempic 2 mg weekly Stressed regular exercise, diabetic diet

## 2023-07-31 NOTE — Assessment & Plan Note (Signed)
 Chronic GERD controlled Continue pantoprazole 40 mg daily

## 2023-07-31 NOTE — Assessment & Plan Note (Addendum)
 Chronic Lower back, upper back pain Has seen neurosurgery  Intermittent nerve pain up neck and into head, intermittent numbness in fingers right hand May need to return to see orthopedics if pain does not improve

## 2023-07-31 NOTE — Assessment & Plan Note (Signed)
Chronic Euthyroid Last TSH within normal range Continue levothyroxine 200 mcg daily

## 2023-07-31 NOTE — Assessment & Plan Note (Signed)
Chronic Controlled, Stable Continue lorazepam 0.5 mg daily as needed, which is infrequent

## 2023-08-01 ENCOUNTER — Encounter: Payer: Self-pay | Admitting: Internal Medicine

## 2023-08-01 LAB — TSH: TSH: 0.45 u[IU]/mL (ref 0.35–5.50)

## 2023-08-16 ENCOUNTER — Telehealth (HOSPITAL_BASED_OUTPATIENT_CLINIC_OR_DEPARTMENT_OTHER): Payer: Self-pay

## 2023-08-16 DIAGNOSIS — G4733 Obstructive sleep apnea (adult) (pediatric): Secondary | ICD-10-CM

## 2023-08-16 NOTE — Telephone Encounter (Signed)
  I have faxed this today.  Copied from CRM [id number]. Topic: General - Other >> Aug 16, 2023 10:47 AM Adele Barthel wrote: Reason for CRM:   Patient is calling in regards to his CPAP equipment. He is trying to get supplies through [facility]; however, they need a new order. They sent a request a month ago, and then another request yesterday. Patient advised he would not be able to get new supplies until the order is received, and his CPAP hose is currently broken and taped together.  CB#  [phone]

## 2023-08-29 DIAGNOSIS — N390 Urinary tract infection, site not specified: Secondary | ICD-10-CM | POA: Diagnosis not present

## 2023-08-29 DIAGNOSIS — R3915 Urgency of urination: Secondary | ICD-10-CM | POA: Diagnosis not present

## 2023-08-29 DIAGNOSIS — B9689 Other specified bacterial agents as the cause of diseases classified elsewhere: Secondary | ICD-10-CM | POA: Diagnosis not present

## 2023-09-02 ENCOUNTER — Ambulatory Visit (AMBULATORY_SURGERY_CENTER)

## 2023-09-02 VITALS — Ht 72.0 in | Wt 330.0 lb

## 2023-09-02 DIAGNOSIS — Z860100 Personal history of colon polyps, unspecified: Secondary | ICD-10-CM

## 2023-09-02 MED ORDER — PEG 3350-KCL-NA BICARB-NACL 420 G PO SOLR: 4000.0000 mL | Freq: Once | ORAL | 0 refills | Status: AC

## 2023-09-02 NOTE — Progress Notes (Signed)
 No egg or soy allergy known to patient  No issues known to pt with past sedation with any surgeries or procedures Patient denies ever being told they had issues or difficulty with intubation  No FH of Malignant Hyperthermia Pt is not on diet pills/ Does take Ozempic , hold instructions provided Pt is not on  home 02  Pt is not on blood thinners  Pt denies issues with constipation  No A fib or A flutter Have any cardiac testing pending--No Pt can ambulate  Pt denies use of chewing tobacco Discussed diabetic I weight loss medication holds Discussed NSAID holds Checked BMI Pt instructed to use Single[employer] or GoodRx for a price reduction on prep  Patient's chart reviewed by Rogena Class CNRA prior to previsit and patient appropriate for the [facility].  Pre visit completed and red dot placed by patient's name on their procedure day (on provider's schedule).

## 2023-09-10 ENCOUNTER — Encounter: Payer: Self-pay | Admitting: Gastroenterology

## 2023-09-13 ENCOUNTER — Encounter (HOSPITAL_COMMUNITY): Payer: Self-pay

## 2023-09-20 ENCOUNTER — Encounter: Payer: Self-pay | Admitting: Gastroenterology

## 2023-09-20 ENCOUNTER — Ambulatory Visit: Admitting: Gastroenterology

## 2023-09-20 VITALS — BP 135/85 | HR 75 | Temp 98.2°F | Resp 16 | Ht 72.0 in | Wt 330.0 lb

## 2023-09-20 DIAGNOSIS — D123 Benign neoplasm of transverse colon: Secondary | ICD-10-CM | POA: Diagnosis not present

## 2023-09-20 DIAGNOSIS — K621 Rectal polyp: Secondary | ICD-10-CM

## 2023-09-20 DIAGNOSIS — K648 Other hemorrhoids: Secondary | ICD-10-CM | POA: Diagnosis not present

## 2023-09-20 DIAGNOSIS — Z860100 Personal history of colon polyps, unspecified: Secondary | ICD-10-CM

## 2023-09-20 DIAGNOSIS — Z860101 Personal history of adenomatous and serrated colon polyps: Secondary | ICD-10-CM

## 2023-09-20 DIAGNOSIS — D128 Benign neoplasm of rectum: Secondary | ICD-10-CM

## 2023-09-20 DIAGNOSIS — K635 Polyp of colon: Secondary | ICD-10-CM | POA: Diagnosis not present

## 2023-09-20 DIAGNOSIS — Z1211 Encounter for screening for malignant neoplasm of colon: Secondary | ICD-10-CM

## 2023-09-20 MED ORDER — SODIUM CHLORIDE 0.9 % IV SOLN: 500.0000 mL | Freq: Once | INTRAVENOUS | Status: DC

## 2023-09-20 NOTE — Op Note (Signed)
 [facility] Patient Name: Patrick Butler Procedure Date: 09/20/2023 9:44 AM MRN: [id number] Endoscopist: Ace Abu L. Dominic Friendly , MD, [id number] Age: 63 Referring MD:  Date of Birth: Jun 11, 1960 Gender: Male Account #: [id number] Procedure:                Colonoscopy Indications:              Personal history of colonic polyps                           March 2024 -3 tubular adenomas, fair prep                           January 2023 -12 polyps, mostly tubular adenoma and                            SSP                           Single tubular adenoma 2017 Medicines:                Monitored Anesthesia Care Procedure:                Pre-Anesthesia Assessment:                           - Prior to the procedure, a History and Physical                            was performed, and patient medications and                            allergies were reviewed. The patient's tolerance of                            previous anesthesia was also reviewed. The risks                            and benefits of the procedure and the sedation                            options and risks were discussed with the patient.                            All questions were answered, and informed consent                            was obtained. Prior Anticoagulants: The patient has                            taken no anticoagulant or antiplatelet agents. ASA                            Grade Assessment: III - A patient with severe  systemic disease. After reviewing the risks and                            benefits, the patient was deemed in satisfactory                            condition to undergo the procedure.                           After obtaining informed consent, the colonoscope                            was passed under direct vision. Throughout the                            procedure, the patient's blood pressure, pulse, and                            oxygen saturations were  monitored continuously. The                            CF [id number] #[id number] was introduced through the anus                            and advanced to the the cecum, identified by                            appendiceal orifice and ileocecal valve. The                            colonoscopy was performed without difficulty. The                            patient tolerated the procedure well. The quality                            of the bowel preparation was good after lavage. The                            ileocecal valve, appendiceal orifice, and rectum                            were photographed. The bowel preparation used was                            GoLYTELY . Scope In: 9:49:11 AM Scope Out: 10:10:06 AM Scope Withdrawal Time: 0 hours 15 minutes 50 seconds  Total Procedure Duration: 0 hours 20 minutes 55 seconds  Findings:                 The perianal and digital rectal examinations were                            normal.  Repeat examination of right colon under NBI                            performed.                           A localized area of scarred mucosa was found at the                            splenic flexure and descending colon. (Previously                            described)                           Two sessile polyps were found in the transverse                            colon. The polyps were diminutive in size. These                            polyps were removed with a cold snare. Resection                            and retrieval were complete. (Jar 1)                           A diminutive polyp was found in the distal rectum.                            The polyp was semi-sessile. The polyp was removed                            with a cold snare. Resection and retrieval were                            complete. (Jar 2)                           Internal hemorrhoids were found.                           The exam was otherwise without  abnormality on                            direct and retroflexion views. Complications:            No immediate complications. Estimated Blood Loss:     Estimated blood loss was minimal. Impression:               - Scarred mucosa at the splenic flexure and                            descending colon. Possible remnant of a previously  active colitis.                           - Two diminutive polyps in the transverse colon,                            removed with a cold snare. Resected and retrieved.                           - One diminutive polyp in the distal rectum,                            removed with a cold snare. Resected and retrieved.                           - Internal hemorrhoids.                           - The examination was otherwise normal on direct                            and retroflexion views. Recommendation:           - Patient has a contact number available for                            emergencies. The signs and symptoms of potential                            delayed complications were discussed with the                            patient. Return to normal activities tomorrow.                            Written discharge instructions were provided to the                            patient.                           - Resume previous diet.                           - Continue present medications.                           - Await pathology results.                           - Repeat colonoscopy is recommended for                            surveillance. The colonoscopy date will be                            determined after pathology results  from today's                            exam become available for review. (Prep was better                            on this exam-PEG prep for all future colonoscopies) Ace Abu L. Dominic Friendly, MD 09/20/2023 10:19:36 AM This report has been signed electronically.

## 2023-09-20 NOTE — Progress Notes (Signed)
 VS by [facility]  Pt's states no medical or surgical changes since previsit or office visit.

## 2023-09-20 NOTE — Progress Notes (Signed)
 Pt A/O x 3, gd SR's, pleased with anesthesia, report to RN

## 2023-09-20 NOTE — Progress Notes (Signed)
 History and Physical:  This patient presents for endoscopic testing for: Encounter Diagnosis  Name Primary?   Hx of colonic polyps Yes    Surveillance colonoscopy for Hx colon polyps Multiple TA and SSP Jan 2023 and in 2017 Multiple TA Mar 2024 with fair bowel prep  Patient is otherwise without complaints or active issues today.   Past Medical History: Past Medical History:  Diagnosis Date   Anxiety    Arthritis    right knee   Cancer (HCC) 2005   tonsils/Chemo and radiation   Dysphagia 08/2014   Full dentures    GERD (gastroesophageal reflux disease)    Headache    History of chemotherapy    History of radiation therapy    neck   Hyperlipidemia    Hypertension    states under control with med., has been on med. ~ 5 yr.   Hypothyroidism    Non-insulin  dependent type 2 diabetes mellitus (HCC)    Posterior neck pain    Seasonal allergies    Sleep apnea    uses CPAP nightly     Past Surgical History: Past Surgical History:  Procedure Laterality Date   COLONOSCOPY  2012   COLONOSCOPY WITH PROPOFOL  N/A 10/04/2015   Procedure: COLONOSCOPY WITH PROPOFOL ;  Surgeon: Albertina Hugger, MD;  Location: [facility];  Service: Gastroenterology;  Laterality: N/A;   DIRECT LARYNGOSCOPY N/A 08/20/2014   Procedure: DIRECT LARYNGOSCOPY;  Surgeon: Prescott Brodie, MD;  Location: [facility];  Service: ENT;  Laterality: N/A;   DIRECT LARYNGOSCOPY N/A 08/21/2016   Procedure: DIRECT LARYNGOSCOPY;  Surgeon: Prescott Brodie, MD;  Location: [facility] OR;  Service: ENT;  Laterality: N/A;   DIRECT LARYNGOSCOPY N/A 03/11/2020   Procedure: DIRECT LARYNGOSCOPY;  Surgeon: Prescott Brodie, MD;  Location: [facility];  Service: ENT;  Laterality: N/A;   ESOPHAGEAL DILATION N/A 03/11/2020   Procedure: INSERTION OF GUIDE WIRE FOLLOWED BY DILATION OVER GUIDE WIRE;  Surgeon: Prescott Brodie, MD;  Location: [facility];  Service: ENT;  Laterality:  N/A;   ESOPHAGOSCOPY WITH DILITATION N/A 08/20/2014   Procedure: ESOPHAGOSCOPY WITH DILATATION;  Surgeon: Prescott Brodie, MD;  Location: [facility];  Service: ENT;  Laterality: N/A;   ESOPHAGOSCOPY WITH DILITATION N/A 08/21/2016   Procedure: ESOPHAGOSCOPY WITH DILITATION;  Surgeon: Prescott Brodie, MD;  Location: [facility] OR;  Service: ENT;  Laterality: N/A;   KNEE ARTHROPLASTY Right    KNEE ARTHROSCOPY Right    x 3   KNEE ARTHROSCOPY Left 10/28/2015   Procedure: ARTHROSCOPY KNEE;  Surgeon: Neil Balls, MD;  Location: [facility] OR;  Service: Orthopedics;  Laterality: Left;  left knee partial medial meniscectomy and patellofemoral chondroplasty   LAPAROSCOPIC GASTRIC BYPASS  2004   LUMBAR LAMINECTOMY/DECOMPRESSION MICRODISCECTOMY  05/24/2011   Procedure: LUMBAR LAMINECTOMY/DECOMPRESSION MICRODISCECTOMY;  Surgeon: Pasty Bongo, MD;  Location: [facility];  Service: Neurosurgery;  Laterality: Right;  RIGHT L5-S1 Laminectomy, discectomy   LYMPHADENECTOMY Right    cervical   MULTIPLE TOOTH EXTRACTIONS     PORT-A-CATH REMOVAL     PORTACATH PLACEMENT     REPLACEMENT TOTAL KNEE Right 03/22/2004   SHOULDER ARTHROSCOPY Left    SHOULDER OPEN ROTATOR CUFF REPAIR Right    TRANSURETHRAL RESECTION OF PROSTATE N/A 07/15/2023   Procedure: TURP (TRANSURETHRAL RESECTION OF PROSTATE);  Surgeon: Lahoma Pigg, MD;  Location: [facility];  Service: Urology;  Laterality: N/A;  60 MINUTE CASE    Allergies: Allergies  Allergen Reactions   Sulfa Antibiotics Rash    Outpatient Meds: Current Outpatient Medications  Medication Sig Dispense Refill   aspirin  EC 81 MG tablet Take 81 mg by mouth every evening.      benazepril  (LOTENSIN ) 40 MG tablet TAKE 1 TABLET BY MOUTH DAILY 30 tablet 5   Cyanocobalamin  (B-12 PO) Take 1 tablet by mouth daily.     fluticasone  (FLONASE ) 50 MCG/ACT nasal spray Place 2 sprays into the nose as directed. At bedtime (Patient taking differently: Place 2 sprays into the nose at  bedtime.) 16 g 5   fosfomycin (MONUROL) 3 g PACK Take 3 g by mouth daily.     ketoconazole (NIZORAL) 2 % shampoo Apply 1 application topically 2 (two) times a week.     levothyroxine  (SYNTHROID ) 200 MCG tablet TAKE 1 TABLET BY MOUTH DAILY BEFORE BREAKFAST 90 tablet 1   metoprolol  (TOPROL -XL) 200 MG 24 hr tablet TAKE 1 TABLET BY MOUTH DAILY 30 tablet 5   Multiple Vitamins-Minerals (MULTIVITAMIN WITH MINERALS) tablet Take 1 tablet by mouth daily.      pantoprazole  (PROTONIX ) 40 MG tablet TAKE 1 TABLET BY MOUTH DAILY 90 tablet 2   polyethylene glycol (MIRALAX  / GLYCOLAX ) 17 g packet Take 17 g by mouth daily.     pravastatin  (PRAVACHOL ) 40 MG tablet TAKE 1 TABLET BY MOUTH DAILY 90 tablet 3   Probiotic Product (PROBIOTIC PO) Take 1 capsule by mouth daily.     acetaminophen  (TYLENOL ) 500 MG tablet Take 1,000-1,500 mg by mouth every 6 (six) hours as needed for moderate pain (pain score 4-6).     acetaminophen  (TYLENOL ) 650 MG CR tablet Take 1,300 mg by mouth every 8 (eight) hours as needed for pain.     LORazepam  (ATIVAN ) 0.5 MG tablet TAKE 1 TABLET BY MOUTH DAILY AS NEEDED FOR ANXIETY 30 tablet 0   OZEMPIC , 2 MG/DOSE, 8 MG/3ML SOPN INJECT 2 MGS SUBCUTANEOUSLY ONCE WEEKLY 9 mL 1   tadalafil  (CIALIS ) 5 MG tablet Take 5 mg by mouth daily.     Current Facility-Administered Medications  Medication Dose Route Frequency Provider Last Rate Last Admin   0.9 %  sodium chloride  infusion  500 mL Intravenous Once Danis, Zaryan Yakubov L III, MD          ___________________________________________________________________ Objective   Exam:  BP 136/80   Pulse 91   Temp 98.2 F (36.8 C)   Ht 6' (1.829 m)   Wt (!) 330 lb (149.7 kg)   SpO2 95%   BMI 44.76 kg/m   CV: regular , S1/S2 Resp: clear to auscultation bilaterally, normal RR and effort noted GI: soft, no tenderness, with active bowel sounds.   Assessment: Encounter Diagnosis  Name Primary?   Hx of colonic polyps Yes      Plan: Colonoscopy   The benefits and risks of the planned procedure(s) were described in detail with the patient or (when appropriate) their health care proxy.  Risks were outlined as including, but not limited to, bleeding, infection, perforation, adverse medication reaction leading to cardiac or pulmonary decompensation, pancreatitis (if ERCP).  The limitation of incomplete mucosal visualization was also discussed.  No guarantees or warranties were given.  The patient is appropriate for an endoscopic procedure in the ambulatory setting.   - Lorella Roles, MD

## 2023-09-20 NOTE — Patient Instructions (Signed)

## 2023-09-20 NOTE — Progress Notes (Signed)
 Called to room to assist during endoscopic procedure.  Patient ID and intended procedure confirmed with present staff. Received instructions for my participation in the procedure from the performing physician.

## 2023-09-23 ENCOUNTER — Telehealth: Payer: Self-pay

## 2023-09-23 NOTE — Telephone Encounter (Signed)
 Left message

## 2023-09-24 LAB — SURGICAL PATHOLOGY

## 2023-09-25 ENCOUNTER — Ambulatory Visit: Payer: Self-pay | Admitting: Gastroenterology

## 2023-11-19 DIAGNOSIS — G4733 Obstructive sleep apnea (adult) (pediatric): Secondary | ICD-10-CM | POA: Diagnosis not present

## 2023-12-02 ENCOUNTER — Other Ambulatory Visit: Payer: Self-pay | Admitting: Internal Medicine

## 2023-12-03 DIAGNOSIS — R350 Frequency of micturition: Secondary | ICD-10-CM | POA: Diagnosis not present

## 2023-12-03 DIAGNOSIS — R3912 Poor urinary stream: Secondary | ICD-10-CM | POA: Diagnosis not present

## 2023-12-03 DIAGNOSIS — N401 Enlarged prostate with lower urinary tract symptoms: Secondary | ICD-10-CM | POA: Diagnosis not present

## 2023-12-12 ENCOUNTER — Other Ambulatory Visit: Payer: Self-pay | Admitting: Internal Medicine

## 2023-12-23 ENCOUNTER — Other Ambulatory Visit: Payer: Self-pay | Admitting: Internal Medicine

## 2024-01-13 ENCOUNTER — Other Ambulatory Visit: Payer: Self-pay | Admitting: Internal Medicine

## 2024-01-29 NOTE — Patient Instructions (Addendum)
 Flu and pneumonia vaccine given.    Blood work was ordered.       Medications changes include :   will start mounjaro  after you finish the last ozempic       Return in about 6 months (around 07/29/2024) for follow up.    Health Maintenance, Male Adopting a healthy lifestyle and getting preventive care are important in promoting health and wellness. Ask your health care provider about: The right schedule for you to have regular tests and exams. Things you can do on your own to prevent diseases and keep yourself healthy. What should I know about diet, weight, and exercise? Eat a healthy diet  Eat a diet that includes plenty of vegetables, fruits, low-fat dairy products, and lean protein. Do not eat a lot of foods that are high in solid fats, added sugars, or sodium. Maintain a healthy weight Body mass index (BMI) is a measurement that can be used to identify possible weight problems. It estimates body fat based on height and weight. Your health care provider can help determine your BMI and help you achieve or maintain a healthy weight. Get regular exercise Get regular exercise. This is one of the most important things you can do for your health. Most adults should: Exercise for at least 150 minutes each week. The exercise should increase your heart rate and make you sweat (moderate-intensity exercise). Do strengthening exercises at least twice a week. This is in addition to the moderate-intensity exercise. Spend less time sitting. Even light physical activity can be beneficial. Watch cholesterol and blood lipids Have your blood tested for lipids and cholesterol at 65 years of age, then have this test every 5 years. You may need to have your cholesterol levels checked more often if: Your lipid or cholesterol levels are high. You are older than 65 years of age. You are at high risk for heart disease. What should I know about cancer screening? Many types of cancers can be  detected early and may often be prevented. Depending on your health history and family history, you may need to have cancer screening at various ages. This may include screening for: Colorectal cancer. Prostate cancer. Skin cancer. Lung cancer. What should I know about heart disease, diabetes, and high blood pressure? Blood pressure and heart disease High blood pressure causes heart disease and increases the risk of stroke. This is more likely to develop in people who have high blood pressure readings or are overweight. Talk with your health care provider about your target blood pressure readings. Have your blood pressure checked: Every 3-5 years if you are 56-42 years of age. Every year if you are 75 years old or older. If you are between the ages of 87 and 9 and are a current or former smoker, ask your health care provider if you should have a one-time screening for abdominal aortic aneurysm (AAA). Diabetes Have regular diabetes screenings. This checks your fasting blood sugar level. Have the screening done: Once every three years after age 28 if you are at a normal weight and have a low risk for diabetes. More often and at a younger age if you are overweight or have a high risk for diabetes. What should I know about preventing infection? Hepatitis B If you have a higher risk for hepatitis B, you should be screened for this virus. Talk with your health care provider to find out if you are at risk for hepatitis B infection. Hepatitis C Blood testing is recommended for: Everyone  born from [birth year] through 1967. Anyone with known risk factors for hepatitis C. Sexually transmitted infections (STIs) You should be screened each year for STIs, including gonorrhea and chlamydia, if: You are sexually active and are younger than 65 years of age. You are older than 65 years of age and your health care provider tells you that you are at risk for this type of infection. Your sexual activity has changed  since you were last screened, and you are at increased risk for chlamydia or gonorrhea. Ask your health care provider if you are at risk. Ask your health care provider about whether you are at high risk for HIV. Your health care provider may recommend a prescription medicine to help prevent HIV infection. If you choose to take medicine to prevent HIV, you should first get tested for HIV. You should then be tested every 3 months for as long as you are taking the medicine. Follow these instructions at home: Alcohol use Do not drink alcohol if your health care provider tells you not to drink. If you drink alcohol: Limit how much you have to 0-2 drinks a day. Know how much alcohol is in your drink. In the [place] one drink equals one 12 oz bottle of beer (355 mL), one 5 oz glass of wine (148 mL), or one 1 oz glass of hard liquor (44 mL). Lifestyle Do not use any products that contain nicotine or tobacco. These products include cigarettes, chewing tobacco, and vaping devices, such as e-cigarettes. If you need help quitting, ask your health care provider. Do not use street drugs. Do not share needles. Ask your health care provider for help if you need support or information about quitting drugs. General instructions Schedule regular health, dental, and eye exams. Stay current with your vaccines. Tell your health care provider if: You often feel depressed. You have ever been abused or do not feel safe at home. Summary Adopting a healthy lifestyle and getting preventive care are important in promoting health and wellness. Follow your health care provider's instructions about healthy diet, exercising, and getting tested or screened for diseases. Follow your health care provider's instructions on monitoring your cholesterol and blood pressure. This information is not intended to replace advice given to you by your health care provider. Make sure you discuss any questions you have with your health care  provider. Document Revised: 09/12/2020 Document Reviewed: 09/12/2020 Elsevier Patient Education  2024 [employer].

## 2024-01-29 NOTE — Progress Notes (Unsigned)
 Subjective:    Patient ID: Patrick Butler, male    DOB: Mar 05, 1961, 63 y.o.   MRN: [id number]     HPI [name] is here for a physical exam and his chronic medical problems.   ? Plantar fasciitis in R heel.  It started 2 months.  He does wear good sneakers.  He does do some stretching.  He has a history of this in the past and it lasted 1 year.   Medications and allergies reviewed with patient and updated if appropriate.  Current Outpatient Medications on File Prior to Visit  Medication Sig Dispense Refill   acetaminophen  (TYLENOL ) 500 MG tablet Take 1,000-1,500 mg by mouth every 6 (six) hours as needed for moderate pain (pain score 4-6).     acetaminophen  (TYLENOL ) 650 MG CR tablet Take 1,300 mg by mouth every 8 (eight) hours as needed for pain.     aspirin  EC 81 MG tablet Take 81 mg by mouth every evening.      benazepril  (LOTENSIN ) 40 MG tablet TAKE 1 TABLET BY MOUTH DAILY 30 tablet 5   Cyanocobalamin  (B-12 PO) Take 1 tablet by mouth daily.     fluticasone  (FLONASE ) 50 MCG/ACT nasal spray Place 2 sprays into the nose as directed. At bedtime (Patient taking differently: Place 2 sprays into the nose at bedtime.) 16 g 5   ketoconazole (NIZORAL) 2 % shampoo Apply 1 application topically 2 (two) times a week.     levothyroxine  (SYNTHROID ) 200 MCG tablet TAKE 1 TABLET BY MOUTH DAILY BEFORE BREAKFAST 90 tablet 1   LORazepam  (ATIVAN ) 0.5 MG tablet TAKE 1 TABLET BY MOUTH DAILY AS NEEDED FOR ANXIETY 30 tablet 0   metoprolol  (TOPROL -XL) 200 MG 24 hr tablet TAKE 1 TABLET BY MOUTH DAILY 30 tablet 5   Multiple Vitamins-Minerals (MULTIVITAMIN WITH MINERALS) tablet Take 1 tablet by mouth daily.      OZEMPIC , 2 MG/DOSE, 8 MG/3ML SOPN INJECT 2 MGS SUBCUTANEOUSLY ONCE WEEKLY 9 mL 1   pantoprazole  (PROTONIX ) 40 MG tablet TAKE 1 TABLET BY MOUTH DAILY 90 tablet 2   polyethylene glycol (MIRALAX  / GLYCOLAX ) 17 g packet Take 17 g by mouth daily.     pravastatin  (PRAVACHOL ) 40 MG tablet TAKE 1 TABLET BY  MOUTH DAILY 90 tablet 3   tadalafil  (CIALIS ) 5 MG tablet Take 5 mg by mouth daily.     No current facility-administered medications on file prior to visit.    Review of Systems  Constitutional:  Negative for fever.  HENT:  Positive for postnasal drip.   Eyes:  Negative for visual disturbance.  Respiratory:  Positive for cough (coughs up phlegm in am). Negative for shortness of breath and wheezing.   Cardiovascular:  Negative for chest pain, palpitations and leg swelling (occ).  Gastrointestinal:  Positive for diarrhea (daily). Negative for abdominal pain, blood in stool and constipation.       No gerd - controlled  Genitourinary:  Negative for difficulty urinating, dysuria and hematuria.  Musculoskeletal:  Positive for arthralgias, back pain and neck pain.  Skin:  Negative for rash.  Neurological:  Positive for dizziness (occ) and headaches (from neck). Negative for light-headedness.  Psychiatric/Behavioral:  Negative for dysphoric mood. The patient is nervous/anxious (situational).        Objective:   Vitals:   01/30/24 0929  BP: 138/80  Pulse: 91  Temp: 97.9 F (36.6 C)  SpO2: 93%   Filed Weights   01/30/24 0929  Weight: (!) 334 lb (151.5 kg)  Body mass index is 45.3 kg/m.  BP Readings from Last 3 Encounters:  01/30/24 138/80  09/20/23 135/85  07/31/23 (!) 150/84    Wt Readings from Last 3 Encounters:  01/30/24 (!) 334 lb (151.5 kg)  09/20/23 (!) 330 lb (149.7 kg)  09/02/23 (!) 330 lb (149.7 kg)      Physical Exam Constitutional: He appears well-developed and well-nourished. No distress.  HENT:  Head: Normocephalic and atraumatic.  Right Ear: External ear normal.  Left Ear: External ear normal.  Normal ear canals and TM b/l  Mouth/Throat: Oropharynx is clear and moist. Eyes: Conjunctivae and EOM are normal.  Neck: Neck supple. No tracheal deviation present. No thyromegaly present.  No carotid bruit  Cardiovascular: Normal rate, regular rhythm, normal  heart sounds and intact distal pulses.   No murmur heard.  No lower extremity edema. Pulmonary/Chest: Effort normal and breath sounds normal. No respiratory distress. He has no wheezes. He has no rales.  Abdominal: Soft. He exhibits no distension. There is no tenderness.  Genitourinary: deferred  Lymphadenopathy:   He has no cervical adenopathy.  Skin: Skin is warm and dry. He is not diaphoretic.  Psychiatric: He has a normal mood and affect. His behavior is normal.         Assessment & Plan:   Physical exam: Screening blood work  ordered Exercise   active Weight  obese Substance abuse   none   Reviewed recommended immunizations.   Health Maintenance  Topic Date Due   Diabetic kidney evaluation - Urine ACR  09/02/2010   Pneumococcal Vaccine: 50+ Years (2 of 2 - PCV) 07/12/2017   FOOT EXAM  07/23/2019   Influenza Vaccine  12/06/2023   COVID-19 Vaccine (4 - 2025-26 season) 01/06/2024   OPHTHALMOLOGY EXAM  01/09/2024   HEMOGLOBIN A1C  01/31/2024   Diabetic kidney evaluation - eGFR measurement  07/30/2024   Colonoscopy  09/19/2025   DTaP/Tdap/Td (3 - Td or Tdap) 07/22/2028   Hepatitis C Screening  Completed   HIV Screening  Completed   Zoster Vaccines- Shingrix   Completed   Hepatitis B Vaccines 19-59 Average Risk  Aged Out   HPV VACCINES  Aged Out   Meningococcal B Vaccine  Aged Out     See Problem List for Assessment and Plan of chronic medical problems.

## 2024-01-30 ENCOUNTER — Encounter: Payer: Self-pay | Admitting: Internal Medicine

## 2024-01-30 ENCOUNTER — Ambulatory Visit (INDEPENDENT_AMBULATORY_CARE_PROVIDER_SITE_OTHER): Admitting: Internal Medicine

## 2024-01-30 VITALS — BP 138/80 | HR 91 | Temp 97.9°F | Ht 72.0 in | Wt 334.0 lb

## 2024-01-30 DIAGNOSIS — Z7985 Long-term (current) use of injectable non-insulin antidiabetic drugs: Secondary | ICD-10-CM

## 2024-01-30 DIAGNOSIS — E785 Hyperlipidemia, unspecified: Secondary | ICD-10-CM | POA: Diagnosis not present

## 2024-01-30 DIAGNOSIS — E039 Hypothyroidism, unspecified: Secondary | ICD-10-CM | POA: Diagnosis not present

## 2024-01-30 DIAGNOSIS — Z0000 Encounter for general adult medical examination without abnormal findings: Secondary | ICD-10-CM

## 2024-01-30 DIAGNOSIS — K219 Gastro-esophageal reflux disease without esophagitis: Secondary | ICD-10-CM

## 2024-01-30 DIAGNOSIS — I152 Hypertension secondary to endocrine disorders: Secondary | ICD-10-CM | POA: Diagnosis not present

## 2024-01-30 DIAGNOSIS — E1140 Type 2 diabetes mellitus with diabetic neuropathy, unspecified: Secondary | ICD-10-CM | POA: Diagnosis not present

## 2024-01-30 DIAGNOSIS — G4486 Cervicogenic headache: Secondary | ICD-10-CM | POA: Insufficient documentation

## 2024-01-30 DIAGNOSIS — E119 Type 2 diabetes mellitus without complications: Secondary | ICD-10-CM

## 2024-01-30 DIAGNOSIS — Z23 Encounter for immunization: Secondary | ICD-10-CM | POA: Diagnosis not present

## 2024-01-30 DIAGNOSIS — F419 Anxiety disorder, unspecified: Secondary | ICD-10-CM | POA: Diagnosis not present

## 2024-01-30 DIAGNOSIS — E1169 Type 2 diabetes mellitus with other specified complication: Secondary | ICD-10-CM

## 2024-01-30 DIAGNOSIS — E1159 Type 2 diabetes mellitus with other circulatory complications: Secondary | ICD-10-CM

## 2024-01-30 LAB — COMPREHENSIVE METABOLIC PANEL WITH GFR
ALT: 17 U/L (ref 0–53)
AST: 21 U/L (ref 0–37)
Albumin: 4.3 g/dL (ref 3.5–5.2)
Alkaline Phosphatase: 71 U/L (ref 39–117)
BUN: 22 mg/dL (ref 6–23)
CO2: 27 meq/L (ref 19–32)
Calcium: 9.6 mg/dL (ref 8.4–10.5)
Chloride: 103 meq/L (ref 96–112)
Creatinine, Ser: 1.28 mg/dL (ref 0.40–1.50)
GFR: 58.83 mL/min — ABNORMAL LOW (ref >60.00)
Glucose, Bld: 104 mg/dL — ABNORMAL HIGH (ref 70–99)
Potassium: 5.2 meq/L — ABNORMAL HIGH (ref 3.5–5.1)
Sodium: 138 meq/L (ref 135–145)
Total Bilirubin: 0.6 mg/dL (ref 0.2–1.2)
Total Protein: 7.8 g/dL (ref 6.0–8.3)

## 2024-01-30 LAB — LIPID PANEL
Cholesterol: 148 mg/dL (ref 0–200)
HDL: 42.9 mg/dL (ref >39.00)
LDL Cholesterol: 71 mg/dL (ref 0–99)
NonHDL: 105.49
Total CHOL/HDL Ratio: 3
Triglycerides: 171 mg/dL — ABNORMAL HIGH (ref 0.0–149.0)
VLDL: 34.2 mg/dL (ref 0.0–40.0)

## 2024-01-30 LAB — MICROALBUMIN / CREATININE URINE RATIO
Creatinine,U: 120.1 mg/dL
Microalb Creat Ratio: UNDETERMINED mg/g (ref 0.0–30.0)
Microalb, Ur: <0.7 mg/dL

## 2024-01-30 LAB — HEMOGLOBIN A1C: Hgb A1c MFr Bld: 6.3 % (ref 4.6–6.5)

## 2024-01-30 LAB — TSH: TSH: 2.98 u[IU]/mL (ref 0.35–5.50)

## 2024-01-30 MED ORDER — TIRZEPATIDE 5 MG/0.5ML ~~LOC~~ SOAJ: 5.0000 mg | SUBCUTANEOUS | Status: DC

## 2024-01-30 MED ORDER — BENAZEPRIL HCL 40 MG PO TABS: 40.0000 mg | ORAL_TABLET | Freq: Every day | ORAL | 2 refills | Status: AC

## 2024-01-30 MED ORDER — METOPROLOL SUCCINATE ER 200 MG PO TB24: 200.0000 mg | ORAL_TABLET | Freq: Every day | ORAL | 2 refills | Status: AC

## 2024-01-30 NOTE — Assessment & Plan Note (Signed)
Chronic Euthyroid Last TSH within normal range Continue levothyroxine 200 mcg daily

## 2024-01-30 NOTE — Assessment & Plan Note (Addendum)
 Chronic Associated with neuropathy  Lab Results  Component Value Date   HGBA1C 6.2 07/31/2023   Sugars well controlled Check A1c, urine albumin/creatinine ratio Continue Ozempic  2 mg weekly for now-when he completes his last dose of the Ozempic  he has at home we will switch him to Mounjaro  5 mg weekly-hopefully this will help more with weight loss Stressed regular exercise, diabetic diet Encouraged weight loss

## 2024-01-30 NOTE — Assessment & Plan Note (Signed)
Chronic Controlled, Stable Continue lorazepam 0.5 mg daily as needed, which is infrequent

## 2024-01-30 NOTE — Assessment & Plan Note (Signed)
 Having right sided headaches coming from his neck up to his forehead Likely cervicogenic in nature Symptomatic treatment with over-the-counter pain medications Can refer for further evaluation if pain persists or worsens

## 2024-01-30 NOTE — Assessment & Plan Note (Signed)
 Chronic GERD controlled Continue pantoprazole 40 mg daily

## 2024-01-30 NOTE — Assessment & Plan Note (Addendum)
 Chronic Continue ozempic  to 2 mg weekly until he completes what he has at home and then change to mounjaro  5 mg weekly Decrease intake - high protein diet, increase vegetables - decrease sugars, carbs

## 2024-01-30 NOTE — Assessment & Plan Note (Addendum)
 chronic Blood pressure borderline controlled Low sodium diet Continue metoprolol  XL 200 mg daily, continue benazepril  40 mg daily Continue to monitor BP at home Hopefully weight loss will improve BP CMP

## 2024-01-30 NOTE — Assessment & Plan Note (Addendum)
 All chronic Regular exercise and healthy diet encouraged Lab Results  Component Value Date   LDLCALC 60 07/31/2023   Check lipids, CMP Continue pravastatin  40 mg daily

## 2024-01-31 DIAGNOSIS — E119 Type 2 diabetes mellitus without complications: Secondary | ICD-10-CM | POA: Diagnosis not present

## 2024-01-31 DIAGNOSIS — H25013 Cortical age-related cataract, bilateral: Secondary | ICD-10-CM | POA: Diagnosis not present

## 2024-01-31 DIAGNOSIS — H2513 Age-related nuclear cataract, bilateral: Secondary | ICD-10-CM | POA: Diagnosis not present

## 2024-01-31 LAB — HM DIABETES EYE EXAM

## 2024-02-01 ENCOUNTER — Ambulatory Visit: Payer: Self-pay | Admitting: Internal Medicine

## 2024-02-03 DIAGNOSIS — R519 Headache, unspecified: Secondary | ICD-10-CM | POA: Diagnosis not present

## 2024-02-03 DIAGNOSIS — R0981 Nasal congestion: Secondary | ICD-10-CM | POA: Diagnosis not present

## 2024-02-19 ENCOUNTER — Encounter: Payer: Self-pay | Admitting: Internal Medicine

## 2024-02-20 MED ORDER — TIRZEPATIDE 5 MG/0.5ML ~~LOC~~ SOAJ: 5.0000 mg | SUBCUTANEOUS | 0 refills | Status: DC

## 2024-02-25 ENCOUNTER — Other Ambulatory Visit (HOSPITAL_COMMUNITY): Payer: Self-pay

## 2024-02-25 ENCOUNTER — Telehealth: Payer: Self-pay

## 2024-02-25 NOTE — Telephone Encounter (Signed)
 Pharmacy Patient Advocate Encounter   Received notification from Onbase that prior authorization for Mounjaro  5MG /0.5ML auto-injectors  is required/requested.   Insurance verification completed.   The patient is insured through [facility]   Per test claim: PA required; PA submitted to above mentioned insurance via Latent Key/confirmation #/[id number] Status is pending

## 2024-02-26 ENCOUNTER — Other Ambulatory Visit (HOSPITAL_COMMUNITY): Payer: Self-pay

## 2024-02-26 NOTE — Telephone Encounter (Signed)
 Pharmacy Patient Advocate Encounter  Received notification from [facility] that Prior Authorization for Mounjaro  5MG /0.5ML auto-injectors  has been APPROVED from 02/26/24 to 02/24/25. Ran test claim, Copay is $60. This test claim was processed through [facility]- copay amounts may vary at other pharmacies due to pharmacy/plan contracts, or as the patient moves through the different stages of their insurance plan.   PA #/Case ID/Reference #: [id number]

## 2024-02-28 ENCOUNTER — Other Ambulatory Visit (HOSPITAL_COMMUNITY): Payer: Self-pay

## 2024-03-04 ENCOUNTER — Telehealth: Payer: Self-pay

## 2024-03-04 NOTE — Telephone Encounter (Signed)
 Copied from CRM #[id number]. Topic: Clinical - Medication Prior Auth >> Mar 03, 2024  3:15 PM Brittany M wrote: Reason for CRM: Patient calling to get an update on PA for the Monjaro

## 2024-03-06 DIAGNOSIS — G4733 Obstructive sleep apnea (adult) (pediatric): Secondary | ICD-10-CM | POA: Diagnosis not present

## 2024-03-19 DIAGNOSIS — M545 Low back pain, unspecified: Secondary | ICD-10-CM | POA: Diagnosis not present

## 2024-03-19 DIAGNOSIS — M25552 Pain in left hip: Secondary | ICD-10-CM | POA: Diagnosis not present

## 2024-03-27 ENCOUNTER — Other Ambulatory Visit: Payer: Self-pay | Admitting: Internal Medicine

## 2024-04-08 NOTE — Telephone Encounter (Signed)
 Addressed.

## 2024-04-24 ENCOUNTER — Other Ambulatory Visit: Payer: Self-pay | Admitting: Internal Medicine

## 2024-05-21 ENCOUNTER — Other Ambulatory Visit: Payer: Self-pay | Admitting: Internal Medicine

## 2024-07-29 ENCOUNTER — Ambulatory Visit: Admitting: Internal Medicine

## 2024-07-31 ENCOUNTER — Ambulatory Visit (HOSPITAL_BASED_OUTPATIENT_CLINIC_OR_DEPARTMENT_OTHER): Admitting: Pulmonary Disease

## 2025-02-01 ENCOUNTER — Encounter: Admitting: Internal Medicine
